# Patient Record
Sex: Female | Born: 1945 | Race: Black or African American | Hispanic: No | Smoking: Current every day smoker
Health system: Southern US, Community
[De-identification: ages and names within clinical notes are randomized; demographics above are authoritative.]

## PROBLEM LIST (undated history)

## (undated) DIAGNOSIS — I700 Atherosclerosis of aorta: Secondary | ICD-10-CM

## (undated) DIAGNOSIS — M109 Gout, unspecified: Secondary | ICD-10-CM

## (undated) DIAGNOSIS — I77810 Thoracic aortic ectasia: Secondary | ICD-10-CM

## (undated) DIAGNOSIS — R911 Solitary pulmonary nodule: Secondary | ICD-10-CM

## (undated) DIAGNOSIS — E559 Vitamin D deficiency, unspecified: Secondary | ICD-10-CM

## (undated) DIAGNOSIS — K635 Polyp of colon: Secondary | ICD-10-CM

## (undated) DIAGNOSIS — R0602 Shortness of breath: Secondary | ICD-10-CM

## (undated) DIAGNOSIS — I442 Atrioventricular block, complete: Secondary | ICD-10-CM

## (undated) DIAGNOSIS — R0600 Dyspnea, unspecified: Secondary | ICD-10-CM

## (undated) DIAGNOSIS — Z87898 Personal history of other specified conditions: Secondary | ICD-10-CM

## (undated) DIAGNOSIS — Z950 Presence of cardiac pacemaker: Secondary | ICD-10-CM

## (undated) DIAGNOSIS — R011 Cardiac murmur, unspecified: Secondary | ICD-10-CM

## (undated) DIAGNOSIS — I6523 Occlusion and stenosis of bilateral carotid arteries: Secondary | ICD-10-CM

## (undated) DIAGNOSIS — Z7982 Long term (current) use of aspirin: Secondary | ICD-10-CM

## (undated) DIAGNOSIS — M48061 Spinal stenosis, lumbar region without neurogenic claudication: Secondary | ICD-10-CM

## (undated) DIAGNOSIS — I10 Essential (primary) hypertension: Secondary | ICD-10-CM

## (undated) DIAGNOSIS — M1990 Unspecified osteoarthritis, unspecified site: Secondary | ICD-10-CM

## (undated) DIAGNOSIS — R0609 Other forms of dyspnea: Secondary | ICD-10-CM

## (undated) DIAGNOSIS — H269 Unspecified cataract: Secondary | ICD-10-CM

## (undated) DIAGNOSIS — J309 Allergic rhinitis, unspecified: Secondary | ICD-10-CM

## (undated) DIAGNOSIS — K118 Other diseases of salivary glands: Secondary | ICD-10-CM

## (undated) DIAGNOSIS — I428 Other cardiomyopathies: Secondary | ICD-10-CM

## (undated) DIAGNOSIS — E785 Hyperlipidemia, unspecified: Secondary | ICD-10-CM

## (undated) DIAGNOSIS — C801 Malignant (primary) neoplasm, unspecified: Secondary | ICD-10-CM

## (undated) DIAGNOSIS — I2510 Atherosclerotic heart disease of native coronary artery without angina pectoris: Secondary | ICD-10-CM

## (undated) DIAGNOSIS — E041 Nontoxic single thyroid nodule: Secondary | ICD-10-CM

## (undated) DIAGNOSIS — K219 Gastro-esophageal reflux disease without esophagitis: Secondary | ICD-10-CM

## (undated) DIAGNOSIS — D649 Anemia, unspecified: Secondary | ICD-10-CM

## (undated) DIAGNOSIS — F17200 Nicotine dependence, unspecified, uncomplicated: Secondary | ICD-10-CM

## (undated) HISTORY — DX: Cardiac murmur, unspecified: R01.1

## (undated) HISTORY — PX: TUBAL LIGATION: SHX77

## (undated) HISTORY — PX: APPENDECTOMY: SHX54

## (undated) HISTORY — DX: Unspecified osteoarthritis, unspecified site: M19.90

## (undated) HISTORY — DX: Essential (primary) hypertension: I10

## (undated) HISTORY — DX: Hyperlipidemia, unspecified: E78.5

## (undated) HISTORY — PX: WISDOM TOOTH EXTRACTION: SHX21

## (undated) HISTORY — DX: Personal history of other specified conditions: Z87.898

---

## 2011-06-29 ENCOUNTER — Ambulatory Visit (INDEPENDENT_AMBULATORY_CARE_PROVIDER_SITE_OTHER): Payer: BC Managed Care – PPO | Admitting: Family Medicine

## 2011-06-29 ENCOUNTER — Encounter: Payer: Self-pay | Admitting: Family Medicine

## 2011-06-29 ENCOUNTER — Other Ambulatory Visit (HOSPITAL_COMMUNITY)
Admission: RE | Admit: 2011-06-29 | Discharge: 2011-06-29 | Disposition: A | Discharge status: Home / Self Care | Payer: Medicare Other | Source: Ambulatory Visit | Attending: Family Medicine | Admitting: Family Medicine

## 2011-06-29 VITALS — BP 101/77 | HR 78 | Ht 62.0 in | Wt 202.0 lb

## 2011-06-29 DIAGNOSIS — Z1159 Encounter for screening for other viral diseases: Secondary | ICD-10-CM | POA: Insufficient documentation

## 2011-06-29 DIAGNOSIS — Z87891 Personal history of nicotine dependence: Secondary | ICD-10-CM | POA: Insufficient documentation

## 2011-06-29 DIAGNOSIS — E669 Obesity, unspecified: Secondary | ICD-10-CM | POA: Insufficient documentation

## 2011-06-29 DIAGNOSIS — Z01419 Encounter for gynecological examination (general) (routine) without abnormal findings: Secondary | ICD-10-CM

## 2011-06-29 DIAGNOSIS — R011 Cardiac murmur, unspecified: Secondary | ICD-10-CM | POA: Insufficient documentation

## 2011-06-29 DIAGNOSIS — Z124 Encounter for screening for malignant neoplasm of cervix: Secondary | ICD-10-CM | POA: Insufficient documentation

## 2011-06-29 DIAGNOSIS — I10 Essential (primary) hypertension: Secondary | ICD-10-CM | POA: Insufficient documentation

## 2011-06-29 DIAGNOSIS — E7800 Pure hypercholesterolemia, unspecified: Secondary | ICD-10-CM | POA: Insufficient documentation

## 2011-06-29 DIAGNOSIS — M1990 Unspecified osteoarthritis, unspecified site: Secondary | ICD-10-CM | POA: Insufficient documentation

## 2011-06-29 NOTE — Patient Instructions (Signed)
Preventive Care for Adults, Female A healthy lifestyle and preventive care can promote health and wellness. Preventive health guidelines for women include the following key practices.  A routine yearly physical is a good way to check with your caregiver about your health and preventive screening. It is a chance to share any concerns and updates on your health, and to receive a thorough exam.   Visit your dentist for a routine exam and preventive care every 6 months. Brush your teeth twice a day and floss once a day. Good oral hygiene prevents tooth decay and gum disease.   The frequency of eye exams is based on your age, health, family medical history, use of contact lenses, and other factors. Follow your caregiver's recommendations for frequency of eye exams.   Eat a healthy diet. Foods like vegetables, fruits, whole grains, low-fat dairy products, and lean protein foods contain the nutrients you need without too many calories. Decrease your intake of foods high in solid fats, added sugars, and salt. Eat the right amount of calories for you.Get information about a proper diet from your caregiver, if necessary.   Regular physical exercise is one of the most important things you can do for your health. Most adults should get at least 150 minutes of moderate-intensity exercise (any activity that increases your heart rate and causes you to sweat) each week. In addition, most adults need muscle-strengthening exercises on 2 or more days a week.   Maintain a healthy weight. The body mass index (BMI) is a screening tool to identify possible weight problems. It provides an estimate of body fat based on height and weight. Your caregiver can help determine your BMI, and can help you achieve or maintain a healthy weight.For adults 20 years and older:   A BMI below 18.5 is considered underweight.   A BMI of 18.5 to 24.9 is normal.   A BMI of 25 to 29.9 is considered overweight.   A BMI of 30 and above is  considered obese.   Maintain normal blood lipids and cholesterol levels by exercising and minimizing your intake of saturated fat. Eat a balanced diet with plenty of fruit and vegetables. Blood tests for lipids and cholesterol should begin at age 20 and be repeated every 5 years. If your lipid or cholesterol levels are high, you are over 50, or you are at high risk for heart disease, you may need your cholesterol levels checked more frequently.Ongoing high lipid and cholesterol levels should be treated with medicines if diet and exercise are not effective.   If you smoke, find out from your caregiver how to quit. If you do not use tobacco, do not start.   If you are pregnant, do not drink alcohol. If you are breastfeeding, be very cautious about drinking alcohol. If you are not pregnant and choose to drink alcohol, do not exceed 1 drink per day. One drink is considered to be 12 ounces (355 mL) of beer, 5 ounces (148 mL) of wine, or 1.5 ounces (44 mL) of liquor.   Avoid use of street drugs. Do not share needles with anyone. Ask for help if you need support or instructions about stopping the use of drugs.   High blood pressure causes heart disease and increases the risk of stroke. Your blood pressure should be checked at least every 1 to 2 years. Ongoing high blood pressure should be treated with medicines if weight loss and exercise are not effective.   If you are 55 to 65   years old, ask your caregiver if you should take aspirin to prevent strokes.   Diabetes screening involves taking a blood sample to check your fasting blood sugar level. This should be done once every 3 years, after age 45, if you are within normal weight and without risk factors for diabetes. Testing should be considered at a younger age or be carried out more frequently if you are overweight and have at least 1 risk factor for diabetes.   Breast cancer screening is essential preventive care for women. You should practice "breast  self-awareness." This means understanding the normal appearance and feel of your breasts and may include breast self-examination. Any changes detected, no matter how small, should be reported to a caregiver. Women in their 20s and 30s should have a clinical breast exam (CBE) by a caregiver as part of a regular health exam every 1 to 3 years. After age 40, women should have a CBE every year. Starting at age 40, women should consider having a mammography (breast X-ray test) every year. Women who have a family history of breast cancer should talk to their caregiver about genetic screening. Women at a high risk of breast cancer should talk to their caregivers about having magnetic resonance imaging (MRI) and a mammography every year.   The Pap test is a screening test for cervical cancer. A Pap test can show cell changes on the cervix that might become cervical cancer if left untreated. A Pap test is a procedure in which cells are obtained and examined from the lower end of the uterus (cervix).   Women should have a Pap test starting at age 21.   Between ages 21 and 29, Pap tests should be repeated every 2 years.   Beginning at age 30, you should have a Pap test every 3 years as long as the past 3 Pap tests have been normal.   Some women have medical problems that increase the chance of getting cervical cancer. Talk to your caregiver about these problems. It is especially important to talk to your caregiver if a new problem develops soon after your last Pap test. In these cases, your caregiver may recommend more frequent screening and Pap tests.   The above recommendations are the same for women who have or have not gotten the vaccine for human papillomavirus (HPV).   If you had a hysterectomy for a problem that was not cancer or a condition that could lead to cancer, then you no longer need Pap tests. Even if you no longer need a Pap test, a regular exam is a good idea to make sure no other problems are  starting.   If you are between ages 65 and 70, and you have had normal Pap tests going back 10 years, you no longer need Pap tests. Even if you no longer need a Pap test, a regular exam is a good idea to make sure no other problems are starting.   If you have had past treatment for cervical cancer or a condition that could lead to cancer, you need Pap tests and screening for cancer for at least 20 years after your treatment.   If Pap tests have been discontinued, risk factors (such as a new sexual partner) need to be reassessed to determine if screening should be resumed.   The HPV test is an additional test that may be used for cervical cancer screening. The HPV test looks for the virus that can cause the cell changes on the cervix.   The cells collected during the Pap test can be tested for HPV. The HPV test could be used to screen women aged 30 years and older, and should be used in women of any age who have unclear Pap test results. After the age of 30, women should have HPV testing at the same frequency as a Pap test.   Colorectal cancer can be detected and often prevented. Most routine colorectal cancer screening begins at the age of 50 and continues through age 75. However, your caregiver may recommend screening at an earlier age if you have risk factors for colon cancer. On a yearly basis, your caregiver may provide home test kits to check for hidden blood in the stool. Use of a small camera at the end of a tube, to directly examine the colon (sigmoidoscopy or colonoscopy), can detect the earliest forms of colorectal cancer. Talk to your caregiver about this at age 50, when routine screening begins. Direct examination of the colon should be repeated every 5 to 10 years through age 75, unless early forms of pre-cancerous polyps or small growths are found.   Hepatitis C blood testing is recommended for all people born from 1945 through 1965 and any individual with known risks for hepatitis C.    Practice safe sex. Use condoms and avoid high-risk sexual practices to reduce the spread of sexually transmitted infections (STIs). STIs include gonorrhea, chlamydia, syphilis, trichomonas, herpes, HPV, and human immunodeficiency virus (HIV). Herpes, HIV, and HPV are viral illnesses that have no cure. They can result in disability, cancer, and death. Sexually active women aged 25 and younger should be checked for chlamydia. Older women with new or multiple partners should also be tested for chlamydia. Testing for other STIs is recommended if you are sexually active and at increased risk.   Osteoporosis is a disease in which the bones lose minerals and strength with aging. This can result in serious bone fractures. The risk of osteoporosis can be identified using a bone density scan. Women ages 65 and over and women at risk for fractures or osteoporosis should discuss screening with their caregivers. Ask your caregiver whether you should take a calcium supplement or vitamin D to reduce the rate of osteoporosis.   Menopause can be associated with physical symptoms and risks. Hormone replacement therapy is available to decrease symptoms and risks. You should talk to your caregiver about whether hormone replacement therapy is right for you.   Use sunscreen with sun protection factor (SPF) of 30 or more. Apply sunscreen liberally and repeatedly throughout the day. You should seek shade when your shadow is shorter than you. Protect yourself by wearing long sleeves, pants, a wide-brimmed hat, and sunglasses year round, whenever you are outdoors.   Once a month, do a whole body skin exam, using a mirror to look at the skin on your back. Notify your caregiver of new moles, moles that have irregular borders, moles that are larger than a pencil eraser, or moles that have changed in shape or color.   Stay current with required immunizations.   Influenza. You need a dose every fall (or winter). The composition of  the flu vaccine changes each year, so being vaccinated once is not enough.   Pneumococcal polysaccharide. You need 1 to 2 doses if you smoke cigarettes or if you have certain chronic medical conditions. You need 1 dose at age 65 (or older) if you have never been vaccinated.   Tetanus, diphtheria, pertussis (Tdap, Td). Get 1 dose of   Tdap vaccine if you are younger than age 65, are over 65 and have contact with an infant, are a healthcare worker, are pregnant, or simply want to be protected from whooping cough. After that, you need a Td booster dose every 10 years. Consult your caregiver if you have not had at least 3 tetanus and diphtheria-containing shots sometime in your life or have a deep or dirty wound.   HPV. You need this vaccine if you are a woman age 26 or younger. The vaccine is given in 3 doses over 6 months.   Measles, mumps, rubella (MMR). You need at least 1 dose of MMR if you were born in 1957 or later. You may also need a second dose.   Meningococcal. If you are age 19 to 21 and a first-year college student living in a residence hall, or have one of several medical conditions, you need to get vaccinated against meningococcal disease. You may also need additional booster doses.   Zoster (shingles). If you are age 60 or older, you should get this vaccine.   Varicella (chickenpox). If you have never had chickenpox or you were vaccinated but received only 1 dose, talk to your caregiver to find out if you need this vaccine.   Hepatitis A. You need this vaccine if you have a specific risk factor for hepatitis A virus infection or you simply wish to be protected from this disease. The vaccine is usually given as 2 doses, 6 to 18 months apart.   Hepatitis B. You need this vaccine if you have a specific risk factor for hepatitis B virus infection or you simply wish to be protected from this disease. The vaccine is given in 3 doses, usually over 6 months.  Preventive Services /  Frequency Ages 19 to 39  Blood pressure check.** / Every 1 to 2 years.   Lipid and cholesterol check.** / Every 5 years beginning at age 20.   Clinical breast exam.** / Every 3 years for women in their 20s and 30s.   Pap test.** / Every 2 years from ages 21 through 29. Every 3 years starting at age 30 through age 65 or 70 with a history of 3 consecutive normal Pap tests.   HPV screening.** / Every 3 years from ages 30 through ages 65 to 70 with a history of 3 consecutive normal Pap tests.   Hepatitis C blood test.** / For any individual with known risks for hepatitis C.   Skin self-exam. / Monthly.   Influenza immunization.** / Every year.   Pneumococcal polysaccharide immunization.** / 1 to 2 doses if you smoke cigarettes or if you have certain chronic medical conditions.   Tetanus, diphtheria, pertussis (Tdap, Td) immunization. / A one-time dose of Tdap vaccine. After that, you need a Td booster dose every 10 years.   HPV immunization. / 3 doses over 6 months, if you are 26 and younger.   Measles, mumps, rubella (MMR) immunization. / You need at least 1 dose of MMR if you were born in 1957 or later. You may also need a second dose.   Meningococcal immunization. / 1 dose if you are age 19 to 21 and a first-year college student living in a residence hall, or have one of several medical conditions, you need to get vaccinated against meningococcal disease. You may also need additional booster doses.   Varicella immunization.** / Consult your caregiver.   Hepatitis A immunization.** / Consult your caregiver. 2 doses, 6 to 18 months   apart.   Hepatitis B immunization.** / Consult your caregiver. 3 doses usually over 6 months.  Ages 40 to 64  Blood pressure check.** / Every 1 to 2 years.   Lipid and cholesterol check.** / Every 5 years beginning at age 20.   Clinical breast exam.** / Every year after age 40.   Mammogram.** / Every year beginning at age 40 and continuing for as  long as you are in good health. Consult with your caregiver.   Pap test.** / Every 3 years starting at age 30 through age 65 or 70 with a history of 3 consecutive normal Pap tests.   HPV screening.** / Every 3 years from ages 30 through ages 65 to 70 with a history of 3 consecutive normal Pap tests.   Fecal occult blood test (FOBT) of stool. / Every year beginning at age 50 and continuing until age 75. You may not need to do this test if you get a colonoscopy every 10 years.   Flexible sigmoidoscopy or colonoscopy.** / Every 5 years for a flexible sigmoidoscopy or every 10 years for a colonoscopy beginning at age 50 and continuing until age 75.   Hepatitis C blood test.** / For all people born from 1945 through 1965 and any individual with known risks for hepatitis C.   Skin self-exam. / Monthly.   Influenza immunization.** / Every year.   Pneumococcal polysaccharide immunization.** / 1 to 2 doses if you smoke cigarettes or if you have certain chronic medical conditions.   Tetanus, diphtheria, pertussis (Tdap, Td) immunization.** / A one-time dose of Tdap vaccine. After that, you need a Td booster dose every 10 years.   Measles, mumps, rubella (MMR) immunization. / You need at least 1 dose of MMR if you were born in 1957 or later. You may also need a second dose.   Varicella immunization.** / Consult your caregiver.   Meningococcal immunization.** / Consult your caregiver.   Hepatitis A immunization.** / Consult your caregiver. 2 doses, 6 to 18 months apart.   Hepatitis B immunization.** / Consult your caregiver. 3 doses, usually over 6 months.  Ages 65 and over  Blood pressure check.** / Every 1 to 2 years.   Lipid and cholesterol check.** / Every 5 years beginning at age 20.   Clinical breast exam.** / Every year after age 40.   Mammogram.** / Every year beginning at age 40 and continuing for as long as you are in good health. Consult with your caregiver.   Pap test.** /  Every 3 years starting at age 30 through age 65 or 70 with a 3 consecutive normal Pap tests. Testing can be stopped between 65 and 70 with 3 consecutive normal Pap tests and no abnormal Pap or HPV tests in the past 10 years.   HPV screening.** / Every 3 years from ages 30 through ages 65 or 70 with a history of 3 consecutive normal Pap tests. Testing can be stopped between 65 and 70 with 3 consecutive normal Pap tests and no abnormal Pap or HPV tests in the past 10 years.   Fecal occult blood test (FOBT) of stool. / Every year beginning at age 50 and continuing until age 75. You may not need to do this test if you get a colonoscopy every 10 years.   Flexible sigmoidoscopy or colonoscopy.** / Every 5 years for a flexible sigmoidoscopy or every 10 years for a colonoscopy beginning at age 50 and continuing until age 75.   Hepatitis   C blood test.** / For all people born from 1945 through 1965 and any individual with known risks for hepatitis C.   Osteoporosis screening.** / A one-time screening for women ages 65 and over and women at risk for fractures or osteoporosis.   Skin self-exam. / Monthly.   Influenza immunization.** / Every year.   Pneumococcal polysaccharide immunization.** / 1 dose at age 65 (or older) if you have never been vaccinated.   Tetanus, diphtheria, pertussis (Tdap, Td) immunization. / A one-time dose of Tdap vaccine if you are over 65 and have contact with an infant, are a healthcare worker, or simply want to be protected from whooping cough. After that, you need a Td booster dose every 10 years.   Varicella immunization.** / Consult your caregiver.   Meningococcal immunization.** / Consult your caregiver.   Hepatitis A immunization.** / Consult your caregiver. 2 doses, 6 to 18 months apart.   Hepatitis B immunization.** / Check with your caregiver. 3 doses, usually over 6 months.  ** Family history and personal history of risk and conditions may change your caregiver's  recommendations. Document Released: 03/15/2001 Document Revised: 01/06/2011 Document Reviewed: 06/14/2010 [employer] Patient Information 2012 [employer]. 

## 2011-06-29 NOTE — Progress Notes (Signed)
  Subjective:     Brenda Combs is a 66 y.o. female and is here for a comprehensive physical exam. The patient reports quitting smoking and being on Chantix presently, recently retired and has moved in with her daughter.  Has PCP who monitors her blood work, manages her BP.  She is scheduled for mammogram on July 07, 2011.  Last colonoscopy was 5 years ago.Marland Kitchen  History   Social History  . Marital Status: Widowed    Spouse Name: N/A    Number of Children: N/A  . Years of Education: N/A   Occupational History  . Not on file.   Social History Main Topics  . Smoking status: Former Games developer  . Smokeless tobacco: Not on file  . Alcohol Use: No  . Drug Use: No  . Sexually Active: Not Currently    Birth Control/ Protection: Post-menopausal     widow since 2000   Other Topics Concern  . Not on file   Social History Narrative  . No narrative on file   No health maintenance topics applied.  The following portions of the patient's history were reviewed and updated as appropriate: allergies, current medications, past family history, past medical history, past social history, past surgical history and problem list.  Review of Systems A comprehensive review of systems was negative.   Objective:    BP 101/77  Pulse 78  Ht 5\' 2"  (1.575 m)  Wt 202 lb (91.627 kg)  BMI 36.95 kg/m2 General appearance: alert, cooperative and appears stated age Head: Normocephalic, without obvious abnormality, atraumatic Neck: no adenopathy, supple, symmetrical, trachea midline and thyroid not enlarged, symmetric, no tenderness/mass/nodules Lungs: clear to auscultation bilaterally Breasts: normal appearance, no masses or tenderness Heart: regular rate and rhythm, S1, S2 normal, no murmur, click, rub or gallop Abdomen: soft, non-tender; bowel sounds normal; no masses,  no organomegaly Pelvic: cervix normal in appearance, external genitalia normal, no adnexal masses or tenderness, no cervical motion  tenderness, uterus normal size, shape, and consistency, vagina normal without discharge and atrophic Extremities: extremities normal, atraumatic, no cyanosis or edema Pulses: 2+ and symmetric Skin: Skin color, texture, turgor normal. No rashes or lesions Lymph nodes: Cervical, supraclavicular, and axillary nodes normal. Neurologic: Grossly normal    Assessment:    Healthy female exam. Postmenopausal.     Plan:    Pap smear today PCP will manage other issues. See After Visit Summary for Counseling Recommendations

## 2011-07-07 ENCOUNTER — Ambulatory Visit: Payer: Self-pay

## 2012-07-30 ENCOUNTER — Ambulatory Visit: Payer: Self-pay

## 2012-08-01 ENCOUNTER — Ambulatory Visit: Payer: Self-pay | Admitting: Gastroenterology

## 2012-08-02 LAB — PATHOLOGY REPORT

## 2013-05-24 ENCOUNTER — Ambulatory Visit: Payer: Self-pay | Admitting: Family Medicine

## 2013-06-05 ENCOUNTER — Ambulatory Visit: Payer: Self-pay | Admitting: Family Medicine

## 2013-08-23 ENCOUNTER — Ambulatory Visit: Payer: Self-pay | Admitting: Family Medicine

## 2013-11-20 DIAGNOSIS — M109 Gout, unspecified: Secondary | ICD-10-CM | POA: Insufficient documentation

## 2013-12-02 ENCOUNTER — Encounter: Payer: Self-pay | Admitting: Family Medicine

## 2014-09-11 ENCOUNTER — Other Ambulatory Visit: Payer: Self-pay | Admitting: Family Medicine

## 2014-09-11 DIAGNOSIS — Z1231 Encounter for screening mammogram for malignant neoplasm of breast: Secondary | ICD-10-CM

## 2014-09-17 ENCOUNTER — Other Ambulatory Visit: Payer: Self-pay | Admitting: Family Medicine

## 2014-09-17 ENCOUNTER — Ambulatory Visit
Admission: RE | Admit: 2014-09-17 | Discharge: 2014-09-17 | Disposition: A | Discharge status: Home / Self Care | Payer: Medicare PPO | Source: Ambulatory Visit | Attending: Family Medicine | Admitting: Family Medicine

## 2014-09-17 DIAGNOSIS — Z1231 Encounter for screening mammogram for malignant neoplasm of breast: Secondary | ICD-10-CM | POA: Diagnosis present

## 2015-04-13 DIAGNOSIS — K219 Gastro-esophageal reflux disease without esophagitis: Secondary | ICD-10-CM | POA: Insufficient documentation

## 2015-04-13 DIAGNOSIS — E559 Vitamin D deficiency, unspecified: Secondary | ICD-10-CM | POA: Insufficient documentation

## 2015-04-13 DIAGNOSIS — J309 Allergic rhinitis, unspecified: Secondary | ICD-10-CM | POA: Insufficient documentation

## 2015-04-13 DIAGNOSIS — E041 Nontoxic single thyroid nodule: Secondary | ICD-10-CM | POA: Insufficient documentation

## 2015-04-13 DIAGNOSIS — E876 Hypokalemia: Secondary | ICD-10-CM | POA: Insufficient documentation

## 2015-04-13 DIAGNOSIS — M8580 Other specified disorders of bone density and structure, unspecified site: Secondary | ICD-10-CM | POA: Insufficient documentation

## 2015-04-13 DIAGNOSIS — M722 Plantar fascial fibromatosis: Secondary | ICD-10-CM | POA: Insufficient documentation

## 2015-08-26 ENCOUNTER — Other Ambulatory Visit: Payer: Self-pay | Admitting: Family Medicine

## 2015-08-26 DIAGNOSIS — Z1231 Encounter for screening mammogram for malignant neoplasm of breast: Secondary | ICD-10-CM

## 2015-09-18 ENCOUNTER — Other Ambulatory Visit: Payer: Self-pay | Admitting: Family Medicine

## 2015-09-18 ENCOUNTER — Ambulatory Visit
Admission: RE | Admit: 2015-09-18 | Discharge: 2015-09-18 | Disposition: A | Discharge status: Home / Self Care | Payer: Medicare Other | Source: Ambulatory Visit | Attending: Family Medicine | Admitting: Family Medicine

## 2015-09-18 DIAGNOSIS — Z1231 Encounter for screening mammogram for malignant neoplasm of breast: Secondary | ICD-10-CM | POA: Diagnosis present

## 2015-10-23 ENCOUNTER — Ambulatory Visit: Payer: Medicare Other | Admitting: Family Medicine

## 2015-10-23 ENCOUNTER — Encounter: Payer: Self-pay | Admitting: Family Medicine

## 2015-10-23 ENCOUNTER — Encounter: Payer: Self-pay | Admitting: *Deleted

## 2015-10-23 VITALS — BP 160/88 | HR 105 | Wt 202.0 lb

## 2015-10-23 DIAGNOSIS — Z719 Counseling, unspecified: Secondary | ICD-10-CM

## 2015-10-23 NOTE — Patient Instructions (Signed)
Preventive Care for Adults, Female A healthy lifestyle and preventive care can promote health and wellness. Preventive health guidelines for women include the following key practices.  A routine yearly physical is a good way to check with your health care provider about your health and preventive screening. It is a chance to share any concerns and updates on your health and to receive a thorough exam.  Visit your dentist for a routine exam and preventive care every 6 months. Brush your teeth twice a day and floss once a day. Good oral hygiene prevents tooth decay and gum disease.  The frequency of eye exams is based on your age, health, family medical history, use of contact lenses, and other factors. Follow your health care provider's recommendations for frequency of eye exams.  Eat a healthy diet. Foods like vegetables, fruits, whole grains, low-fat dairy products, and lean protein foods contain the nutrients you need without too many calories. Decrease your intake of foods high in solid fats, added sugars, and salt. Eat the right amount of calories for you.Get information about a proper diet from your health care provider, if necessary.  Regular physical exercise is one of the most important things you can do for your health. Most adults should get at least 150 minutes of moderate-intensity exercise (any activity that increases your heart rate and causes you to sweat) each week. In addition, most adults need muscle-strengthening exercises on 2 or more days a week.  Maintain a healthy weight. The body mass index (BMI) is a screening tool to identify possible weight problems. It provides an estimate of body fat based on height and weight. Your health care provider can find your BMI and can help you achieve or maintain a healthy weight.For adults 20 years and older:  A BMI below 18.5 is considered underweight.  A BMI of 18.5 to 24.9 is normal.  A BMI of 25 to 29.9 is considered overweight.  A  BMI of 30 and above is considered obese.  Maintain normal blood lipids and cholesterol levels by exercising and minimizing your intake of saturated fat. Eat a balanced diet with plenty of fruit and vegetables. Blood tests for lipids and cholesterol should begin at age 69 and be repeated every 5 years. If your lipid or cholesterol levels are high, you are over 50, or you are at high risk for heart disease, you may need your cholesterol levels checked more frequently.Ongoing high lipid and cholesterol levels should be treated with medicines if diet and exercise are not working.  If you smoke, find out from your health care provider how to quit. If you do not use tobacco, do not start.  Lung cancer screening is recommended for adults aged 45-80 years who are at high risk for developing lung cancer because of a history of smoking. A yearly low-dose CT scan of the lungs is recommended for people who have at least a 30-pack-year history of smoking and are a current smoker or have quit within the past 15 years. A pack year of smoking is smoking an average of 1 pack of cigarettes a day for 1 year (for example: 1 pack a day for 30 years or 2 packs a day for 15 years). Yearly screening should continue until the smoker has stopped smoking for at least 15 years. Yearly screening should be stopped for people who develop a health problem that would prevent them from having lung cancer treatment.  If you are pregnant, do not drink alcohol. If you are  breastfeeding, be very cautious about drinking alcohol. If you are not pregnant and choose to drink alcohol, do not have more than 1 drink per day. One drink is considered to be 12 ounces (355 mL) of beer, 5 ounces (148 mL) of wine, or 1.5 ounces (44 mL) of liquor.  Avoid use of street drugs. Do not share needles with anyone. Ask for help if you need support or instructions about stopping the use of drugs.  High blood pressure causes heart disease and increases the risk  of stroke. Your blood pressure should be checked at least every 1 to 2 years. Ongoing high blood pressure should be treated with medicines if weight loss and exercise do not work.  If you are 69-69 years old, ask your health care provider if you should take aspirin to prevent strokes.  Diabetes screening is done by taking a blood sample to check your blood glucose level after you have not eaten for a certain period of time (fasting). If you are not overweight and you do not have risk factors for diabetes, you should be screened once every 3 years starting at age 45. If you are overweight or obese and you are 69-69 years of age, you should be screened for diabetes every year as part of your cardiovascular risk assessment.  Breast cancer screening is essential preventive care for women. You should practice "breast self-awareness." This means understanding the normal appearance and feel of your breasts and may include breast self-examination. Any changes detected, no matter how small, should be reported to a health care provider. Women in their 20s and 30s should have a clinical breast exam (CBE) by a health care provider as part of a regular health exam every 1 to 3 years. After age 40, women should have a CBE every year. Starting at age 40, women should consider having a mammogram (breast X-ray test) every year. Women who have a family history of breast cancer should talk to their health care provider about genetic screening. Women at a high risk of breast cancer should talk to their health care providers about having an MRI and a mammogram every year.  Breast cancer gene (BRCA)-related cancer risk assessment is recommended for women who have family members with BRCA-related cancers. BRCA-related cancers include breast, ovarian, tubal, and peritoneal cancers. Having family members with these cancers may be associated with an increased risk for harmful changes (mutations) in the breast cancer genes BRCA1 and  BRCA2. Results of the assessment will determine the need for genetic counseling and BRCA1 and BRCA2 testing.  Your health care provider may recommend that you be screened regularly for cancer of the pelvic organs (ovaries, uterus, and vagina). This screening involves a pelvic examination, including checking for microscopic changes to the surface of your cervix (Pap test). You may be encouraged to have this screening done every 3 years, beginning at age 21.  For women ages 30-65, health care providers may recommend pelvic exams and Pap testing every 3 years, or they may recommend the Pap and pelvic exam, combined with testing for human papilloma virus (HPV), every 5 years. Some types of HPV increase your risk of cervical cancer. Testing for HPV may also be done on women of any age with unclear Pap test results.  Other health care providers may not recommend any screening for nonpregnant women who are considered low risk for pelvic cancer and who do not have symptoms. Ask your health care provider if a screening pelvic exam is right for   you.  If you have had past treatment for cervical cancer or a condition that could lead to cancer, you need Pap tests and screening for cancer for at least 20 years after your treatment. If Pap tests have been discontinued, your risk factors (such as having a new sexual partner) need to be reassessed to determine if screening should resume. Some women have medical problems that increase the chance of getting cervical cancer. In these cases, your health care provider may recommend more frequent screening and Pap tests.  Colorectal cancer can be detected and often prevented. Most routine colorectal cancer screening begins at the age of 50 years and continues through age 75 years. However, your health care provider may recommend screening at an earlier age if you have risk factors for colon cancer. On a yearly basis, your health care provider may provide home test kits to check  for hidden blood in the stool. Use of a small camera at the end of a tube, to directly examine the colon (sigmoidoscopy or colonoscopy), can detect the earliest forms of colorectal cancer. Talk to your health care provider about this at age 50, when routine screening begins. Direct exam of the colon should be repeated every 5-10 years through age 75 years, unless early forms of precancerous polyps or small growths are found.  People who are at an increased risk for hepatitis B should be screened for this virus. You are considered at high risk for hepatitis B if:  You were born in a country where hepatitis B occurs often. Talk with your health care provider about which countries are considered high risk.  Your parents were born in a high-risk country and you have not received a shot to protect against hepatitis B (hepatitis B vaccine).  You have HIV or AIDS.  You use needles to inject street drugs.  You live with, or have sex with, someone who has hepatitis B.  You get hemodialysis treatment.  You take certain medicines for conditions like cancer, organ transplantation, and autoimmune conditions.  Hepatitis C blood testing is recommended for all people born from 1945 through 1965 and any individual with known risks for hepatitis C.  Practice safe sex. Use condoms and avoid high-risk sexual practices to reduce the spread of sexually transmitted infections (STIs). STIs include gonorrhea, chlamydia, syphilis, trichomonas, herpes, HPV, and human immunodeficiency virus (HIV). Herpes, HIV, and HPV are viral illnesses that have no cure. They can result in disability, cancer, and death.  You should be screened for sexually transmitted illnesses (STIs) including gonorrhea and chlamydia if:  You are sexually active and are younger than 24 years.  You are older than 24 years and your health care provider tells you that you are at risk for this type of infection.  Your sexual activity has changed  since you were last screened and you are at an increased risk for chlamydia or gonorrhea. Ask your health care provider if you are at risk.  If you are at risk of being infected with HIV, it is recommended that you take a prescription medicine daily to prevent HIV infection. This is called preexposure prophylaxis (PrEP). You are considered at risk if:  You are sexually active and do not regularly use condoms or know the HIV status of your partner(s).  You take drugs by injection.  You are sexually active with a partner who has HIV.  Talk with your health care provider about whether you are at high risk of being infected with HIV. If   you choose to begin PrEP, you should first be tested for HIV. You should then be tested every 3 months for as long as you are taking PrEP.  Osteoporosis is a disease in which the bones lose minerals and strength with aging. This can result in serious bone fractures or breaks. The risk of osteoporosis can be identified using a bone density scan. Women ages 67 years and over and women at risk for fractures or osteoporosis should discuss screening with their health care providers. Ask your health care provider whether you should take a calcium supplement or vitamin D to reduce the rate of osteoporosis.  Menopause can be associated with physical symptoms and risks. Hormone replacement therapy is available to decrease symptoms and risks. You should talk to your health care provider about whether hormone replacement therapy is right for you.  Use sunscreen. Apply sunscreen liberally and repeatedly throughout the day. You should seek shade when your shadow is shorter than you. Protect yourself by wearing long sleeves, pants, a wide-brimmed hat, and sunglasses year round, whenever you are outdoors.  Once a month, do a whole body skin exam, using a mirror to look at the skin on your back. Tell your health care provider of new moles, moles that have irregular borders, moles that  are larger than a pencil eraser, or moles that have changed in shape or color.  Stay current with required vaccines (immunizations).  Influenza vaccine. All adults should be immunized every year.  Tetanus, diphtheria, and acellular pertussis (Td, Tdap) vaccine. Pregnant women should receive 1 dose of Tdap vaccine during each pregnancy. The dose should be obtained regardless of the length of time since the last dose. Immunization is preferred during the 27th-36th week of gestation. An adult who has not previously received Tdap or who does not know her vaccine status should receive 1 dose of Tdap. This initial dose should be followed by tetanus and diphtheria toxoids (Td) booster doses every 10 years. Adults with an unknown or incomplete history of completing a 3-dose immunization series with Td-containing vaccines should begin or complete a primary immunization series including a Tdap dose. Adults should receive a Td booster every 10 years.  Varicella vaccine. An adult without evidence of immunity to varicella should receive 2 doses or a second dose if she has previously received 1 dose. Pregnant females who do not have evidence of immunity should receive the first dose after pregnancy. This first dose should be obtained before leaving the health care facility. The second dose should be obtained 4-8 weeks after the first dose.  Human papillomavirus (HPV) vaccine. Females aged 13-26 years who have not received the vaccine previously should obtain the 3-dose series. The vaccine is not recommended for use in pregnant females. However, pregnancy testing is not needed before receiving a dose. If a female is found to be pregnant after receiving a dose, no treatment is needed. In that case, the remaining doses should be delayed until after the pregnancy. Immunization is recommended for any person with an immunocompromised condition through the age of 61 years if she did not get any or all doses earlier. During the  3-dose series, the second dose should be obtained 4-8 weeks after the first dose. The third dose should be obtained 24 weeks after the first dose and 16 weeks after the second dose.  Zoster vaccine. One dose is recommended for adults aged 30 years or older unless certain conditions are present.  Measles, mumps, and rubella (MMR) vaccine. Adults born  before 1957 generally are considered immune to measles and mumps. Adults born in 1957 or later should have 1 or more doses of MMR vaccine unless there is a contraindication to the vaccine or there is laboratory evidence of immunity to each of the three diseases. A routine second dose of MMR vaccine should be obtained at least 28 days after the first dose for students attending postsecondary schools, health care workers, or international travelers. People who received inactivated measles vaccine or an unknown type of measles vaccine during 1963-1967 should receive 2 doses of MMR vaccine. People who received inactivated mumps vaccine or an unknown type of mumps vaccine before 1979 and are at high risk for mumps infection should consider immunization with 2 doses of MMR vaccine. For females of childbearing age, rubella immunity should be determined. If there is no evidence of immunity, females who are not pregnant should be vaccinated. If there is no evidence of immunity, females who are pregnant should delay immunization until after pregnancy. Unvaccinated health care workers born before 1957 who lack laboratory evidence of measles, mumps, or rubella immunity or laboratory confirmation of disease should consider measles and mumps immunization with 2 doses of MMR vaccine or rubella immunization with 1 dose of MMR vaccine.  Pneumococcal 13-valent conjugate (PCV13) vaccine. When indicated, a person who is uncertain of his immunization history and has no record of immunization should receive the PCV13 vaccine. All adults 65 years of age and older should receive this  vaccine. An adult aged 19 years or older who has certain medical conditions and has not been previously immunized should receive 1 dose of PCV13 vaccine. This PCV13 should be followed with a dose of pneumococcal polysaccharide (PPSV23) vaccine. Adults who are at high risk for pneumococcal disease should obtain the PPSV23 vaccine at least 8 weeks after the dose of PCV13 vaccine. Adults older than 69 years of age who have normal immune system function should obtain the PPSV23 vaccine dose at least 1 year after the dose of PCV13 vaccine.  Pneumococcal polysaccharide (PPSV23) vaccine. When PCV13 is also indicated, PCV13 should be obtained first. All adults aged 65 years and older should be immunized. An adult younger than age 65 years who has certain medical conditions should be immunized. Any person who resides in a nursing home or long-term care facility should be immunized. An adult smoker should be immunized. People with an immunocompromised condition and certain other conditions should receive both PCV13 and PPSV23 vaccines. People with human immunodeficiency virus (HIV) infection should be immunized as soon as possible after diagnosis. Immunization during chemotherapy or radiation therapy should be avoided. Routine use of PPSV23 vaccine is not recommended for American Indians, [place] Natives, or people younger than 65 years unless there are medical conditions that require PPSV23 vaccine. When indicated, people who have unknown immunization and have no record of immunization should receive PPSV23 vaccine. One-time revaccination 5 years after the first dose of PPSV23 is recommended for people aged 19-64 years who have chronic kidney failure, nephrotic syndrome, asplenia, or immunocompromised conditions. People who received 1-2 doses of PPSV23 before age 65 years should receive another dose of PPSV23 vaccine at age 65 years or later if at least 5 years have passed since the previous dose. Doses of PPSV23 are not  needed for people immunized with PPSV23 at or after age 65 years.  Meningococcal vaccine. Adults with asplenia or persistent complement component deficiencies should receive 2 doses of quadrivalent meningococcal conjugate (MenACWY-D) vaccine. The doses should be obtained   at least 2 months apart. Microbiologists working with certain meningococcal bacteria, [place] recruits, people at risk during an outbreak, and people who travel to or live in countries with a high rate of meningitis should be immunized. A first-year college student up through age 34 years who is living in a residence hall should receive a dose if she did not receive a dose on or after her 16th birthday. Adults who have certain high-risk conditions should receive one or more doses of vaccine.  Hepatitis A vaccine. Adults who wish to be protected from this disease, have certain high-risk conditions, work with hepatitis A-infected animals, work in hepatitis A research labs, or travel to or work in countries with a high rate of hepatitis A should be immunized. Adults who were previously unvaccinated and who anticipate close contact with an international adoptee during the first 60 days after arrival in the [place] from a country with a high rate of hepatitis A should be immunized.  Hepatitis B vaccine. Adults who wish to be protected from this disease, have certain high-risk conditions, may be exposed to blood or other infectious body fluids, are household contacts or sex partners of hepatitis B positive people, are clients or workers in certain care facilities, or travel to or work in countries with a high rate of hepatitis B should be immunized.  Haemophilus influenzae type b (Hib) vaccine. A previously unvaccinated person with asplenia or sickle cell disease or having a scheduled splenectomy should receive 1 dose of Hib vaccine. Regardless of previous immunization, a recipient of a hematopoietic stem cell transplant should receive a  3-dose series 6-12 months after her successful transplant. Hib vaccine is not recommended for adults with HIV infection. Preventive Services / Frequency Ages 35 to 4 years  Blood pressure check.** / Every 3-5 years.  Lipid and cholesterol check.** / Every 5 years beginning at age 60.  Clinical breast exam.** / Every 3 years for women in their 71s and 10s.  BRCA-related cancer risk assessment.** / For women who have family members with a BRCA-related cancer (breast, ovarian, tubal, or peritoneal cancers).  Pap test.** / Every 2 years from ages 76 through 26. Every 3 years starting at age 61 through age 76 or 93 with a history of 3 consecutive normal Pap tests.  HPV screening.** / Every 3 years from ages 37 through ages 60 to 51 with a history of 3 consecutive normal Pap tests.  Hepatitis C blood test.** / For any individual with known risks for hepatitis C.  Skin self-exam. / Monthly.  Influenza vaccine. / Every year.  Tetanus, diphtheria, and acellular pertussis (Tdap, Td) vaccine.** / Consult your health care provider. Pregnant women should receive 1 dose of Tdap vaccine during each pregnancy. 1 dose of Td every 10 years.  Varicella vaccine.** / Consult your health care provider. Pregnant females who do not have evidence of immunity should receive the first dose after pregnancy.  HPV vaccine. / 3 doses over 6 months, if 93 and younger. The vaccine is not recommended for use in pregnant females. However, pregnancy testing is not needed before receiving a dose.  Measles, mumps, rubella (MMR) vaccine.** / You need at least 1 dose of MMR if you were born in 1957 or later. You may also need a 2nd dose. For females of childbearing age, rubella immunity should be determined. If there is no evidence of immunity, females who are not pregnant should be vaccinated. If there is no evidence of immunity, females who are  pregnant should delay immunization until after pregnancy.  Pneumococcal  13-valent conjugate (PCV13) vaccine.** / Consult your health care provider.  Pneumococcal polysaccharide (PPSV23) vaccine.** / 1 to 2 doses if you smoke cigarettes or if you have certain conditions.  Meningococcal vaccine.** / 1 dose if you are age 68 to 8 years and a Market researcher living in a residence hall, or have one of several medical conditions, you need to get vaccinated against meningococcal disease. You may also need additional booster doses.  Hepatitis A vaccine.** / Consult your health care provider.  Hepatitis B vaccine.** / Consult your health care provider.  Haemophilus influenzae type b (Hib) vaccine.** / Consult your health care provider. Ages 7 to 53 years  Blood pressure check.** / Every year.  Lipid and cholesterol check.** / Every 5 years beginning at age 25 years.  Lung cancer screening. / Every year if you are aged 11-80 years and have a 30-pack-year history of smoking and currently smoke or have quit within the past 15 years. Yearly screening is stopped once you have quit smoking for at least 15 years or develop a health problem that would prevent you from having lung cancer treatment.  Clinical breast exam.** / Every year after age 48 years.  BRCA-related cancer risk assessment.** / For women who have family members with a BRCA-related cancer (breast, ovarian, tubal, or peritoneal cancers).  Mammogram.** / Every year beginning at age 41 years and continuing for as long as you are in good health. Consult with your health care provider.  Pap test.** / Every 3 years starting at age 65 years through age 37 or 70 years with a history of 3 consecutive normal Pap tests.  HPV screening.** / Every 3 years from ages 72 years through ages 60 to 40 years with a history of 3 consecutive normal Pap tests.  Fecal occult blood test (FOBT) of stool. / Every year beginning at age 21 years and continuing until age 5 years. You may not need to do this test if you get  a colonoscopy every 10 years.  Flexible sigmoidoscopy or colonoscopy.** / Every 5 years for a flexible sigmoidoscopy or every 10 years for a colonoscopy beginning at age 35 years and continuing until age 48 years.  Hepatitis C blood test.** / For all people born from [birth year] through 1965 and any individual with known risks for hepatitis C.  Skin self-exam. / Monthly.  Influenza vaccine. / Every year.  Tetanus, diphtheria, and acellular pertussis (Tdap/Td) vaccine.** / Consult your health care provider. Pregnant women should receive 1 dose of Tdap vaccine during each pregnancy. 1 dose of Td every 10 years.  Varicella vaccine.** / Consult your health care provider. Pregnant females who do not have evidence of immunity should receive the first dose after pregnancy.  Zoster vaccine.** / 1 dose for adults aged 30 years or older.  Measles, mumps, rubella (MMR) vaccine.** / You need at least 1 dose of MMR if you were born in 1957 or later. You may also need a second dose. For females of childbearing age, rubella immunity should be determined. If there is no evidence of immunity, females who are not pregnant should be vaccinated. If there is no evidence of immunity, females who are pregnant should delay immunization until after pregnancy.  Pneumococcal 13-valent conjugate (PCV13) vaccine.** / Consult your health care provider.  Pneumococcal polysaccharide (PPSV23) vaccine.** / 1 to 2 doses if you smoke cigarettes or if you have certain conditions.  Meningococcal vaccine.** /  Consult your health care provider.  Hepatitis A vaccine.** / Consult your health care provider.  Hepatitis B vaccine.** / Consult your health care provider.  Haemophilus influenzae type b (Hib) vaccine.** / Consult your health care provider. Ages 64 years and over  Blood pressure check.** / Every year.  Lipid and cholesterol check.** / Every 5 years beginning at age 23 years.  Lung cancer screening. / Every year if you  are aged 16-80 years and have a 30-pack-year history of smoking and currently smoke or have quit within the past 15 years. Yearly screening is stopped once you have quit smoking for at least 15 years or develop a health problem that would prevent you from having lung cancer treatment.  Clinical breast exam.** / Every year after age 74 years.  BRCA-related cancer risk assessment.** / For women who have family members with a BRCA-related cancer (breast, ovarian, tubal, or peritoneal cancers).  Mammogram.** / Every year beginning at age 44 years and continuing for as long as you are in good health. Consult with your health care provider.  Pap test.** / Every 3 years starting at age 58 years through age 22 or 39 years with 3 consecutive normal Pap tests. Testing can be stopped between 65 and 70 years with 3 consecutive normal Pap tests and no abnormal Pap or HPV tests in the past 10 years.  HPV screening.** / Every 3 years from ages 64 years through ages 70 or 61 years with a history of 3 consecutive normal Pap tests. Testing can be stopped between 65 and 70 years with 3 consecutive normal Pap tests and no abnormal Pap or HPV tests in the past 10 years.  Fecal occult blood test (FOBT) of stool. / Every year beginning at age 40 years and continuing until age 27 years. You may not need to do this test if you get a colonoscopy every 10 years.  Flexible sigmoidoscopy or colonoscopy.** / Every 5 years for a flexible sigmoidoscopy or every 10 years for a colonoscopy beginning at age 7 years and continuing until age 32 years.  Hepatitis C blood test.** / For all people born from [birth year] through 1965 and any individual with known risks for hepatitis C.  Osteoporosis screening.** / A one-time screening for women ages 30 years and over and women at risk for fractures or osteoporosis.  Skin self-exam. / Monthly.  Influenza vaccine. / Every year.  Tetanus, diphtheria, and acellular pertussis (Tdap/Td)  vaccine.** / 1 dose of Td every 10 years.  Varicella vaccine.** / Consult your health care provider.  Zoster vaccine.** / 1 dose for adults aged 35 years or older.  Pneumococcal 13-valent conjugate (PCV13) vaccine.** / Consult your health care provider.  Pneumococcal polysaccharide (PPSV23) vaccine.** / 1 dose for all adults aged 46 years and older.  Meningococcal vaccine.** / Consult your health care provider.  Hepatitis A vaccine.** / Consult your health care provider.  Hepatitis B vaccine.** / Consult your health care provider.  Haemophilus influenzae type b (Hib) vaccine.** / Consult your health care provider. ** Family history and personal history of risk and conditions may change your health care provider's recommendations.   This information is not intended to replace advice given to you by your health care provider. Make sure you discuss any questions you have with your health care provider.   Document Released: 03/15/2001 Document Revised: 02/07/2014 Document Reviewed: 06/14/2010 Elsevier Interactive Patient Education [employer].

## 2015-10-23 NOTE — Progress Notes (Signed)
Patient arrived thinking she needed an annual exam. Her last pap at age 70 was normal with Neg HPV. She is a retired Marine scientist. She is living with her daughter and enjoying her retirement with her grandchildren. She has a colonoscopy and EGD scheduled for next month. Last mammogram was in 09/2015 and was normal. No further pap smears needed. F/u with PCP prn.

## 2015-11-20 ENCOUNTER — Encounter: Payer: Self-pay | Admitting: *Deleted

## 2015-11-23 ENCOUNTER — Ambulatory Visit: Payer: Medicare Other | Admitting: Anesthesiology

## 2015-11-23 ENCOUNTER — Encounter
Admission: RE | Disposition: A | Discharge status: Home / Self Care | Payer: Self-pay | Source: Ambulatory Visit | Attending: Unknown Physician Specialty

## 2015-11-23 ENCOUNTER — Ambulatory Visit
Admission: RE | Admit: 2015-11-23 | Discharge: 2015-11-23 | Disposition: A | Discharge status: Home / Self Care | Payer: Medicare Other | Source: Ambulatory Visit | Attending: Unknown Physician Specialty | Admitting: Unknown Physician Specialty

## 2015-11-23 ENCOUNTER — Encounter: Payer: Self-pay | Admitting: *Deleted

## 2015-11-23 DIAGNOSIS — D125 Benign neoplasm of sigmoid colon: Secondary | ICD-10-CM | POA: Insufficient documentation

## 2015-11-23 DIAGNOSIS — E669 Obesity, unspecified: Secondary | ICD-10-CM | POA: Insufficient documentation

## 2015-11-23 DIAGNOSIS — Z86010 Personal history of colonic polyps: Secondary | ICD-10-CM | POA: Insufficient documentation

## 2015-11-23 DIAGNOSIS — Z888 Allergy status to other drugs, medicaments and biological substances status: Secondary | ICD-10-CM | POA: Diagnosis not present

## 2015-11-23 DIAGNOSIS — D124 Benign neoplasm of descending colon: Secondary | ICD-10-CM | POA: Diagnosis not present

## 2015-11-23 DIAGNOSIS — Z6836 Body mass index (BMI) 36.0-36.9, adult: Secondary | ICD-10-CM | POA: Diagnosis not present

## 2015-11-23 DIAGNOSIS — I10 Essential (primary) hypertension: Secondary | ICD-10-CM | POA: Diagnosis not present

## 2015-11-23 DIAGNOSIS — F17210 Nicotine dependence, cigarettes, uncomplicated: Secondary | ICD-10-CM | POA: Diagnosis not present

## 2015-11-23 DIAGNOSIS — K449 Diaphragmatic hernia without obstruction or gangrene: Secondary | ICD-10-CM | POA: Diagnosis not present

## 2015-11-23 DIAGNOSIS — K219 Gastro-esophageal reflux disease without esophagitis: Secondary | ICD-10-CM | POA: Insufficient documentation

## 2015-11-23 DIAGNOSIS — Z881 Allergy status to other antibiotic agents status: Secondary | ICD-10-CM | POA: Insufficient documentation

## 2015-11-23 DIAGNOSIS — Z880 Allergy status to penicillin: Secondary | ICD-10-CM | POA: Diagnosis not present

## 2015-11-23 DIAGNOSIS — K640 First degree hemorrhoids: Secondary | ICD-10-CM | POA: Insufficient documentation

## 2015-11-23 DIAGNOSIS — K5730 Diverticulosis of large intestine without perforation or abscess without bleeding: Secondary | ICD-10-CM | POA: Insufficient documentation

## 2015-11-23 DIAGNOSIS — R12 Heartburn: Secondary | ICD-10-CM | POA: Diagnosis not present

## 2015-11-23 DIAGNOSIS — K635 Polyp of colon: Secondary | ICD-10-CM | POA: Diagnosis not present

## 2015-11-23 DIAGNOSIS — Z1211 Encounter for screening for malignant neoplasm of colon: Secondary | ICD-10-CM | POA: Diagnosis not present

## 2015-11-23 DIAGNOSIS — D123 Benign neoplasm of transverse colon: Secondary | ICD-10-CM | POA: Insufficient documentation

## 2015-11-23 HISTORY — DX: Gastro-esophageal reflux disease without esophagitis: K21.9

## 2015-11-23 HISTORY — PX: ESOPHAGOGASTRODUODENOSCOPY (EGD) WITH PROPOFOL: SHX5813

## 2015-11-23 HISTORY — PX: COLONOSCOPY WITH PROPOFOL: SHX5780

## 2015-11-23 HISTORY — DX: Gout, unspecified: M10.9

## 2015-11-23 SURGERY — COLONOSCOPY WITH PROPOFOL
Anesthesia: General

## 2015-11-23 MED ORDER — PROPOFOL 500 MG/50ML IV EMUL
INTRAVENOUS | Status: DC | PRN
  Administered 2015-11-23: 120 ug/kg/min via INTRAVENOUS

## 2015-11-23 MED ORDER — PROPOFOL 10 MG/ML IV BOLUS
INTRAVENOUS | Status: DC | PRN
  Administered 2015-11-23: 90 mg via INTRAVENOUS

## 2015-11-23 MED ORDER — MIDAZOLAM HCL 2 MG/2ML IJ SOLN
INTRAMUSCULAR | Status: DC | PRN
  Administered 2015-11-23: 1 mg via INTRAVENOUS

## 2015-11-23 MED ORDER — FENTANYL CITRATE (PF) 100 MCG/2ML IJ SOLN
INTRAMUSCULAR | Status: DC | PRN
  Administered 2015-11-23: 50 ug via INTRAVENOUS

## 2015-11-23 MED ORDER — SODIUM CHLORIDE 0.9 % IV SOLN: INTRAVENOUS | Status: DC

## 2015-11-23 MED ORDER — SODIUM CHLORIDE 0.9 % IV SOLN
INTRAVENOUS | Status: DC
  Administered 2015-11-23: 14:00:00 via INTRAVENOUS

## 2015-11-23 NOTE — H&P (Signed)
Primary Care Physician:  Marinda Elk, MD Primary Gastroenterologist:  Dr. Vira Agar  Pre-Procedure History & Physical: HPI:  Brenda Combs is a 70 y.o. female is here for an endoscopy and colonoscopy.   Past Medical History:  Diagnosis Date  . Arthritis   . GERD (gastroesophageal reflux disease)   . Gout   . Heart murmur   . Heart murmur   . History of abnormal Pap smear    x 50yrs ago  . Hyperlipidemia   . Hypertension     Past Surgical History:  Procedure Laterality Date  . APPENDECTOMY    . TUBAL LIGATION    . WISDOM TOOTH EXTRACTION     x 2    Prior to Admission medications   Medication Sig Start Date End Date Taking? Authorizing Provider  allopurinol (ZYLOPRIM) 100 MG tablet Take 100 mg by mouth. 04/15/15  Yes Historical Provider, MD  amLODipine (NORVASC) 5 MG tablet Take 5 mg by mouth daily.   Yes Historical Provider, MD  aspirin 81 MG tablet Take 81 mg by mouth daily.   Yes Historical Provider, MD  atorvastatin (LIPITOR) 40 MG tablet Take 40 mg by mouth. 04/15/15  Yes Historical Provider, MD  naproxen (EC NAPROSYN) 500 MG EC tablet Take 500 mg by mouth 2 (two) times daily with a meal.   Yes Historical Provider, MD  olmesartan-hydrochlorothiazide (BENICAR HCT) 40-25 MG per tablet Take 1 tablet by mouth daily.   Yes Historical Provider, MD  omeprazole (PRILOSEC) 40 MG capsule Take 40 mg by mouth 2 (two) times daily. 04/15/15  Yes Historical Provider, MD  polyethylene glycol powder (MIRALAX) powder Take 1 Container by mouth as needed.   Yes Historical Provider, MD  spironolactone (ALDACTONE) 25 MG tablet  10/09/15  Yes Historical Provider, MD  fluticasone (FLONASE) 50 MCG/ACT nasal spray Place into the nose. 04/15/15   Historical Provider, MD  varenicline (CHANTIX) 1 MG tablet Take 1 mg by mouth 2 (two) times daily.    Historical Provider, MD    Allergies as of 11/06/2015 - Review Complete 10/23/2015  Allergen Reaction Noted  . Zyrtec [cetirizine hcl]  06/29/2011     Family History  Problem Relation Age of Onset  . Arthritis Mother   . Hyperlipidemia Mother   . Hypertension Mother   . Hyperlipidemia Sister   . Hypertension Sister   . Hypertension Daughter   . Diabetes Sister   . Hyperlipidemia Sister   . Hypertension Sister   . Hypertension Daughter   . Hypertension Daughter   . Breast cancer Neg Hx     Social History   Social History  . Marital status: Widowed    Spouse name: N/A  . Number of children: N/A  . Years of education: N/A   Occupational History  . Not on file.   Social History Main Topics  . Smoking status: Current Every Day Smoker  . Smokeless tobacco: Never Used  . Alcohol use No  . Drug use: No  . Sexual activity: Not Currently    Birth control/ protection: Post-menopausal     Comment: widow since 2000   Other Topics Concern  . Not on file   Social History Narrative  . No narrative on file    Review of Systems: See HPI, otherwise negative ROS  Physical Exam: BP (!) 182/93   Pulse (!) 107   Temp 97.8 F (36.6 C) (Tympanic)   Resp 16   Ht 5\' 2"  (1.575 m)   Wt 91.6 kg (  202 lb)   SpO2 100%   BMI 36.95 kg/m  General:   Alert,  pleasant and cooperative in NAD Head:  Normocephalic and atraumatic. Neck:  Supple; no masses or thyromegaly. Lungs:  Clear throughout to auscultation.    Heart:  Regular rate and rhythm. Abdomen:  Soft, nontender and nondistended. Normal bowel sounds, without guarding, and without rebound.   Neurologic:  Alert and  oriented x4;  grossly normal neurologically.  Impression/Plan: Brenda Combs is here for an endoscopy and colonoscopy to be performed for [facility] colon polyps, GERD  Risks, benefits, limitations, and alternatives regarding  endoscopy and colonoscopy have been reviewed with the patient.  Questions have been answered.  All parties agreeable.   Gaylyn Cheers, MD  11/23/2015, 1:36 PM

## 2015-11-23 NOTE — Op Note (Signed)
[facility] Gastroenterology Patient Name: Brenda Combs Procedure Date: 11/23/2015 1:26 PM MRN: [id number] Account #: [id number] Date of Birth: August 17, 1945 Admit Type: Outpatient Age: 70 Room: [facility] ENDO ROOM 1 Gender: Female Note Status: Finalized Procedure:            Colonoscopy Indications:          High risk colon cancer surveillance: Personal history                        of colonic polyps Providers:            Manya Silvas, MD Referring MD:         Precious Bard, MD (Referring MD) Medicines:            Propofol per Anesthesia Complications:        No immediate complications. Procedure:            Pre-Anesthesia Assessment:                       - After reviewing the risks and benefits, the patient                        was deemed in satisfactory condition to undergo the                        procedure.                       After obtaining informed consent, the colonoscope was                        passed under direct vision. Throughout the procedure,                        the patient's blood pressure, pulse, and oxygen                        saturations were monitored continuously. The                        Colonoscope was introduced through the anus and                        advanced to the the cecum, identified by appendiceal                        orifice and ileocecal valve. The colonoscopy was                        performed without difficulty. The patient tolerated the                        procedure well. The quality of the bowel preparation                        was good. Findings:      Many sessile polyps were found in the sigmoid colon, descending colon,       transverse colon and hepatic flexure. There were at least 15 polyps. The       polyps were small-medium in size. These polyps were removed with a hot  snare. Resection and retrieval were complete. To close a defect after       polypectomy, six hemostatic clips  were successfully placed. 3 in one       site, 2 in another and one in another.There was no bleeding during, or       at the end, of the procedure.      Multiple small-mouthed diverticula were found in the sigmoid colon,       descending colon and transverse colon.      Internal hemorrhoids were found during endoscopy. The hemorrhoids were       small and Grade I (internal hemorrhoids that do not prolapse). Impression:           - Many small polyps in the sigmoid colon, in the                        descending colon, in the transverse colon and at the                        hepatic flexure, removed with a hot snare. Resected and                        retrieved. Clips were placed.                       - Diverticulosis in the sigmoid colon, in the                        descending colon and in the transverse colon.                       - Internal hemorrhoids. Recommendation:       - Await pathology results. Repeat in one year due to so                        many polyps. Manya Silvas, MD 11/23/2015 3:01:35 PM This report has been signed electronically. Number of Addenda: 0 Note Initiated On: 11/23/2015 1:26 PM Scope Withdrawal Time: 0 hours 39 minutes 32 seconds  Total Procedure Duration: 1 hour 1 minute 28 seconds       [facility]

## 2015-11-23 NOTE — Transfer of Care (Signed)
Immediate Anesthesia Transfer of Care Note  [name]  Procedure(s) Performed: Procedure(s): COLONOSCOPY WITH PROPOFOL (N/A) ESOPHAGOGASTRODUODENOSCOPY (EGD) WITH PROPOFOL (N/A)  Patient Location: PACU and Endoscopy Unit  Anesthesia Type:General  Level of Consciousness: awake, alert  and oriented  Airway & Oxygen Therapy: Patient Spontanous Breathing and Patient connected to nasal cannula oxygen  Post-op Assessment: Report given to RN and Post -op Vital signs reviewed and stable  Post vital signs: Reviewed and stable  Last Vitals:  Vitals:   11/23/15 1316 11/23/15 1504  BP: (!) 182/93 94/74  Pulse: (!) 107 74  Resp: 16 20  Temp: 36.6 C 36.1 C    Last Pain:  Vitals:   11/23/15 1504  TempSrc: Tympanic         Complications: No apparent anesthesia complications

## 2015-11-23 NOTE — Op Note (Signed)
[facility] Gastroenterology Patient Name: Brenda Combs Procedure Date: 11/23/2015 1:29 PM MRN: [id number] Account #: [id number] Date of Birth: 09-Sep-1945 Admit Type: Outpatient Age: 70 Room: [facility] ENDO ROOM 1 Gender: Female Note Status: Finalized Procedure:            Upper GI endoscopy Indications:          Heartburn, Suspected gastro-esophageal reflux disease Providers:            Manya Silvas, MD Referring MD:         Precious Bard, MD (Referring MD) Medicines:            Propofol per Anesthesia Complications:        No immediate complications. Procedure:            Pre-Anesthesia Assessment:                       - After reviewing the risks and benefits, the patient                        was deemed in satisfactory condition to undergo the                        procedure.                       After obtaining informed consent, the endoscope was                        passed under direct vision. Throughout the procedure,                        the patient's blood pressure, pulse, and oxygen                        saturations were monitored continuously. The                        Colonoscope was introduced through the mouth, and                        advanced to the second part of duodenum. The upper GI                        endoscopy was accomplished without difficulty. The                        patient tolerated the procedure well. Findings:      The examined esophagus was normal. GEJ 36cm.      A small hiatal hernia was present.      Diffuse nodular mucosa was found in the gastric body. Biopsies were       taken with a cold forceps for histology. Biopsies were taken with a cold       forceps for Helicobacter pylori testing.      The examined duodenum was normal. Impression:           - Normal esophagus.                       - Small hiatal hernia.                       -  Nodular mucosa in the gastric body. Biopsied.     - Normal examined duodenum. Recommendation:       - Await pathology results. Take medicine for reflux. Manya Silvas, MD 11/23/2015 1:53:02 PM This report has been signed electronically. Number of Addenda: 0 Note Initiated On: 11/23/2015 1:29 PM      [facility]

## 2015-11-23 NOTE — Anesthesia Preprocedure Evaluation (Signed)
Anesthesia Evaluation  Patient identified by MRN, date of birth, ID band Patient awake    Reviewed: Allergy & Precautions, NPO status , Patient's Chart, lab work & pertinent test results  History of Anesthesia Complications Negative for: history of anesthetic complications  Airway Mallampati: II  TM Distance: >3 FB Neck ROM: Full    Dental  (+) Poor Dentition   Pulmonary neg sleep apnea, neg COPD, Current Smoker,    breath sounds clear to auscultation- rhonchi (-) wheezing      Cardiovascular Exercise Tolerance: Good hypertension, Pt. on medications (-) CAD and (-) Past MI  Rhythm:Regular Rate:Normal - Systolic murmurs and - Diastolic murmurs    Neuro/Psych negative neurological ROS  negative psych ROS   GI/Hepatic Neg liver ROS, GERD  ,  Endo/Other  negative endocrine ROSneg diabetes  Renal/GU negative Renal ROS     Musculoskeletal  (+) Arthritis ,   Abdominal (+) + obese,   Peds  Hematology negative hematology ROS (+)   Anesthesia Other Findings Past Medical History: No date: Arthritis No date: GERD (gastroesophageal reflux disease) No date: Gout No date: Heart murmur No date: Heart murmur No date: History of abnormal Pap smear     Comment: x 65yrs ago No date: Hyperlipidemia No date: Hypertension   Reproductive/Obstetrics                             Anesthesia Physical Anesthesia Plan  ASA: II  Anesthesia Plan: General   Post-op Pain Management:    Induction: Intravenous  Airway Management Planned: Natural Airway  Additional Equipment:   Intra-op Plan:   Post-operative Plan:   Informed Consent: I have reviewed the patients History and Physical, chart, labs and discussed the procedure including the risks, benefits and alternatives for the proposed anesthesia with the patient or authorized representative who has indicated his/her understanding and acceptance.    Dental advisory given  Plan Discussed with: CRNA and Anesthesiologist  Anesthesia Plan Comments:         Anesthesia Quick Evaluation

## 2015-11-23 NOTE — Anesthesia Postprocedure Evaluation (Signed)
Anesthesia Post Note  [name]  Procedure(s) Performed: Procedure(s) (LRB): COLONOSCOPY WITH PROPOFOL (N/A) ESOPHAGOGASTRODUODENOSCOPY (EGD) WITH PROPOFOL (N/A)  Patient location during evaluation: PACU Anesthesia Type: General Level of consciousness: awake and alert and oriented Pain management: pain level controlled Vital Signs Assessment: post-procedure vital signs reviewed and stable Respiratory status: spontaneous breathing, nonlabored ventilation and respiratory function stable Cardiovascular status: blood pressure returned to baseline and stable Postop Assessment: no signs of nausea or vomiting Anesthetic complications: no    Last Vitals:  Vitals:   11/23/15 1316 11/23/15 1504  BP: (!) 182/93 94/74  Pulse: (!) 107 74  Resp: 16 20  Temp: 36.6 C 36.1 C    Last Pain:  Vitals:   11/23/15 1504  TempSrc: Tympanic                 Porshia Blizzard

## 2015-11-24 ENCOUNTER — Encounter: Payer: Self-pay | Admitting: Unknown Physician Specialty

## 2015-11-25 LAB — SURGICAL PATHOLOGY

## 2016-10-05 ENCOUNTER — Other Ambulatory Visit: Payer: Self-pay | Admitting: Physician Assistant

## 2016-10-05 DIAGNOSIS — Z1231 Encounter for screening mammogram for malignant neoplasm of breast: Secondary | ICD-10-CM

## 2016-10-20 ENCOUNTER — Ambulatory Visit
Admission: RE | Admit: 2016-10-20 | Discharge: 2016-10-20 | Disposition: A | Discharge status: Home / Self Care | Payer: Medicare Other | Source: Ambulatory Visit | Attending: Physician Assistant | Admitting: Physician Assistant

## 2016-10-20 DIAGNOSIS — Z1231 Encounter for screening mammogram for malignant neoplasm of breast: Secondary | ICD-10-CM | POA: Insufficient documentation

## 2017-02-07 ENCOUNTER — Other Ambulatory Visit: Payer: Self-pay

## 2017-02-07 ENCOUNTER — Ambulatory Visit: Payer: Medicare Other | Admitting: Anesthesiology

## 2017-02-07 ENCOUNTER — Encounter
Admission: RE | Disposition: A | Discharge status: Home / Self Care | Payer: Self-pay | Source: Ambulatory Visit | Attending: Gastroenterology

## 2017-02-07 ENCOUNTER — Ambulatory Visit
Admission: RE | Admit: 2017-02-07 | Discharge: 2017-02-07 | Disposition: A | Discharge status: Home / Self Care | Payer: Medicare Other | Source: Ambulatory Visit | Attending: Gastroenterology | Admitting: Gastroenterology

## 2017-02-07 ENCOUNTER — Encounter: Payer: Self-pay | Admitting: Anesthesiology

## 2017-02-07 DIAGNOSIS — E785 Hyperlipidemia, unspecified: Secondary | ICD-10-CM | POA: Insufficient documentation

## 2017-02-07 DIAGNOSIS — M109 Gout, unspecified: Secondary | ICD-10-CM | POA: Insufficient documentation

## 2017-02-07 DIAGNOSIS — Z79899 Other long term (current) drug therapy: Secondary | ICD-10-CM | POA: Insufficient documentation

## 2017-02-07 DIAGNOSIS — Z1211 Encounter for screening for malignant neoplasm of colon: Secondary | ICD-10-CM | POA: Insufficient documentation

## 2017-02-07 DIAGNOSIS — Z880 Allergy status to penicillin: Secondary | ICD-10-CM | POA: Insufficient documentation

## 2017-02-07 DIAGNOSIS — D127 Benign neoplasm of rectosigmoid junction: Secondary | ICD-10-CM | POA: Insufficient documentation

## 2017-02-07 DIAGNOSIS — K219 Gastro-esophageal reflux disease without esophagitis: Secondary | ICD-10-CM | POA: Insufficient documentation

## 2017-02-07 DIAGNOSIS — D125 Benign neoplasm of sigmoid colon: Secondary | ICD-10-CM | POA: Diagnosis not present

## 2017-02-07 DIAGNOSIS — D123 Benign neoplasm of transverse colon: Secondary | ICD-10-CM | POA: Diagnosis not present

## 2017-02-07 DIAGNOSIS — K640 First degree hemorrhoids: Secondary | ICD-10-CM | POA: Insufficient documentation

## 2017-02-07 DIAGNOSIS — Z86010 Personal history of colonic polyps: Secondary | ICD-10-CM | POA: Diagnosis not present

## 2017-02-07 DIAGNOSIS — Z7982 Long term (current) use of aspirin: Secondary | ICD-10-CM | POA: Insufficient documentation

## 2017-02-07 DIAGNOSIS — K5730 Diverticulosis of large intestine without perforation or abscess without bleeding: Secondary | ICD-10-CM | POA: Diagnosis not present

## 2017-02-07 DIAGNOSIS — D124 Benign neoplasm of descending colon: Secondary | ICD-10-CM | POA: Diagnosis not present

## 2017-02-07 DIAGNOSIS — I10 Essential (primary) hypertension: Secondary | ICD-10-CM | POA: Insufficient documentation

## 2017-02-07 HISTORY — PX: COLONOSCOPY WITH PROPOFOL: SHX5780

## 2017-02-07 SURGERY — COLONOSCOPY WITH PROPOFOL
Anesthesia: General

## 2017-02-07 MED ORDER — MIDAZOLAM HCL 2 MG/2ML IJ SOLN
INTRAMUSCULAR | Status: DC | PRN
  Administered 2017-02-07 (×2): 1 mg via INTRAVENOUS

## 2017-02-07 MED ORDER — MIDAZOLAM HCL 5 MG/5ML IJ SOLN
INTRAMUSCULAR | Status: AC
  Filled 2017-02-07: qty 5

## 2017-02-07 MED ORDER — SODIUM CHLORIDE 0.9 % IV SOLN: INTRAVENOUS | Status: DC

## 2017-02-07 MED ORDER — EPHEDRINE SULFATE 50 MG/ML IJ SOLN
INTRAMUSCULAR | Status: DC | PRN
  Administered 2017-02-07: 5 mg via INTRAVENOUS
  Administered 2017-02-07: 10 mg via INTRAVENOUS

## 2017-02-07 MED ORDER — PROPOFOL 500 MG/50ML IV EMUL
INTRAVENOUS | Status: AC
  Filled 2017-02-07: qty 50

## 2017-02-07 MED ORDER — SODIUM CHLORIDE 0.9 % IV SOLN
INTRAVENOUS | Status: DC
  Administered 2017-02-07: 13:00:00 via INTRAVENOUS

## 2017-02-07 MED ORDER — PROPOFOL 500 MG/50ML IV EMUL
INTRAVENOUS | Status: DC | PRN
  Administered 2017-02-07: 120 ug/kg/min via INTRAVENOUS

## 2017-02-07 MED ORDER — FENTANYL CITRATE (PF) 100 MCG/2ML IJ SOLN
INTRAMUSCULAR | Status: DC | PRN
  Administered 2017-02-07 (×2): 25 ug via INTRAVENOUS
  Administered 2017-02-07: 50 ug via INTRAVENOUS

## 2017-02-07 MED ORDER — FENTANYL CITRATE (PF) 250 MCG/5ML IJ SOLN
INTRAMUSCULAR | Status: AC
  Filled 2017-02-07: qty 5

## 2017-02-07 NOTE — Anesthesia Procedure Notes (Signed)
Performed by: Cook-Martin, Star Resler Pre-anesthesia Checklist: Patient identified, Emergency Drugs available, Suction available, Patient being monitored and Timeout performed Patient Re-evaluated:Patient Re-evaluated prior to induction Oxygen Delivery Method: Nasal cannula Preoxygenation: Pre-oxygenation with 100% oxygen Induction Type: IV induction Placement Confirmation: positive ETCO2 and CO2 detector       

## 2017-02-07 NOTE — H&P (Signed)
Outpatient short stay form Pre-procedure 02/07/2017 1:26 PM Brenda Sails MD  Primary Physician: Jackelyn Poling PA  Reason for visit:  Colonoscopy  History of present illness:  Patient is a 72 year old female presenting today as above. She has personal history of adenomatous colon polyps. Her last colonoscopy was on 11/23/2015 by Dr. Vira Agar. At that time there were multiple polyps removed, at least 15, these were small to medium in size. Multiple hemostatic clips were placed at that time. About two thirds of these were adenomatous. She is returning today for further evaluation. She tolerated her prep well. She does take a daily 81 mg aspirin the last time today. She takes no other aspirin products or blood thinning agents.    Current Facility-Administered Medications:  .  0.9 %  sodium chloride infusion, , Intravenous, Continuous, Brenda Sails, MD, Last Rate: 20 mL/hr at 02/07/17 1317 .  0.9 %  sodium chloride infusion, , Intravenous, Continuous, Brenda Sails, MD  Medications Prior to Admission  Medication Sig Dispense Refill Last Dose  . allopurinol (ZYLOPRIM) 100 MG tablet Take 100 mg by mouth.   02/07/2017 at 0700  . amLODipine (NORVASC) 5 MG tablet Take 5 mg by mouth daily.   02/07/2017 at 0700  . aspirin 81 MG tablet Take 81 mg by mouth daily.   02/07/2017 at 0700  . atorvastatin (LIPITOR) 40 MG tablet Take 40 mg by mouth.   02/07/2017 at 0700  . fluticasone (FLONASE) 50 MCG/ACT nasal spray Place into the nose.   02/06/2017 at Unknown time  . naproxen (EC NAPROSYN) 500 MG EC tablet Take 500 mg by mouth 2 (two) times daily with a meal.   Past Month at Unknown time  . olmesartan-hydrochlorothiazide (BENICAR HCT) 40-25 MG per tablet Take 1 tablet by mouth daily.   02/07/2017 at 0700  . omeprazole (PRILOSEC) 40 MG capsule Take 40 mg by mouth 2 (two) times daily.   02/07/2017 at 0700  . spironolactone (ALDACTONE) 25 MG tablet    02/07/2017 at 0700  . polyethylene glycol powder (MIRALAX)  powder Take 1 Container by mouth as needed.   Not Taking at Unknown time     Allergies  Allergen Reactions  . Augmentin [Amoxicillin-Pot Clavulanate] Diarrhea  . Zyrtec [Cetirizine Hcl]     depression     Past Medical History:  Diagnosis Date  . Arthritis   . GERD (gastroesophageal reflux disease)   . Gout   . Heart murmur   . Heart murmur   . History of abnormal Pap smear    x 58yrs ago  . Hyperlipidemia   . Hypertension     Review of systems:      Physical Exam    Heart and lungs: Regular rate and rhythm without rub or gallop, lungs are bilaterally clear.    HEENT: Normocephalic atraumatic eyes are anicteric    Other:     Pertinant exam for procedure: Soft nontender nondistended bowel sounds positive normoactive.    Planned proceedures: Colonoscopy and indicated procedures. I have discussed the risks benefits and complications of procedures to include not limited to bleeding, infection, perforation and the risk of sedation and the patient wishes to proceed.    Brenda Sails, MD Gastroenterology 02/07/2017  1:26 PM

## 2017-02-07 NOTE — Anesthesia Post-op Follow-up Note (Signed)
Anesthesia QCDR form completed.        

## 2017-02-07 NOTE — Transfer of Care (Signed)
Immediate Anesthesia Transfer of Care Note  [name]  Procedure(s) Performed: COLONOSCOPY WITH PROPOFOL (N/A )  Patient Location: PACU  Anesthesia Type:General  Level of Consciousness: awake and sedated  Airway & Oxygen Therapy: Patient Spontanous Breathing and Patient connected to nasal cannula oxygen  Post-op Assessment: Report given to RN and Post -op Vital signs reviewed and stable  Post vital signs: Reviewed and stable  Last Vitals:  Vitals:   02/07/17 1258  BP: (!) 189/91  Pulse: 83  Resp: 16  Temp: (!) 35.8 C  SpO2: 99%    Last Pain:  Vitals:   02/07/17 1258  TempSrc: Tympanic         Complications: No apparent anesthesia complications

## 2017-02-07 NOTE — Anesthesia Preprocedure Evaluation (Signed)
Anesthesia Evaluation  Patient identified by MRN, date of birth, ID band Patient awake    Reviewed: Allergy & Precautions, NPO status , Patient's Chart, lab work & pertinent test results  History of Anesthesia Complications Negative for: history of anesthetic complications  Airway Mallampati: II       Dental   Pulmonary neg sleep apnea, neg COPD, Current Smoker,           Cardiovascular hypertension, Pt. on medications (-) Past MI and (-) CHF (-) dysrhythmias + Valvular Problems/Murmurs (murmur, no tx)      Neuro/Psych neg Seizures    GI/Hepatic Neg liver ROS, GERD  Medicated,  Endo/Other  neg diabetes  Renal/GU negative Renal ROS     Musculoskeletal   Abdominal   Peds  Hematology   Anesthesia Other Findings   Reproductive/Obstetrics                             Anesthesia Physical Anesthesia Plan  ASA: II  Anesthesia Plan: General   Post-op Pain Management:    Induction: Intravenous  PONV Risk Score and Plan: 2 and TIVA and Propofol infusion  Airway Management Planned: Nasal Cannula  Additional Equipment:   Intra-op Plan:   Post-operative Plan:   Informed Consent: I have reviewed the patients History and Physical, chart, labs and discussed the procedure including the risks, benefits and alternatives for the proposed anesthesia with the patient or authorized representative who has indicated his/her understanding and acceptance.     Plan Discussed with:   Anesthesia Plan Comments:         Anesthesia Quick Evaluation

## 2017-02-07 NOTE — Op Note (Signed)
[facility] Patient Name: Brenda Combs Procedure Date: 02/07/2017 1:26 PM MRN: [id number] Account #: [id number] Date of Birth: 10-Dec-1945 Admit Type: Outpatient Age: 72 Room: [facility] ENDO ROOM 3 Gender: Female Note Status: Finalized Procedure:            Colonoscopy Indications:          Personal history of colonic polyps Providers:            [name], MD Referring MD:         Precious Bard, MD (Referring MD) Medicines:            Monitored Anesthesia Care Complications:        No immediate complications. Procedure:            Pre-Anesthesia Assessment:                       - ASA Grade Assessment: III - A patient with severe                        systemic disease.                       After obtaining informed consent, the colonoscope was                        passed under direct vision. Throughout the procedure,                        the patient's blood pressure, pulse, and oxygen                        saturations were monitored continuously. The                        Colonoscope was introduced through the anus and                        advanced to the the cecum, identified by appendiceal                        orifice and ileocecal valve. The colonoscopy was                        performed with moderate difficulty due to multiple                        diverticula in the colon, poor bowel prep and a                        tortuous colon. Successful completion of the procedure                        was aided by changing the patient to a supine position,                        changing the patient to a prone position, using manual                        pressure and lavage. The patient tolerated the  procedure well. The quality of the bowel preparation                        was fair. Findings:      Multiple small and large-mouthed diverticula were found in the sigmoid       colon, descending  colon, transverse colon and ascending colon.      A 4 mm polyp was found in the proximal transverse colon. The polyp was       sessile. The polyp was removed with a cold snare. Resection and       retrieval were complete.      Two sessile polyps were found in the hepatic flexure. The polyps were 2       mm in size. These polyps were removed with a cold biopsy forceps.       Resection and retrieval were complete.      A 2 mm polyp was found in the descending colon. The polyp was sessile.       The polyp was removed with a cold biopsy forceps. Resection and       retrieval were complete.      A 3 mm polyp was found in the distal descending colon. The polyp was       sessile. The polyp was removed with a cold biopsy forceps. Resection and       retrieval were complete.      A 3 mm polyp was found in the sigmoid colon. The polyp was sessile. The       polyp was removed with a cold biopsy forceps. Resection and retrieval       were complete.      Eight sessile polyps were found in the recto-sigmoid colon. The polyps       were 2 to 4 mm in size. These polyps were removed with a cold snare.       Resection and retrieval were complete.      Non-bleeding internal hemorrhoids were found during anoscopy. The       hemorrhoids were small and Grade I (internal hemorrhoids that do not       prolapse).      The digital rectal exam was normal otherwise. Impression:           - Preparation of the colon was fair.                       - Diverticulosis in the sigmoid colon, in the                        descending colon, in the transverse colon and in the                        ascending colon.                       - One 4 mm polyp in the proximal transverse colon,                        removed with a cold snare. Resected and retrieved.                       - Two 2 mm polyps at the hepatic flexure, removed with  a cold biopsy forceps. Resected and retrieved.                        - One 2 mm polyp in the descending colon, removed with                        a cold biopsy forceps. Resected and retrieved.                       - One 3 mm polyp in the distal descending colon,                        removed with a cold biopsy forceps. Resected and                        retrieved.                       - One 3 mm polyp in the sigmoid colon, removed with a                        cold biopsy forceps. Resected and retrieved.                       - Eight 2 to 4 mm polyps at the recto-sigmoid colon,                        removed with a cold snare. Resected and retrieved.                       - Non-bleeding internal hemorrhoids. Recommendation:       - Discharge patient to home.                       - Full liquid diet for 1 day, then advance as tolerated                        to soft diet for 2 days. Procedure Code(s):    --- Professional ---                       [phone], Colonoscopy, flexible; with removal of tumor(s),                        polyp(s), or other lesion(s) by snare technique                       45380, 23, Colonoscopy, flexible; with biopsy, single                        or multiple Diagnosis Code(s):    --- Professional ---                       K64.0, First degree hemorrhoids                       D12.3, Benign neoplasm of transverse colon (hepatic                        flexure or splenic flexure)  D12.4, Benign neoplasm of descending colon                       D12.5, Benign neoplasm of sigmoid colon                       D12.7, Benign neoplasm of rectosigmoid junction                       Z86.010, Personal history of colonic polyps                       K57.30, Diverticulosis of large intestine without                        perforation or abscess without bleeding CPT copyright 2016 American Medical Association. All rights reserved. The codes documented in this report are preliminary and upon coder review may  be revised to  meet current compliance requirements. [name], MD 02/07/2017 2:48:57 PM This report has been signed electronically. Number of Addenda: 0 Note Initiated On: 02/07/2017 1:26 PM Scope Withdrawal Time: 0 hours 33 minutes 56 seconds  Total Procedure Duration: 0 hours 54 minutes 19 seconds       [facility]

## 2017-02-08 ENCOUNTER — Encounter: Payer: Self-pay | Admitting: Gastroenterology

## 2017-02-10 LAB — SURGICAL PATHOLOGY

## 2017-02-10 NOTE — Anesthesia Postprocedure Evaluation (Signed)
Anesthesia Post Note  [name]  Procedure(s) Performed: COLONOSCOPY WITH PROPOFOL (N/A )  Patient location during evaluation: Endoscopy Anesthesia Type: General Level of consciousness: awake and alert Pain management: pain level controlled Vital Signs Assessment: post-procedure vital signs reviewed and stable Respiratory status: spontaneous breathing, nonlabored ventilation, respiratory function stable and patient connected to nasal cannula oxygen Cardiovascular status: blood pressure returned to baseline and stable Postop Assessment: no apparent nausea or vomiting Anesthetic complications: no     Last Vitals:  Vitals:   02/07/17 1457 02/07/17 1507  BP: (!) 125/102 (!) 120/54  Pulse: 73 74  Resp: 13 19  Temp:    SpO2: 98% 100%    Last Pain:  Vitals:   02/07/17 1507  TempSrc:   PainSc: 0-No pain                 Martha Clan

## 2017-10-03 ENCOUNTER — Other Ambulatory Visit: Payer: Self-pay | Admitting: Physician Assistant

## 2017-10-03 DIAGNOSIS — Z1231 Encounter for screening mammogram for malignant neoplasm of breast: Secondary | ICD-10-CM

## 2017-10-13 ENCOUNTER — Telehealth: Payer: Self-pay | Admitting: *Deleted

## 2017-10-13 ENCOUNTER — Encounter: Payer: Self-pay | Admitting: *Deleted

## 2017-10-13 DIAGNOSIS — Z122 Encounter for screening for malignant neoplasm of respiratory organs: Secondary | ICD-10-CM

## 2017-10-13 NOTE — Telephone Encounter (Signed)
Received a referral for initial lung cancer screening scan.  Contacted the patient and obtained their smoking history, current 0.4ppd with 30pkyr history  as well as answering questions related to screening process.  Patient denies signs of lung cancer such as weight loss or hemoptysis at this time.  Patient denies comorbidity that would prevent curative treatment if lung cancer were found.  Patient is scheduled for the Shared Decision Making Visit and CT scan on 11-02-17@1345  .

## 2017-10-26 ENCOUNTER — Ambulatory Visit
Admission: RE | Admit: 2017-10-26 | Discharge: 2017-10-26 | Disposition: A | Discharge status: Home / Self Care | Payer: Medicare Other | Source: Ambulatory Visit | Attending: Physician Assistant | Admitting: Physician Assistant

## 2017-10-26 DIAGNOSIS — Z1231 Encounter for screening mammogram for malignant neoplasm of breast: Secondary | ICD-10-CM | POA: Insufficient documentation

## 2017-11-01 ENCOUNTER — Telehealth: Payer: Self-pay | Admitting: *Deleted

## 2017-11-01 NOTE — Telephone Encounter (Signed)
Called patient to remind her of her ldct screening appt for 11-02-17@1345  voiced understanding.

## 2017-11-02 ENCOUNTER — Ambulatory Visit
Admission: RE | Admit: 2017-11-02 | Discharge: 2017-11-02 | Disposition: A | Discharge status: Home / Self Care | Payer: Medicare Other | Source: Ambulatory Visit | Attending: Nurse Practitioner | Admitting: Nurse Practitioner

## 2017-11-02 ENCOUNTER — Inpatient Hospital Stay: Payer: Medicare Other | Attending: Nurse Practitioner | Admitting: Nurse Practitioner

## 2017-11-02 DIAGNOSIS — I700 Atherosclerosis of aorta: Secondary | ICD-10-CM | POA: Insufficient documentation

## 2017-11-02 DIAGNOSIS — K5730 Diverticulosis of large intestine without perforation or abscess without bleeding: Secondary | ICD-10-CM | POA: Insufficient documentation

## 2017-11-02 DIAGNOSIS — F17210 Nicotine dependence, cigarettes, uncomplicated: Secondary | ICD-10-CM | POA: Diagnosis not present

## 2017-11-02 DIAGNOSIS — Z122 Encounter for screening for malignant neoplasm of respiratory organs: Secondary | ICD-10-CM

## 2017-11-02 DIAGNOSIS — I281 Aneurysm of pulmonary artery: Secondary | ICD-10-CM | POA: Insufficient documentation

## 2017-11-02 DIAGNOSIS — J432 Centrilobular emphysema: Secondary | ICD-10-CM | POA: Insufficient documentation

## 2017-11-02 DIAGNOSIS — I2510 Atherosclerotic heart disease of native coronary artery without angina pectoris: Secondary | ICD-10-CM | POA: Diagnosis not present

## 2017-11-02 DIAGNOSIS — Z87891 Personal history of nicotine dependence: Secondary | ICD-10-CM | POA: Diagnosis not present

## 2017-11-02 NOTE — Progress Notes (Signed)
In accordance with CMS guidelines, patient has met eligibility criteria including age, absence of signs or symptoms of lung cancer.  Social History   Tobacco Use  . Smoking status: Current Every Day Smoker    Packs/day: 0.75    Years: 40.00    Pack years: 30.00    Types: Cigarettes  . Smokeless tobacco: Never Used  Substance Use Topics  . Alcohol use: No  . Drug use: No      A shared decision-making session was conducted prior to the performance of CT scan. This includes one or more decision aids, includes benefits and harms of screening, follow-up diagnostic testing, over-diagnosis, false positive rate, and total radiation exposure.   Counseling on the importance of adherence to annual lung cancer LDCT screening, impact of co-morbidities, and ability or willingness to undergo diagnosis and treatment is imperative for compliance of the program.   Counseling on the importance of continued smoking cessation for former smokers; the importance of smoking cessation for current smokers, and information about tobacco cessation interventions have been given to patient including [place] and [address] quit [place] programs.   Written order for lung cancer screening with LDCT has been given to the patient and any and all questions have been answered to the best of my abilities.    Yearly follow up will be coordinated by Burgess Estelle, Thoracic Navigator.  Beckey Rutter, DNP, AGNP-C [place] at [facility] [phone] (work cell) [phone] (office) 11/02/17 2:46 PM

## 2017-11-03 ENCOUNTER — Telehealth: Payer: Self-pay | Admitting: *Deleted

## 2017-11-03 ENCOUNTER — Encounter: Payer: Self-pay | Admitting: *Deleted

## 2017-11-03 NOTE — Telephone Encounter (Signed)
Notified patient of LDCT lung cancer screening program results with recommendation for 12 month follow up imaging.  Also notified of incidental findings noted below and is encouraged to discuss further questions with PCP who will receive a copy of this not and/or the CT reports.  Patient verbalized understanding.   IMPRESSION: 1. Lung-RADS 2, benign appearance or behavior. Continue annual screening with low-dose chest CT without contrast in 12 months. 2. Dilated main pulmonary artery, suggesting pulmonary arterial hypertension. 3. One vessel coronary atherosclerosis. 4. Colonic diverticulosis.  Aortic Atherosclerosis (ICD10-I70.0) and Emphysema (ICD10-J43.9).

## 2018-09-17 ENCOUNTER — Other Ambulatory Visit: Payer: Self-pay | Admitting: Physician Assistant

## 2018-09-17 DIAGNOSIS — Z1231 Encounter for screening mammogram for malignant neoplasm of breast: Secondary | ICD-10-CM

## 2018-10-29 ENCOUNTER — Ambulatory Visit
Admission: RE | Admit: 2018-10-29 | Discharge: 2018-10-29 | Disposition: A | Discharge status: Home / Self Care | Payer: Medicare Other | Source: Ambulatory Visit | Attending: Physician Assistant | Admitting: Physician Assistant

## 2018-10-29 DIAGNOSIS — Z1231 Encounter for screening mammogram for malignant neoplasm of breast: Secondary | ICD-10-CM | POA: Insufficient documentation

## 2018-11-01 ENCOUNTER — Telehealth: Payer: Self-pay | Admitting: *Deleted

## 2018-11-01 DIAGNOSIS — Z122 Encounter for screening for malignant neoplasm of respiratory organs: Secondary | ICD-10-CM

## 2018-11-01 DIAGNOSIS — Z87891 Personal history of nicotine dependence: Secondary | ICD-10-CM

## 2018-11-01 NOTE — Telephone Encounter (Signed)
Patient has been notified that annual lung cancer screening low dose CT scan is due currently or will be in near future. Confirmed that patient is within the age range of 55-77, and asymptomatic, (no signs or symptoms of lung cancer). Patient denies illness that would prevent curative treatment for lung cancer if found. Verified smoking history, (current, 30.33 pack year). The shared decision making visit was done 11/02/17. Patient is agreeable for CT scan being scheduled.

## 2018-11-05 ENCOUNTER — Ambulatory Visit
Admission: RE | Admit: 2018-11-05 | Discharge: 2018-11-05 | Disposition: A | Discharge status: Home / Self Care | Payer: Medicare Other | Source: Ambulatory Visit | Attending: Oncology | Admitting: Oncology

## 2018-11-05 ENCOUNTER — Other Ambulatory Visit: Payer: Self-pay

## 2018-11-05 DIAGNOSIS — Z122 Encounter for screening for malignant neoplasm of respiratory organs: Secondary | ICD-10-CM | POA: Insufficient documentation

## 2018-11-05 DIAGNOSIS — Z87891 Personal history of nicotine dependence: Secondary | ICD-10-CM | POA: Diagnosis present

## 2018-11-07 ENCOUNTER — Encounter: Payer: Self-pay | Admitting: *Deleted

## 2019-09-23 ENCOUNTER — Other Ambulatory Visit: Payer: Self-pay | Admitting: Physician Assistant

## 2019-09-23 DIAGNOSIS — Z1231 Encounter for screening mammogram for malignant neoplasm of breast: Secondary | ICD-10-CM

## 2019-10-28 ENCOUNTER — Telehealth: Payer: Self-pay | Admitting: *Deleted

## 2019-10-28 DIAGNOSIS — Z87891 Personal history of nicotine dependence: Secondary | ICD-10-CM

## 2019-10-28 DIAGNOSIS — Z122 Encounter for screening for malignant neoplasm of respiratory organs: Secondary | ICD-10-CM

## 2019-10-28 NOTE — Telephone Encounter (Signed)
Contacted and scheduled. Current smoker, 30.66 pack year

## 2019-10-30 ENCOUNTER — Ambulatory Visit
Admission: RE | Admit: 2019-10-30 | Discharge: 2019-10-30 | Disposition: A | Discharge status: Home / Self Care | Payer: Medicare PPO | Source: Ambulatory Visit | Attending: Physician Assistant | Admitting: Physician Assistant

## 2019-10-30 ENCOUNTER — Other Ambulatory Visit: Payer: Self-pay

## 2019-10-30 DIAGNOSIS — Z1231 Encounter for screening mammogram for malignant neoplasm of breast: Secondary | ICD-10-CM | POA: Insufficient documentation

## 2019-11-04 ENCOUNTER — Other Ambulatory Visit: Payer: Self-pay | Admitting: Physician Assistant

## 2019-11-04 DIAGNOSIS — N6489 Other specified disorders of breast: Secondary | ICD-10-CM

## 2019-11-04 DIAGNOSIS — R928 Other abnormal and inconclusive findings on diagnostic imaging of breast: Secondary | ICD-10-CM

## 2019-11-05 ENCOUNTER — Ambulatory Visit
Admission: RE | Admit: 2019-11-05 | Discharge: 2019-11-05 | Disposition: A | Discharge status: Home / Self Care | Payer: Medicare PPO | Source: Ambulatory Visit | Attending: Nurse Practitioner | Admitting: Nurse Practitioner

## 2019-11-05 ENCOUNTER — Other Ambulatory Visit: Payer: Self-pay

## 2019-11-05 DIAGNOSIS — Z122 Encounter for screening for malignant neoplasm of respiratory organs: Secondary | ICD-10-CM | POA: Insufficient documentation

## 2019-11-05 DIAGNOSIS — Z87891 Personal history of nicotine dependence: Secondary | ICD-10-CM | POA: Diagnosis present

## 2019-11-07 ENCOUNTER — Ambulatory Visit
Admission: RE | Admit: 2019-11-07 | Discharge: 2019-11-07 | Disposition: A | Discharge status: Home / Self Care | Payer: Medicare PPO | Source: Ambulatory Visit | Attending: Physician Assistant | Admitting: Physician Assistant

## 2019-11-07 ENCOUNTER — Encounter: Payer: Self-pay | Admitting: *Deleted

## 2019-11-07 DIAGNOSIS — N6489 Other specified disorders of breast: Secondary | ICD-10-CM

## 2019-11-07 DIAGNOSIS — R928 Other abnormal and inconclusive findings on diagnostic imaging of breast: Secondary | ICD-10-CM | POA: Diagnosis present

## 2020-02-01 HISTORY — PX: BREAST EXCISIONAL BIOPSY: SUR124

## 2020-11-11 ENCOUNTER — Other Ambulatory Visit: Payer: Self-pay | Admitting: Physician Assistant

## 2020-11-11 DIAGNOSIS — Z1231 Encounter for screening mammogram for malignant neoplasm of breast: Secondary | ICD-10-CM

## 2020-12-08 ENCOUNTER — Ambulatory Visit
Admission: RE | Admit: 2020-12-08 | Discharge: 2020-12-08 | Disposition: A | Discharge status: Home / Self Care | Payer: Medicare PPO | Source: Ambulatory Visit | Attending: Physician Assistant | Admitting: Physician Assistant

## 2020-12-08 ENCOUNTER — Other Ambulatory Visit: Payer: Self-pay

## 2020-12-08 DIAGNOSIS — Z1231 Encounter for screening mammogram for malignant neoplasm of breast: Secondary | ICD-10-CM | POA: Insufficient documentation

## 2020-12-11 ENCOUNTER — Other Ambulatory Visit: Payer: Self-pay | Admitting: Physician Assistant

## 2020-12-11 DIAGNOSIS — N6320 Unspecified lump in the left breast, unspecified quadrant: Secondary | ICD-10-CM

## 2020-12-11 DIAGNOSIS — R928 Other abnormal and inconclusive findings on diagnostic imaging of breast: Secondary | ICD-10-CM

## 2020-12-28 ENCOUNTER — Ambulatory Visit
Admission: RE | Admit: 2020-12-28 | Discharge: 2020-12-28 | Disposition: A | Discharge status: Home / Self Care | Payer: Medicare PPO | Source: Ambulatory Visit | Attending: Physician Assistant | Admitting: Physician Assistant

## 2020-12-28 ENCOUNTER — Other Ambulatory Visit: Payer: Self-pay

## 2020-12-28 DIAGNOSIS — N6320 Unspecified lump in the left breast, unspecified quadrant: Secondary | ICD-10-CM | POA: Diagnosis present

## 2020-12-28 DIAGNOSIS — R928 Other abnormal and inconclusive findings on diagnostic imaging of breast: Secondary | ICD-10-CM | POA: Diagnosis not present

## 2020-12-30 ENCOUNTER — Other Ambulatory Visit: Payer: Self-pay | Admitting: Physician Assistant

## 2020-12-30 DIAGNOSIS — N6320 Unspecified lump in the left breast, unspecified quadrant: Secondary | ICD-10-CM

## 2020-12-30 DIAGNOSIS — R928 Other abnormal and inconclusive findings on diagnostic imaging of breast: Secondary | ICD-10-CM

## 2020-12-30 DIAGNOSIS — R921 Mammographic calcification found on diagnostic imaging of breast: Secondary | ICD-10-CM

## 2021-01-07 ENCOUNTER — Ambulatory Visit
Admission: RE | Admit: 2021-01-07 | Discharge: 2021-01-07 | Disposition: A | Discharge status: Home / Self Care | Payer: Medicare PPO | Source: Ambulatory Visit | Attending: Physician Assistant | Admitting: Physician Assistant

## 2021-01-07 ENCOUNTER — Other Ambulatory Visit: Payer: Self-pay

## 2021-01-07 DIAGNOSIS — N6320 Unspecified lump in the left breast, unspecified quadrant: Secondary | ICD-10-CM | POA: Diagnosis present

## 2021-01-07 DIAGNOSIS — R921 Mammographic calcification found on diagnostic imaging of breast: Secondary | ICD-10-CM | POA: Diagnosis present

## 2021-01-07 DIAGNOSIS — R928 Other abnormal and inconclusive findings on diagnostic imaging of breast: Secondary | ICD-10-CM

## 2021-01-07 HISTORY — PX: BREAST BIOPSY: SHX20

## 2021-01-08 LAB — SURGICAL PATHOLOGY

## 2021-01-11 ENCOUNTER — Encounter: Payer: Self-pay | Admitting: *Deleted

## 2021-01-11 NOTE — Progress Notes (Signed)
Notified by Stacie Acres, RN at [facility] that patient had been notified of her benign breast biopsy but need for a surgical consult.  I have spoken with the patient to establish navigation services.  She is scheduled to see Dr. Peyton Najjar on 01/13/21 @ 9:00.  She was encouraged to call with any questions or needs.

## 2021-01-14 ENCOUNTER — Other Ambulatory Visit: Payer: Self-pay | Admitting: General Surgery

## 2021-01-14 ENCOUNTER — Ambulatory Visit: Payer: Self-pay | Admitting: General Surgery

## 2021-01-14 DIAGNOSIS — D242 Benign neoplasm of left breast: Secondary | ICD-10-CM

## 2021-01-14 NOTE — H&P (Signed)
PATIENT PROFILE: Brenda Combs is a 75 y.o. female who presents to the Clinic for consultation at the request of Dr. Carrie Mew for evaluation of left breast papilloma with atypia.  PCP:  Sheron Nightingale, PA  HISTORY OF PRESENT ILLNESS: Ms. Denherder reports she had her usual screening mammogram.  Screening mammogram shows concerning calcification.  This led to diagnostic mammogram and ultrasound of the left breast.  Mammogram and ultrasound confirmed a 4 cm area of linear calcification with a small mass associated with the calcifications.  Biopsy of 2 spots shows papillary lesion with atypia.  No invasive carcinoma was identified.  Patient denies any nipple discharge, palpable masses or skin changes.  Family history of breast cancer: None Family history of other cancers: None Menarche: 23 to 75 years old Menopause: In her 75s Used OCP: Denies Used estrogen and progesterone therapy: Denies Number of pregnancies: 3 Age of her pregnancies: 89 History of Radiation to the chest: None No previous breast biopsy  PROBLEM LIST: Problem List  Date Reviewed: 10/13/2017          Noted   Osteopenia 04/13/2015   Hyperlipidemia 11/20/2013   Gout 11/20/2013   Gastritis Unknown   Allergic rhinitis Unknown   Nicotine dependence Unknown   Vitamin D deficiency Unknown   Hypertension Unknown    GENERAL REVIEW OF SYSTEMS:   General ROS: negative for - chills, fatigue, fever, weight gain or weight loss Allergy and Immunology ROS: negative for - hives  Hematological and Lymphatic ROS: negative for - bleeding problems or bruising, negative for palpable nodes Endocrine ROS: negative for - heat or cold intolerance, hair changes Respiratory ROS: negative for - cough, shortness of breath or wheezing Cardiovascular ROS: no chest pain or palpitations GI ROS: negative for nausea, vomiting, abdominal pain, diarrhea, constipation Musculoskeletal ROS: negative for - joint swelling or muscle  pain Neurological ROS: negative for - confusion, syncope Dermatological ROS: negative for pruritus and rash Psychiatric: negative for anxiety, depression, difficulty sleeping and memory loss  MEDICATIONS: Current Outpatient Medications  Medication Sig Dispense Refill   allopurinoL (ZYLOPRIM) 100 MG tablet Take 1 tablet (100 mg total) by mouth once daily 90 tablet 1   amLODIPine (NORVASC) 5 MG tablet Take 1.5 tablets (7.5 mg total) by mouth once daily 135 tablet 1   aspirin 81 MG EC tablet Take 81 mg by mouth once daily.     atorvastatin (LIPITOR) 40 MG tablet Take 1 tablet (40 mg total) by mouth once daily 90 tablet 1   cyclobenzaprine (FLEXERIL) 5 MG tablet Take 1 tablet (5 mg total) by mouth 2 (two) times daily as needed 60 tablet 2   fluticasone propionate (FLONASE) 50 mcg/actuation nasal spray Place 1 spray into both nostrils once daily 16 g 2   naproxen (NAPROSYN) 500 MG tablet Take 1 tablet (500 mg total) by mouth 2 (two) times daily 90 tablet 1   olmesartan (BENICAR) 40 MG tablet Take 1 tablet (40 mg total) by mouth once daily 90 tablet 1   omeprazole (PRILOSEC) 40 MG DR capsule Take 1 capsule (40 mg total) by mouth 2 (two) times daily before meals 180 capsule 3   sodium, potassium, and magnesium (SUPREP) oral solution Take 1 Bottle by mouth as directed One kit contains 2 bottles.  Take both bottles at the times instructed by your provider. 354 mL 0   spironolactone (ALDACTONE) 25 MG tablet Take 1 tablet (25 mg total) by mouth once daily 90 tablet 1   ACETAMINOPHEN/CHLORPHENIRAMINE (CORICIDIN  ORAL) Take 1 tablet by mouth once daily.   (Patient not taking: Reported on 11/11/2020)     calcium carbonate-vitamin D3 (CALTRATE 600+D) 600 mg(1,579m) -200 unit tablet Take 1 tablet by mouth once daily (Patient not taking: Reported on 05/11/2020)     predniSONE (DELTASONE) 20 MG tablet Take 441mx 3 days, 2056m 3 days, 54m81m3 days. (Patient not taking: Reported on 11/11/2020) 12 tablet 0   No  current facility-administered medications for this visit.    ALLERGIES: Augmentin [amoxicillin-pot clavulanate], Cetirizine hcl, and Zyrtec [cetirizine]  PAST MEDICAL HISTORY: Past Medical History:  Diagnosis Date   Allergic rhinitis    Colon polyp    Gastritis    Gout    Hyperlipidemia    Hypertension    Nicotine dependence    Vitamin D deficiency     PAST SURGICAL HISTORY: Past Surgical History:  Procedure Laterality Date   APPENDECTOMY     COLONOSCOPY  08/01/2012   Adenomatous Polyps, FH Colon Polyps (Daughter): CBF 72017   COLONOSCOPY  11/23/2015   Adenomatous Polyps, FH Colon Polyps (Daughter): CBF 10/2016; OV made 12/07/2016 @ 9am w/JWoodard PA (dw)   COLONOSCOPY  02/07/2017   Tubular adenoma of the colon/Hyperplastic colon polyp/Repeat 782yrs10yr   COLONOSCOPY  07/14/2020   Tubular adenoma/Hyperplastic polyp/PHx CP/Repeat 82yrs/97yr  EGD  05/24/2011   Gastritis: No repeat per PYO   EGD  11/23/2015   No repeat per RTE   R Trigger Thumb Release 04/25/16     TOOTH EXTRACTION     TUBAL LIGATION       FAMILY HISTORY: Family History  Problem Relation Age of Onset   Heart disease Mother    Dementia Mother    High blood pressure (Hypertension) Mother    Alzheimer's disease Mother    Diabetes Maternal Grandmother    Myocardial Infarction (Heart attack) Maternal Grandmother    Diabetes type II Maternal Grandmother    Breast cancer Neg Hx    Colon cancer Neg Hx    Skin cancer Neg Hx      SOCIAL HISTORY: Social History   Socioeconomic History   Marital status: Single  Occupational History   Occupation: Retired  Tobacco Use   Smoking status: Every Day    Packs/day: 0.50    Years: 12.00    Pack years: 6.00    Types: Cigarettes   Smokeless tobacco: Never  Substance and Sexual Activity   Alcohol use: No    Alcohol/week: 0.0 standard drinks   Drug use: No   Sexual activity: Not Currently    Partners: Male    PHYSICAL EXAM: Vitals:   01/14/21 0904  BP:  (!) 180/90  Pulse: 99   Body mass index is 31.64 kg/m. Weight: 78.5 kg (173 lb)   GENERAL: Alert, active, oriented x3  HEENT: Pupils equal reactive to light. Extraocular movements are intact. Sclera clear. Palpebral conjunctiva normal red color.Pharynx clear.  NECK: Supple with no palpable mass and no adenopathy.  LUNGS: Sound clear with no rales rhonchi or wheezes.  HEART: Regular rhythm S1 and S2 without murmur.  BREAST: breasts appear normal, no suspicious masses, no skin or nipple changes or axillary nodes.  ABDOMEN: Soft and depressible, nontender with no palpable mass, no hepatomegaly.  EXTREMITIES: Well-developed well-nourished symmetrical with no dependent edema.  NEUROLOGICAL: Awake alert oriented, facial expression symmetrical, moving all extremities.  REVIEW OF DATA: I have reviewed the following data today: Appointment on 11/04/2020  Component Date Value   WBC (  White Blood Cell Co* 11/04/2020 9.8    RBC (Red Blood Cell Coun* 11/04/2020 4.67    Hemoglobin 11/04/2020 13.1    Hematocrit 11/04/2020 41.6    MCV (Mean Corpuscular Vo* 11/04/2020 89.1    MCH (Mean Corpuscular He* 11/04/2020 28.1    MCHC (Mean Corpuscular H* 11/04/2020 31.5 (L)    Platelet Count 11/04/2020 251    RDW-CV (Red Cell Distrib* 11/04/2020 12.8    MPV (Mean Platelet Volum* 11/04/2020 11.6    Neutrophils 11/04/2020 5.78    Lymphocytes 11/04/2020 3.07    Monocytes 11/04/2020 0.74    Eosinophils 11/04/2020 0.15    Basophils 11/04/2020 0.07    Neutrophil % 11/04/2020 58.8    Lymphocyte % 11/04/2020 31.2    Monocyte % 11/04/2020 7.5    Eosinophil % 11/04/2020 1.5    Basophil% 11/04/2020 0.7    Immature Granulocyte % 11/04/2020 0.3    Immature Granulocyte Cou* 11/04/2020 0.03    Glucose 11/04/2020 91    Sodium 11/04/2020 144    Potassium 11/04/2020 3.6    Chloride 11/04/2020 104    Carbon Dioxide (CO2) 11/04/2020 29.6    Urea Nitrogen (BUN) 11/04/2020 11    Creatinine 11/04/2020 0.9     Glomerular Filtration Ra* 11/04/2020 74    Calcium 11/04/2020 9.5    AST  11/04/2020 23    ALT  11/04/2020 21    Alk Phos (alkaline Phosp* 11/04/2020 96    Albumin 11/04/2020 4.6    Bilirubin, Total 11/04/2020 0.6    Protein, Total 11/04/2020 6.9    A/G Ratio 11/04/2020 2.0    Cholesterol, Total 11/04/2020 132    Triglyceride 11/04/2020 69    HDL (High Density Lipopr* 11/04/2020 45.3    LDL Calculated 11/04/2020 73    VLDL Cholesterol 11/04/2020 14    Cholesterol/HDL Ratio 11/04/2020 2.9    Color 11/04/2020 Yellow    Clarity 11/04/2020 Clear    Specific Gravity 11/04/2020 <=1.005    pH, Urine 11/04/2020 7.0    Protein, Urinalysis 11/04/2020 Negative    Glucose, Urinalysis 11/04/2020 Negative    Ketones, Urinalysis 11/04/2020 Negative    Blood, Urinalysis 11/04/2020 Negative    Nitrite, Urinalysis 11/04/2020 Negative    Leukocyte Esterase, Urin* 11/04/2020 Small (!)    White Blood Cells, Urina* 11/04/2020 0-3    Red Blood Cells, Urinaly* 11/04/2020 None Seen    Bacteria, Urinalysis 11/04/2020 Rare (!)    Squamous Epithelial Cell* 11/04/2020 Rare    Hemoglobin A1C 11/04/2020 4.7    Average Blood Glucose (C* 11/04/2020 88    Uric Acid 11/04/2020 4.0      ASSESSMENT: Ms. Rothrock is a 75 y.o. female presenting for consultation for left breast papillary lesion with atypia.    Patient was oriented again about the pathology results. Surgical alternatives were discussed with patient including excisional biopsy. Surgical technique and post operative care was discussed with patient. Risk of surgery was discussed with patient including but not limited to: wound infection, seroma, hematoma, brachial plexopathy, mondor's disease (thrombosis of small veins of breast), chronic wound pain, breast lymphedema, altered sensation to the nipple and cosmesis among others.   Patient was oriented that the goal of the surgery is diagnostic and not therapeutic.  She was oriented about possible need of  further surgery if carcinoma is identified.  Patient and her daughter understood the plan and agreed to proceed.  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     Herbert Pun, MD  Electronically signed by Herbert Pun, MD

## 2021-01-20 ENCOUNTER — Encounter: Payer: Self-pay | Admitting: Urgent Care

## 2021-01-20 ENCOUNTER — Encounter
Admission: RE | Admit: 2021-01-20 | Discharge: 2021-01-20 | Disposition: A | Discharge status: Home / Self Care | Payer: Medicare PPO | Source: Ambulatory Visit | Attending: General Surgery | Admitting: General Surgery

## 2021-01-20 ENCOUNTER — Other Ambulatory Visit
Admission: RE | Admit: 2021-01-20 | Discharge: 2021-01-20 | Disposition: A | Discharge status: Home / Self Care | Payer: Medicare PPO | Source: Ambulatory Visit | Attending: General Surgery | Admitting: General Surgery

## 2021-01-20 ENCOUNTER — Other Ambulatory Visit: Payer: Self-pay

## 2021-01-20 DIAGNOSIS — Z01810 Encounter for preprocedural cardiovascular examination: Secondary | ICD-10-CM | POA: Insufficient documentation

## 2021-01-20 DIAGNOSIS — I447 Left bundle-branch block, unspecified: Secondary | ICD-10-CM

## 2021-01-20 HISTORY — DX: Left bundle-branch block, unspecified: I44.7

## 2021-01-20 NOTE — Patient Instructions (Addendum)
Your procedure is scheduled on: 01/27/21 - Wednesday Report to the Registration Desk on the 1st floor of the [facility]. To find out your arrival time, please call [phone] between 1PM - 3PM on: 01/26/21 - Tuesday  REMEMBER: Instructions that are not followed completely may result in serious medical risk, up to and including death; or upon the discretion of your surgeon and anesthesiologist your surgery may need to be rescheduled.  Do not eat food after midnight the night before surgery.  No gum chewing, lozengers or hard candies.  You may however, drink CLEAR liquids up to 2 hours before you are scheduled to arrive for your surgery. Do not drink anything within 2 hours of your scheduled arrival time.  Clear liquids include: - water  - apple juice without pulp - gatorade (not RED, PURPLE, OR BLUE) - black coffee or tea (Do NOT add milk or creamers to the coffee or tea) Do NOT drink anything that is not on this list.  TAKE THESE MEDICATIONS THE MORNING OF SURGERY WITH A SIP OF WATER:  - allopurinol (ZYLOPRIM) 100 MG tablet - amLODipine (NORVASC) 5 MG tablet - atorvastatin (LIPITOR) 40 MG tablet - fluticasone (FLONASE) 50 MCG/ACT nasal spray - omeprazole (PRILOSEC) 40 MG capsule, (take one the night before and one on the morning of surgery - helps to prevent nausea after surgery.)  Follow recommendations from Cardiologist, Pulmonologist or PCP regarding stopping Aspirin, Coumadin, Plavix, Eliquis, Pradaxa, or Pletal. Stop taking aspirin 81 MG 5 days prior to procedure.  One week prior to surgery: Stop Anti-inflammatories (NSAIDS) such as Advil, Aleve, Ibuprofen, Motrin, Naproxen, Naprosyn and Aspirin based products such as Excedrin, Goodys Powder, BC Powder.  Stop ANY OVER THE COUNTER supplements until after surgery.  You may take Tylenol as directed if needed for pain up until the day of surgery.  No Alcohol for 24 hours before or after surgery.  No Smoking including  e-cigarettes for 24 hours prior to surgery.  No chewable tobacco products for at least 6 hours prior to surgery.  No nicotine patches on the day of surgery.  Do not use any "recreational" drugs for at least a week prior to your surgery.  Please be advised that the combination of cocaine and anesthesia may have negative outcomes, up to and including death. If you test positive for cocaine, your surgery will be cancelled.  On the morning of surgery brush your teeth with toothpaste and water, you may rinse your mouth with mouthwash if you wish. Do not swallow any toothpaste or mouthwash.  Use CHG Soap or wipes as directed on instruction sheet.  Do not wear jewelry, make-up, hairpins, clips or nail polish.  Do not wear lotions, powders, or perfumes.   Do not shave body from the neck down 48 hours prior to surgery just in case you cut yourself which could leave a site for infection.  Also, freshly shaved skin may become irritated if using the CHG soap.  Contact lenses, hearing aids and dentures may not be worn into surgery.  Do not bring valuables to the hospital. [facility] is not responsible for any missing/lost belongings or valuables.    Notify your doctor if there is any change in your medical condition (cold, fever, infection).  Wear comfortable clothing (specific to your surgery type) to the hospital.  After surgery, you can help prevent lung complications by doing breathing exercises.  Take deep breaths and cough every 1-2 hours. Your doctor may order a device called an  Incentive Spirometer to help you take deep breaths. When coughing or sneezing, hold a pillow firmly against your incision with both hands. This is called splinting. Doing this helps protect your incision. It also decreases belly discomfort.  If you are being admitted to the hospital overnight, leave your suitcase in the car. After surgery it may be brought to your room.  If you are being discharged the day of  surgery, you will not be allowed to drive home. You will need a responsible adult (18 years or older) to drive you home and stay with you that night.   If you are taking public transportation, you will need to have a responsible adult (18 years or older) with you. Please confirm with your physician that it is acceptable to use public transportation.   Please call the [facility] at [phone] if you have any questions about these instructions.  Surgery Visitation Policy:  Patients undergoing a surgery or procedure may have one family member or support person with them as long as that person is not COVID-19 positive or experiencing its symptoms.  That person may remain in the waiting area during the procedure and may rotate out with other people.  Inpatient Visitation:    Visiting hours are 7 a.m. to 8 p.m. Up to two visitors ages 16+ are allowed at one time in a patient room. The visitors may rotate out with other people during the day. Visitors must check out when they leave, or other visitors will not be allowed. One designated support person may remain overnight. The visitor must pass COVID-19 screenings, use hand sanitizer when entering and exiting the patients room and wear a mask at all times, including in the patients room. Patients must also wear a mask when staff or their visitor are in the room. Masking is required regardless of vaccination status.

## 2021-01-21 NOTE — Progress Notes (Signed)
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 Herbert Pun, Brenda Combs.  In preparation for her procedure, patient presented to the PAT clinic on 01/20/2021 for labs and ECG.  Preoperative ECG showed sinus tachycardia at a rate of 102 bpm with a presumably new LBBB.  There are no ECGs available in the [facility] system or in [place] for review and comparison.  In review of her EMR, patient does not have a documented diagnosis of LBBB or previous MI.  It does not appear as if she has ever been seen in consultation by cardiology.  Patient with several cardiac risk factors including cardiac murmur, HTN, HLD, African-American race, obesity (BMI 31.86 kg/m), and current smoking history.   Given that patient has never been seen by cardiology, and the fact that there are no ECGs available for me to review in the system, reached out to surgery scheduler Brenda Combs) to see if there was perhaps an ECG on file with the [facility] system that I was unable to view.  I reached out by secure email, however to date, I have not received a response.  Additionally, I reached out to patient's PCP Brenda Combs, Brenda Combs) again to determine if there were any ECG tracings on file, and to determine if patient has ever been known to have an LBBB in the past. Tracing was faxed to PCP on 01/20/2021 for review, however I did not hear anything back.  Spoke with the office on  01/21/2021 and learned that fax was never received by the office.  Clearance form and ECG was walked to [facility] by PAT APP.  As of 1700 on 01/21/2021, I still have not heard back from the PCPs office.  Call placed to office again and learned that this was a new finding for patient and that she had been referred to cardiology.  Patient to see cardiology on 01/27/2021.  Additionally, office indicated that primary attending surgeon Brenda Moment, Brenda Combs) had been notified of need to postpone surgery pending cardiovascular evaluation.   Impression and Plan:  Brenda Combs presented for preoperative testing on 01/20/2021 at which time she was found to have an abnormal ECG.  Tracing has been shared with patient's PCP who confirmed that this is a new finding for this patient.  She has subsequently been referred to cardiology for further evaluation and potential cardiovascular testing.  Per PCP office, surgeon has been notified that patient will need to see cardiology prior to undergoing surgical intervention.  In efforts to ensure that surgeon is fully aware, I will plan on forwarding a copy of this note to Dr. Windell Combs for review.  We will plan on following up on patient plan of care/disposition following her appointment with cardiology that is scheduled on 01/27/2021.  Honor Loh, MSN, APRN, FNP-C, CEN [facility]  Peri-operative Services Nurse Practitioner Phone: [phone] 01/21/21 5:35 PM  NOTE: This note has been prepared using Dragon dictation software. Despite my best ability to proofread, there is always  the potential that unintentional transcriptional errors may still occur from this process.

## 2021-01-26 ENCOUNTER — Encounter: Payer: Self-pay | Admitting: General Surgery

## 2021-01-26 DIAGNOSIS — I2510 Atherosclerotic heart disease of native coronary artery without angina pectoris: Secondary | ICD-10-CM | POA: Insufficient documentation

## 2021-01-26 DIAGNOSIS — I6523 Occlusion and stenosis of bilateral carotid arteries: Secondary | ICD-10-CM | POA: Insufficient documentation

## 2021-01-27 ENCOUNTER — Ambulatory Visit: Payer: Medicare PPO

## 2021-01-27 ENCOUNTER — Ambulatory Visit: Admission: RE | Admit: 2021-01-27 | Payer: Medicare PPO | Source: Ambulatory Visit

## 2021-02-15 ENCOUNTER — Encounter: Payer: Self-pay | Admitting: General Surgery

## 2021-02-15 DIAGNOSIS — I428 Other cardiomyopathies: Secondary | ICD-10-CM

## 2021-02-15 HISTORY — DX: Other cardiomyopathies: I42.8

## 2021-02-23 ENCOUNTER — Encounter: Payer: Self-pay | Admitting: General Surgery

## 2021-02-23 DIAGNOSIS — I428 Other cardiomyopathies: Secondary | ICD-10-CM | POA: Insufficient documentation

## 2021-02-23 NOTE — Progress Notes (Signed)
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 Herbert Pun, MD.  Patient presented to the PAT clinic on 01/20/2021 for preoperative testing including ECG.  ECG revealed new LBBB, thus procedure was postponed and patient referred to cardiology.  Patient was seen and preoperative consult on 01/26/2021 by Dr. Serafina Royals, MD; notes reviewed.  Patient with shortness of breath with moderate exertion that began approximately 1 month prior to her visit. Patient also complained of exertional chest pressure/discomfort and significant fatigue.  Given symptom constellation and new LBBB on her ECG, the decision was made to pursue further work-up via myocardial perfusion imaging study and TTE.  Since being seen by cardiology, patient has underwent both of the cardiovascular studies performed as recommended. Patient was seen in follow-up consult by cardiology on 02/23/2021; notes reviewed. Blood pressure was elevated at 170/90 on prescribed CCB, ARB, and diuretic therapies. Patient on a statin for her HLD and ASCVD prevention. She is not diabetic. Results from previous testing reviewed with patient.  Patient with newly diagnosed NICM. Cardiologist noted that patient able to achieve at least 4 METS of activity without angina/anginal equivalent symptoms.  Dr. Nehemiah Massed did not make any changes to patient's current medication regimen.  Patient to follow-up with  outpatient cardiology in 4 months or sooner if needed.  Imaging / Procedures:  MYOCARDIAL PERFUSION IMAGING STUDY (LEXISCAN) performed on 02/15/2021 Mild global left ventricular systolic dysfunction with an ejection fraction of 21% Global hypokinesis No artifacts noted Left ventricular cavity size normal Indeterminate treadmill ECG due to baseline ECG changes and LBBB Normal myocardial perfusion imaging study without evidence of myocardial ischemia  TRANSTHORACIC ECHOCARDIOGRAM performed on 02/15/2021 LVEF 35-40% Moderate left ventricular systolic dysfunction with moderate LVH Normal right ventricular systolic function Trivial AR Mild MR, TR, and PR No significant transvalvular gradient to suggest mitral or aortic valve stenosis No pericardial effusion  Impression and Plan:  Brenda Combs has been referred for pre-anesthesia review and clearance prior to her undergoing the planned anesthetic and procedural courses. Available labs, pertinent testing, and imaging results were personally reviewed by me.  Patient has been appropriately cleared by cardiology and as follows, "the patient is at the lowest risk possible for perioperative cardiovascular complications with the planned procedure.  The overall risk her procedure is low (<1%).  Currently has no evidence active and/or significant angina and/or congestive heart failure. Patient may proceed to surgery without restriction or need for further cardiovascular testing and an overall LOW risk".  Of note, patient currently on daily antiplatelet therapy.  Cardiology has cleared patient to hold this medication for 4 days prior to procedure with plans to restart as soon as postoperatively respectively minimized by primary attending surgeon.  Patient is aware that her last dose of ASA will be on 02/24/2021.  Based on clinical review performed today (02/23/21), barring any significant acute changes in the patient's overall condition, it is anticipated  that she will be able to proceed with the planned surgical intervention. Any acute changes  in clinical condition may necessitate her procedure being postponed and/or cancelled. Patient will meet with anesthesia team (MD and/or CRNA) on this day of her procedure for preoperative evaluation/assessment.   Pre-surgical instructions were reviewed with the patient during her PAT appointment and questions were fielded by PAT clinical staff. Patient was advised that if any questions or concerns arise prior to her procedure then she should return a call to PAT and/or her surgeon's office to discuss.  Honor Loh, MSN, APRN, FNP-C, CEN [facility]  Peri-operative Services Nurse Practitioner Phone: [phone] 02/23/21 3:16 PM  NOTE: This note has been prepared using Dragon dictation software. Despite my best ability to proofread, there is always the potential that unintentional transcriptional errors may still occur from this process.

## 2021-03-01 ENCOUNTER — Ambulatory Visit
Admission: RE | Admit: 2021-03-01 | Discharge: 2021-03-01 | Disposition: A | Discharge status: Home / Self Care | Payer: Medicare PPO | Attending: General Surgery | Admitting: General Surgery

## 2021-03-01 ENCOUNTER — Ambulatory Visit
Admission: RE | Admit: 2021-03-01 | Discharge: 2021-03-01 | Disposition: A | Discharge status: Home / Self Care | Payer: Medicare PPO | Source: Ambulatory Visit | Attending: General Surgery | Admitting: General Surgery

## 2021-03-01 ENCOUNTER — Ambulatory Visit: Payer: Medicare PPO | Admitting: Urgent Care

## 2021-03-01 ENCOUNTER — Other Ambulatory Visit: Payer: Self-pay

## 2021-03-01 ENCOUNTER — Encounter
Admission: RE | Disposition: A | Discharge status: Home / Self Care | Payer: Self-pay | Source: Home / Self Care | Attending: General Surgery

## 2021-03-01 ENCOUNTER — Encounter: Payer: Self-pay | Admitting: General Surgery

## 2021-03-01 DIAGNOSIS — R0602 Shortness of breath: Secondary | ICD-10-CM | POA: Insufficient documentation

## 2021-03-01 DIAGNOSIS — C50912 Malignant neoplasm of unspecified site of left female breast: Secondary | ICD-10-CM | POA: Diagnosis not present

## 2021-03-01 DIAGNOSIS — Z6831 Body mass index (BMI) 31.0-31.9, adult: Secondary | ICD-10-CM | POA: Diagnosis not present

## 2021-03-01 DIAGNOSIS — I10 Essential (primary) hypertension: Secondary | ICD-10-CM | POA: Insufficient documentation

## 2021-03-01 DIAGNOSIS — E669 Obesity, unspecified: Secondary | ICD-10-CM | POA: Insufficient documentation

## 2021-03-01 DIAGNOSIS — I499 Cardiac arrhythmia, unspecified: Secondary | ICD-10-CM | POA: Diagnosis not present

## 2021-03-01 DIAGNOSIS — F17210 Nicotine dependence, cigarettes, uncomplicated: Secondary | ICD-10-CM | POA: Diagnosis not present

## 2021-03-01 DIAGNOSIS — D242 Benign neoplasm of left breast: Secondary | ICD-10-CM

## 2021-03-01 DIAGNOSIS — K219 Gastro-esophageal reflux disease without esophagitis: Secondary | ICD-10-CM | POA: Diagnosis not present

## 2021-03-01 DIAGNOSIS — I2510 Atherosclerotic heart disease of native coronary artery without angina pectoris: Secondary | ICD-10-CM | POA: Diagnosis not present

## 2021-03-01 DIAGNOSIS — E785 Hyperlipidemia, unspecified: Secondary | ICD-10-CM | POA: Insufficient documentation

## 2021-03-01 HISTORY — DX: Occlusion and stenosis of bilateral carotid arteries: I65.23

## 2021-03-01 HISTORY — DX: Shortness of breath: R06.02

## 2021-03-01 HISTORY — DX: Atherosclerotic heart disease of native coronary artery without angina pectoris: I25.10

## 2021-03-01 HISTORY — PX: BREAST BIOPSY: SHX20

## 2021-03-01 LAB — HEPATITIS B SURFACE ANTIGEN: Hepatitis B Surface Ag: NONREACTIVE

## 2021-03-01 LAB — RAPID HIV SCREEN (HIV 1/2 AB+AG)
HIV 1/2 Antibodies: NONREACTIVE
HIV-1 P24 Antigen - HIV24: NONREACTIVE

## 2021-03-01 SURGERY — BREAST BIOPSY WITH NEEDLE LOCALIZATION
Anesthesia: General | Site: Breast | Laterality: Left

## 2021-03-01 MED ORDER — ONDANSETRON HCL 4 MG/2ML IJ SOLN
INTRAMUSCULAR | Status: DC | PRN
  Administered 2021-03-01: 4 mg via INTRAVENOUS

## 2021-03-01 MED ORDER — FENTANYL CITRATE (PF) 100 MCG/2ML IJ SOLN
INTRAMUSCULAR | Status: DC | PRN
Start: 2021-03-01 — End: 2021-03-01
  Administered 2021-03-01: 25 ug via INTRAVENOUS

## 2021-03-01 MED ORDER — GLYCOPYRROLATE 0.2 MG/ML IJ SOLN
INTRAMUSCULAR | Status: DC | PRN
  Administered 2021-03-01: .2 mg via INTRAVENOUS

## 2021-03-01 MED ORDER — FENTANYL CITRATE (PF) 100 MCG/2ML IJ SOLN
INTRAMUSCULAR | Status: AC
  Administered 2021-03-01: 25 ug via INTRAVENOUS
  Filled 2021-03-01: qty 2

## 2021-03-01 MED ORDER — PHENYLEPHRINE HCL (PRESSORS) 10 MG/ML IV SOLN
INTRAVENOUS | Status: DC | PRN
Start: 2021-03-01 — End: 2021-03-01
  Administered 2021-03-01 (×3): 80 ug via INTRAVENOUS

## 2021-03-01 MED ORDER — CHLORHEXIDINE GLUCONATE 0.12 % MT SOLN
OROMUCOSAL | Status: AC
  Administered 2021-03-01: 15 mL via OROMUCOSAL
  Filled 2021-03-01: qty 15

## 2021-03-01 MED ORDER — LIDOCAINE HCL (CARDIAC) PF 100 MG/5ML IV SOSY
PREFILLED_SYRINGE | INTRAVENOUS | Status: DC | PRN
  Administered 2021-03-01: 60 mg via INTRAVENOUS

## 2021-03-01 MED ORDER — FENTANYL CITRATE (PF) 100 MCG/2ML IJ SOLN
INTRAMUSCULAR | Status: AC
  Filled 2021-03-01: qty 2

## 2021-03-01 MED ORDER — CHLORHEXIDINE GLUCONATE 0.12 % MT SOLN: 15.0000 mL | Freq: Once | OROMUCOSAL | Status: AC

## 2021-03-01 MED ORDER — CEFAZOLIN SODIUM-DEXTROSE 2-4 GM/100ML-% IV SOLN
2.0000 g | INTRAVENOUS | Status: AC
  Administered 2021-03-01: 2 g via INTRAVENOUS

## 2021-03-01 MED ORDER — FENTANYL CITRATE (PF) 100 MCG/2ML IJ SOLN
25.0000 ug | INTRAMUSCULAR | Status: DC | PRN
  Administered 2021-03-01 (×2): 25 ug via INTRAVENOUS

## 2021-03-01 MED ORDER — GLYCOPYRROLATE 0.2 MG/ML IJ SOLN
INTRAMUSCULAR | Status: AC
  Filled 2021-03-01: qty 1

## 2021-03-01 MED ORDER — DEXAMETHASONE SODIUM PHOSPHATE 10 MG/ML IJ SOLN
INTRAMUSCULAR | Status: DC | PRN
  Administered 2021-03-01: 5 mg via INTRAVENOUS

## 2021-03-01 MED ORDER — DEXAMETHASONE SODIUM PHOSPHATE 10 MG/ML IJ SOLN
INTRAMUSCULAR | Status: AC
  Filled 2021-03-01: qty 1

## 2021-03-01 MED ORDER — PROPOFOL 10 MG/ML IV BOLUS
INTRAVENOUS | Status: DC | PRN
Start: 2021-03-01 — End: 2021-03-01
  Administered 2021-03-01: 100 mg via INTRAVENOUS

## 2021-03-01 MED ORDER — EPHEDRINE 5 MG/ML INJ
INTRAVENOUS | Status: AC
  Filled 2021-03-01: qty 5

## 2021-03-01 MED ORDER — ORAL CARE MOUTH RINSE: 15.0000 mL | Freq: Once | OROMUCOSAL | Status: AC

## 2021-03-01 MED ORDER — LACTATED RINGERS IV SOLN: INTRAVENOUS | Status: DC

## 2021-03-01 MED ORDER — ONDANSETRON HCL 4 MG/2ML IJ SOLN
INTRAMUSCULAR | Status: AC
  Filled 2021-03-01: qty 2

## 2021-03-01 MED ORDER — EPHEDRINE SULFATE (PRESSORS) 50 MG/ML IJ SOLN
INTRAMUSCULAR | Status: DC | PRN
  Administered 2021-03-01 (×4): 5 mg via INTRAVENOUS

## 2021-03-01 MED ORDER — CEFAZOLIN SODIUM-DEXTROSE 2-4 GM/100ML-% IV SOLN
INTRAVENOUS | Status: AC
  Filled 2021-03-01: qty 100

## 2021-03-01 MED ORDER — ONDANSETRON HCL 4 MG/2ML IJ SOLN: 4.0000 mg | Freq: Once | INTRAMUSCULAR | Status: DC | PRN

## 2021-03-01 MED ORDER — ACETAMINOPHEN 10 MG/ML IV SOLN
INTRAVENOUS | Status: DC | PRN
  Administered 2021-03-01: 1000 mg via INTRAVENOUS

## 2021-03-01 MED ORDER — BUPIVACAINE HCL (PF) 0.5 % IJ SOLN
INTRAMUSCULAR | Status: AC
  Filled 2021-03-01: qty 30

## 2021-03-01 MED ORDER — HYDROCODONE-ACETAMINOPHEN 5-325 MG PO TABS: 1.0000 | ORAL_TABLET | ORAL | 0 refills | Status: AC | PRN

## 2021-03-01 MED ORDER — BUPIVACAINE-EPINEPHRINE (PF) 0.5% -1:200000 IJ SOLN
INTRAMUSCULAR | Status: DC | PRN
  Administered 2021-03-01: 30 mL

## 2021-03-01 SURGICAL SUPPLY — 45 items
ADH SKN CLS APL DERMABOND .7 (GAUZE/BANDAGES/DRESSINGS) ×1
APL PRP STRL LF DISP 70% ISPRP (MISCELLANEOUS) ×1
BLADE SURG 15 STRL LF DISP TIS (BLADE) ×1 IMPLANT
BLADE SURG 15 STRL SS (BLADE) ×2
CHLORAPREP W/TINT 26 (MISCELLANEOUS) ×2 IMPLANT
CNTNR SPEC 2.5X3XGRAD LEK (MISCELLANEOUS)
CONT SPEC 4OZ STER OR WHT (MISCELLANEOUS)
CONT SPEC 4OZ STRL OR WHT (MISCELLANEOUS)
CONTAINER SPEC 2.5X3XGRAD LEK (MISCELLANEOUS) ×1 IMPLANT
DERMABOND ADVANCED (GAUZE/BANDAGES/DRESSINGS) ×1
DERMABOND ADVANCED .7 DNX12 (GAUZE/BANDAGES/DRESSINGS) ×1 IMPLANT
DEVICE DUBIN SPECIMEN MAMMOGRA (MISCELLANEOUS) ×2 IMPLANT
DRAPE LAPAROTOMY TRNSV 106X77 (MISCELLANEOUS) ×2 IMPLANT
ELECT CAUTERY BLADE TIP 2.5 (TIP) ×2
ELECT REM PT RETURN 9FT ADLT (ELECTROSURGICAL) ×2
ELECTRODE CAUTERY BLDE TIP 2.5 (TIP) ×1 IMPLANT
ELECTRODE REM PT RTRN 9FT ADLT (ELECTROSURGICAL) ×1 IMPLANT
GAUZE 4X4 16PLY ~~LOC~~+RFID DBL (SPONGE) ×2 IMPLANT
GLOVE SURG ENC MOIS LTX SZ6.5 (GLOVE) ×6 IMPLANT
GLOVE SURG UNDER POLY LF SZ6.5 (GLOVE) ×7 IMPLANT
GOWN STRL REUS W/ TWL LRG LVL3 (GOWN DISPOSABLE) ×3 IMPLANT
GOWN STRL REUS W/TWL LRG LVL3 (GOWN DISPOSABLE) ×10
KIT MARKER MARGIN INK (KITS) ×1 IMPLANT
KIT TURNOVER KIT A (KITS) ×2 IMPLANT
LABEL OR SOLS (LABEL) ×2 IMPLANT
MANIFOLD NEPTUNE II (INSTRUMENTS) ×2 IMPLANT
MARKER MARGIN CORRECT CLIP (MARKER) ×1 IMPLANT
NDL HYPO 25X1 1.5 SAFETY (NEEDLE) ×1 IMPLANT
NEEDLE HYPO 25X1 1.5 SAFETY (NEEDLE) ×2 IMPLANT
PACK BASIN MINOR ARMC (MISCELLANEOUS) ×2 IMPLANT
RETRACTOR RING XSMALL (MISCELLANEOUS) IMPLANT
RTRCTR WOUND ALEXIS 13CM XS SH (MISCELLANEOUS) ×2
SUT ETHILON 3-0 FS-10 30 BLK (SUTURE) ×2
SUT MNCRL 4-0 (SUTURE) ×2
SUT MNCRL 4-0 27XMFL (SUTURE) ×1
SUT SILK 2 0 SH (SUTURE) ×2 IMPLANT
SUT VIC AB 3-0 SH 27 (SUTURE) ×2
SUT VIC AB 3-0 SH 27X BRD (SUTURE) ×1 IMPLANT
SUTURE EHLN 3-0 FS-10 30 BLK (SUTURE) ×1 IMPLANT
SUTURE MNCRL 4-0 27XMF (SUTURE) ×1 IMPLANT
SYR 10ML LL (SYRINGE) ×2 IMPLANT
SYR BULB IRRIG 60ML STRL (SYRINGE) ×2 IMPLANT
TRAP NEPTUNE SPECIMEN COLLECT (MISCELLANEOUS) ×2 IMPLANT
WATER STERILE IRR 1000ML POUR (IV SOLUTION) ×2 IMPLANT
WATER STERILE IRR 500ML POUR (IV SOLUTION) ×1 IMPLANT

## 2021-03-01 NOTE — Anesthesia Procedure Notes (Signed)
Procedure Name: LMA Insertion Date/Time: 03/01/2021 12:07 PM Performed by: Hedda Slade, CRNA Pre-anesthesia Checklist: Patient identified, Patient being monitored, Timeout performed, Emergency Drugs available and Suction available Patient Re-evaluated:Patient Re-evaluated prior to induction Oxygen Delivery Method: Circle system utilized Preoxygenation: Pre-oxygenation with 100% oxygen Induction Type: IV induction Ventilation: Mask ventilation without difficulty LMA: LMA inserted LMA Size: 3.5 Tube type: Oral Number of attempts: 1 Placement Confirmation: positive ETCO2 and breath sounds checked- equal and bilateral Tube secured with: Tape Dental Injury: Teeth and Oropharynx as per pre-operative assessment

## 2021-03-01 NOTE — Anesthesia Preprocedure Evaluation (Signed)
Anesthesia Evaluation  Patient identified by MRN, date of birth, ID band Patient awake    Reviewed: Allergy & Precautions, NPO status , Patient's Chart, lab work & pertinent test results  Airway Mallampati: II  TM Distance: >3 FB Neck ROM: full    Dental  (+) Teeth Intact   Pulmonary neg pulmonary ROS, shortness of breath, Current Smoker and Patient abstained from smoking.,    Pulmonary exam normal  + decreased breath sounds      Cardiovascular hypertension, Pt. on medications + CAD  negative cardio ROS Normal cardiovascular exam+ dysrhythmias  Rhythm:Regular Rate:Normal  LBBB, EF less than 30%   Neuro/Psych negative neurological ROS  negative psych ROS   GI/Hepatic negative GI ROS, Neg liver ROS, GERD  ,  Endo/Other  negative endocrine ROS  Renal/GU negative Renal ROS  negative genitourinary   Musculoskeletal   Abdominal   Peds negative pediatric ROS (+)  Hematology negative hematology ROS (+)   Anesthesia Other Findings Past Medical History: No date: Arthritis No date: Bilateral carotid artery stenosis No date: CAD (coronary artery disease) No date: GERD (gastroesophageal reflux disease) No date: Gout No date: Heart murmur No date: History of abnormal Pap smear No date: Hyperlipidemia No date: Hypertension 01/20/2021: LBBB (left bundle branch block)     Comment:  a.) noted on preoperative ECG 02/15/2021: NICM (nonischemic cardiomyopathy) ([place])     Comment:  a.) TTE 02/15/2021: ED 22-02%; mod LV systolic               dysfunction with LVH; mild panvalvular regurgitation. b.)              Lexiscan 02/15/2021: EF 21%; global LV systolic               dysfunction; no significant ischemia. No date: SOB (shortness of breath) on exertion  Past Surgical History: No date: APPENDECTOMY 01/07/2021: BREAST BIOPSY; Left     Comment:  stereo bx calcs, coil marker, path pending 01/07/2021: BREAST BIOPSY; Left      Comment:  [place] bx of calcs, heart marker, path pending 11/23/2015: COLONOSCOPY WITH PROPOFOL; N/A     Comment:  Procedure: COLONOSCOPY WITH PROPOFOL;  Surgeon: Manya Silvas, MD;  Location: [facility] ENDOSCOPY;  Service:               Endoscopy;  Laterality: N/A; 02/07/2017: COLONOSCOPY WITH PROPOFOL; N/A     Comment:  Procedure: COLONOSCOPY WITH PROPOFOL;  Surgeon:               [name], MD;  Location: [facility] ENDOSCOPY;                Service: Endoscopy;  Laterality: N/A; 11/23/2015: ESOPHAGOGASTRODUODENOSCOPY (EGD) WITH PROPOFOL; N/A     Comment:  Procedure: ESOPHAGOGASTRODUODENOSCOPY (EGD) WITH               PROPOFOL;  Surgeon: Manya Silvas, MD;  Location: [facility]              ENDOSCOPY;  Service: Endoscopy;  Laterality: N/A; No date: TUBAL LIGATION No date: WISDOM TOOTH EXTRACTION     Comment:  x 2  BMI    Body Mass Index: 31.65 kg/m      Reproductive/Obstetrics negative OB ROS                             Anesthesia  Physical Anesthesia Plan  ASA: 3  Anesthesia Plan: General   Post-op Pain Management:    Induction: Intravenous  PONV Risk Score and Plan: Dexamethasone, Ondansetron, Midazolam and Treatment may vary due to age or medical condition  Airway Management Planned: LMA  Additional Equipment:   Intra-op Plan:   Post-operative Plan: Extubation in OR  Informed Consent: I have reviewed the patients History and Physical, chart, labs and discussed the procedure including the risks, benefits and alternatives for the proposed anesthesia with the patient or authorized representative who has indicated his/her understanding and acceptance.     Dental Advisory Given  Plan Discussed with: CRNA and Surgeon  Anesthesia Plan Comments:         Anesthesia Quick Evaluation

## 2021-03-01 NOTE — Transfer of Care (Signed)
Immediate Anesthesia Transfer of Care Note  [name]  Procedure(s) Performed: Excisional BREAST BIOPSY WITH NEEDLE LOCALIZATION (bracket) (Left: Breast)  Patient Location: PACU  Anesthesia Type:General  Level of Consciousness: awake, alert  and oriented  Airway & Oxygen Therapy: Patient Spontanous Breathing and Patient connected to face mask oxygen  Post-op Assessment: Report given to RN and Post -op Vital signs reviewed and stable  Post vital signs: Reviewed and stable  Last Vitals:  Vitals Value Taken Time  BP 137/89 03/01/21 1331  Temp 36.2 C 03/01/21 1331  Pulse 87 03/01/21 1331  Resp 23 03/01/21 1331  SpO2 100 % 03/01/21 1331  Vitals shown include unvalidated device data.  Last Pain:  Vitals:   03/01/21 1331  TempSrc:   PainSc: 0-No pain         Complications: No notable events documented.

## 2021-03-01 NOTE — Discharge Instructions (Addendum)
  Diet: Resume home heart healthy regular diet.   Activity: No heavy lifting >20 pounds (children, pets, laundry, garbage) or strenuous activity until follow-up, but light activity and walking are encouraged. Do not drive or drink alcohol if taking narcotic pain medications.  Wound care: May shower with soapy water and pat dry (do not rub incisions), but no baths or submerging incision underwater until follow-up. (no swimming)   Medications: Resume all home medications. For mild to moderate pain: acetaminophen (Tylenol) ***or ibuprofen (if no kidney disease). Combining Tylenol with alcohol can substantially increase your risk of causing liver disease. Narcotic pain medications, if prescribed, can be used for severe pain, though may cause nausea, constipation, and drowsiness. Do not combine Tylenol and Norco within a 6 hour period as Norco contains Tylenol. If you do not need the narcotic pain medication, you do not need to fill the prescription.  Call office ([phone]) at any time if any questions, worsening pain, fevers/chills, bleeding, drainage from incision site, or other concerns.   AMBULATORY SURGERY  DISCHARGE INSTRUCTIONS   The drugs that you were given will stay in your system until tomorrow so for the next 24 hours you should not:  Drive an automobile Make any legal decisions Drink any alcoholic beverage   You may resume regular meals tomorrow.  Today it is better to start with liquids and gradually work up to solid foods.  You may eat anything you prefer, but it is better to start with liquids, then soup and crackers, and gradually work up to solid foods.   Please notify your doctor immediately if you have any unusual bleeding, trouble breathing, redness and pain at the surgery site, drainage, fever, or pain not relieved by medication.    Additional Instructions:        Please contact your physician with any problems or Same Day Surgery at [phone], Monday  through Friday 6 am to 4 pm, or [facility] number at [phone].  

## 2021-03-01 NOTE — H&P (Signed)
PATIENT PROFILE: Brenda Combs is a 76 y.o. female who presents to the Clinic for consultation at the request of Dr. Carrie Combs for evaluation of left breast papilloma with atypia.  PCP: Brenda Nightingale, PA  HISTORY OF PRESENT ILLNESS: Brenda Combs reports she had her usual screening mammogram. Screening mammogram shows concerning calcification. This led to diagnostic mammogram and ultrasound of the left breast. Mammogram and ultrasound confirmed a 4 cm area of linear calcification with a small mass associated with the calcifications. Biopsy of 2 spots shows papillary lesion with atypia. No invasive carcinoma was identified. Patient denies any nipple discharge, palpable masses or skin changes.  Family history of breast cancer: None Family history of other cancers: None Menarche: 48 to 76 years old Menopause: In her 107s Used OCP: Denies Used estrogen and progesterone therapy: Denies Number of pregnancies: 3 Age of her pregnancies: 64 History of Radiation to the chest: None No previous breast biopsy  PROBLEM LIST: Problem List Date Reviewed: 10/13/2017  Noted  Osteopenia 04/13/2015  Hyperlipidemia 11/20/2013  Gout 11/20/2013  Gastritis Unknown  Allergic rhinitis Unknown  Nicotine dependence Unknown  Vitamin D deficiency Unknown  Hypertension Unknown   GENERAL REVIEW OF SYSTEMS:   General ROS: negative for - chills, fatigue, fever, weight gain or weight loss Allergy and Immunology ROS: negative for - hives  Hematological and Lymphatic ROS: negative for - bleeding problems or bruising, negative for palpable nodes Endocrine ROS: negative for - heat or cold intolerance, hair changes Respiratory ROS: negative for - cough, shortness of breath or wheezing Cardiovascular ROS: no chest pain or palpitations GI ROS: negative for nausea, vomiting, abdominal pain, diarrhea, constipation Musculoskeletal ROS: negative for - joint swelling or muscle pain Neurological ROS: negative  for - confusion, syncope Dermatological ROS: negative for pruritus and rash Psychiatric: negative for anxiety, depression, difficulty sleeping and memory loss  MEDICATIONS: Current Outpatient Medications  Medication Sig Dispense Refill   allopurinoL (ZYLOPRIM) 100 MG tablet Take 1 tablet (100 mg total) by mouth once daily 90 tablet 1   amLODIPine (NORVASC) 5 MG tablet Take 1.5 tablets (7.5 mg total) by mouth once daily 135 tablet 1   aspirin 81 MG EC tablet Take 81 mg by mouth once daily.   atorvastatin (LIPITOR) 40 MG tablet Take 1 tablet (40 mg total) by mouth once daily 90 tablet 1   cyclobenzaprine (FLEXERIL) 5 MG tablet Take 1 tablet (5 mg total) by mouth 2 (two) times daily as needed 60 tablet 2   fluticasone propionate (FLONASE) 50 mcg/actuation nasal spray Place 1 spray into both nostrils once daily 16 g 2   naproxen (NAPROSYN) 500 MG tablet Take 1 tablet (500 mg total) by mouth 2 (two) times daily 90 tablet 1   olmesartan (BENICAR) 40 MG tablet Take 1 tablet (40 mg total) by mouth once daily 90 tablet 1   omeprazole (PRILOSEC) 40 MG DR capsule Take 1 capsule (40 mg total) by mouth 2 (two) times daily before meals 180 capsule 3   sodium, potassium, and magnesium (SUPREP) oral solution Take 1 Bottle by mouth as directed One kit contains 2 bottles. Take both bottles at the times instructed by your provider. 354 mL 0   spironolactone (ALDACTONE) 25 MG tablet Take 1 tablet (25 mg total) by mouth once daily 90 tablet 1   ACETAMINOPHEN/CHLORPHENIRAMINE (CORICIDIN ORAL) Take 1 tablet by mouth once daily.  (Patient not taking: Reported on 11/11/2020)   calcium carbonate-vitamin D3 (CALTRATE 600+D) 600 mg(1,550m) -200 unit tablet Take  1 tablet by mouth once daily (Patient not taking: Reported on 05/11/2020)   predniSONE (DELTASONE) 20 MG tablet Take 18m x 3 days, 239mx 3 days, 1052m 3 days. (Patient not taking: Reported on 11/11/2020) 12 tablet 0   No current facility-administered  medications for this visit.   ALLERGIES: Augmentin [amoxicillin-pot clavulanate], Cetirizine hcl, and Zyrtec [cetirizine]  PAST MEDICAL HISTORY: Past Medical History:  Diagnosis Date   Allergic rhinitis   Colon polyp   Gastritis   Gout   Hyperlipidemia   Hypertension   Nicotine dependence   Vitamin D deficiency   PAST SURGICAL HISTORY: Past Surgical History:  Procedure Laterality Date   APPENDECTOMY   COLONOSCOPY 08/01/2012  Adenomatous Polyps, FH Colon Polyps (Daughter): CBF 72017   COLONOSCOPY 11/23/2015  Adenomatous Polyps, FH Colon Polyps (Daughter): CBF 10/2016; OV made 12/07/2016 @ 9am w/JWoodard PA (dw)   COLONOSCOPY 02/07/2017  Tubular adenoma of the colon/Hyperplastic colon polyp/Repeat 45yr30yrS   COLONOSCOPY 07/14/2020  Tubular adenoma/Hyperplastic polyp/PHx CP/Repeat 30yrs844yr   EGD 05/24/2011  Gastritis: No repeat per PYO   EGD 11/23/2015  No repeat per RTE   R Trigger Thumb Release 04/25/16   TOOTH EXTRACTION   TUBAL LIGATION    FAMILY HISTORY: Family History  Problem Relation Age of Onset   Heart disease Mother   Dementia Mother   High blood pressure (Hypertension) Mother   Alzheimer's disease Mother   Diabetes Maternal Grandmother   Myocardial Infarction (Heart attack) Maternal Grandmother   Diabetes type II Maternal Grandmother   Breast cancer Neg Hx   Colon cancer Neg Hx   Skin cancer Neg Hx    SOCIAL HISTORY: Social History   Socioeconomic History   Marital status: Single  Occupational History   Occupation: Retired  Tobacco Use   Smoking status: Every Day  Packs/day: 0.50  Years: 12.00  Pack years: 6.00  Types: Cigarettes   Smokeless tobacco: Never  Substance and Sexual Activity   Alcohol use: No  Alcohol/week: 0.0 standard drinks   Drug use: No   Sexual activity: Not Currently  Partners: Male   PHYSICAL EXAM: Vitals:  01/14/21 0904  BP: (!) 180/90  Pulse: 99   Body mass index is 31.64 kg/m. Weight: 78.5 kg (173 lb)    GENERAL: Alert, active, oriented x3  HEENT: Pupils equal reactive to light. Extraocular movements are intact. Sclera clear. Palpebral conjunctiva normal red color.Pharynx clear.  NECK: Supple with no palpable mass and no adenopathy.  LUNGS: Sound clear with no rales rhonchi or wheezes.  HEART: Regular rhythm S1 and S2 without murmur.  BREAST: breasts appear normal, no suspicious masses, no skin or nipple changes or axillary nodes.  ABDOMEN: Soft and depressible, nontender with no palpable mass, no hepatomegaly.  EXTREMITIES: Well-developed well-nourished symmetrical with no dependent edema.  NEUROLOGICAL: Awake alert oriented, facial expression symmetrical, moving all extremities.  REVIEW OF DATA: I have reviewed the following data today: Appointment on 11/04/2020  Component Date Value   WBC (White Blood Cell Co* 11/04/2020 9.8   RBC (Red Blood Cell Coun* 11/04/2020 4.67   Hemoglobin 11/04/2020 13.1   Hematocrit 11/04/2020 41.6   MCV (Mean Corpuscular Vo* 11/04/2020 89.1   MCH (Mean Corpuscular He* 11/04/2020 28.1   MCHC (Mean Corpuscular H* 11/04/2020 31.5 (L)   Platelet Count 11/04/2020 251   RDW-CV (Red Cell Distrib* 11/04/2020 12.8   MPV (Mean Platelet Volum* 11/04/2020 11.6   Neutrophils 11/04/2020 5.78   Lymphocytes 11/04/2020 3.07   Monocytes 11/04/2020 0.74  Eosinophils 11/04/2020 0.15   Basophils 11/04/2020 0.07   Neutrophil % 11/04/2020 58.8   Lymphocyte % 11/04/2020 31.2   Monocyte % 11/04/2020 7.5   Eosinophil % 11/04/2020 1.5   Basophil% 11/04/2020 0.7   Immature Granulocyte % 11/04/2020 0.3   Immature Granulocyte Cou* 11/04/2020 0.03   Glucose 11/04/2020 91   Sodium 11/04/2020 144   Potassium 11/04/2020 3.6   Chloride 11/04/2020 104   Carbon Dioxide (CO2) 11/04/2020 29.6   Urea Nitrogen (BUN) 11/04/2020 11   Creatinine 11/04/2020 0.9   Glomerular Filtration Ra* 11/04/2020 74   Calcium 11/04/2020 9.5   AST 11/04/2020 23   ALT 11/04/2020 21    Alk Phos (alkaline Phosp* 11/04/2020 96   Albumin 11/04/2020 4.6   Bilirubin, Total 11/04/2020 0.6   Protein, Total 11/04/2020 6.9   A/G Ratio 11/04/2020 2.0   Cholesterol, Total 11/04/2020 132   Triglyceride 11/04/2020 69   HDL (High Density Lipopr* 11/04/2020 45.3   LDL Calculated 11/04/2020 73   VLDL Cholesterol 11/04/2020 14   Cholesterol/HDL Ratio 11/04/2020 2.9   Color 11/04/2020 Yellow   Clarity 11/04/2020 Clear   Specific Gravity 11/04/2020 <=1.005   pH, Urine 11/04/2020 7.0   Protein, Urinalysis 11/04/2020 Negative   Glucose, Urinalysis 11/04/2020 Negative   Ketones, Urinalysis 11/04/2020 Negative   Blood, Urinalysis 11/04/2020 Negative   Nitrite, Urinalysis 11/04/2020 Negative   Leukocyte Esterase, Urin* 11/04/2020 Small (!)   White Blood Cells, Urina* 11/04/2020 0-3   Red Blood Cells, Urinaly* 11/04/2020 None Seen   Bacteria, Urinalysis 11/04/2020 Rare (!)   Squamous Epithelial Cell* 11/04/2020 Rare   Hemoglobin A1C 11/04/2020 4.7   Average Blood Glucose (C* 11/04/2020 88   Uric Acid 11/04/2020 4.0    ASSESSMENT: Ms. Kaser is a 76 y.o. female presenting for consultation for left breast papillary lesion with atypia.   Patient was oriented again about the pathology results. Surgical alternatives were discussed with patient including excisional biopsy. Surgical technique and post operative care was discussed with patient. Risk of surgery was discussed with patient including but not limited to: wound infection, seroma, hematoma, brachial plexopathy, mondor's disease (thrombosis of small veins of breast), chronic wound pain, breast lymphedema, altered sensation to the nipple and cosmesis among others.   Patient was oriented that the goal of the surgery is diagnostic and not therapeutic. She was oriented about possible need of further surgery if carcinoma is identified. Patient and her daughter understood the plan and agreed to proceed.  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 Left breast needle guided excisional biopsy (19125) 2. CBC, CMP done 3. Avoid taking aspirin 5 days before the procedure 4. Contact us if you have any concern  Patient and her daughter verbalized understanding, all questions were answered, and were agreeable with the plan outlined above.   Herbert Pun, MD

## 2021-03-01 NOTE — Op Note (Signed)
Preoperative diagnosis: Left breast papillary lesion with atypia.  Postoperative diagnosis: Same.   Procedure: Left needle guided excisional biopsy.                   Anesthesia: GETA  Surgeon: Dr. Windell Moment  Wound Classification: Clean  Indications: Patient is a 76 y.o. female with a nonpalpable left breast mass noted on mammography with core biopsy demonstrating papillary lesion with atypia requires needle localized excisional biopsy to rule out malignancy.   Findings: 1. Specimen mammography shows marker and wire localization on specimen 2. No other palpable mass or lymph node identified.   Description of procedure: Preoperative wire localization was performed by radiology.  Localization studies were reviewed. The patient was taken to the operating room and placed supine on the operating table, and after general anesthesia the left chest and axilla were prepped and draped in the usual sterile fashion. A time-out was completed verifying correct patient, procedure, site, positioning, and implant(s) and/or special equipment prior to beginning this procedure.  By comparing the localization studies, the probable trajectory and location of the mass was visualized. A circumareolar skin incision was planned in such a way as to minimize the amount of dissection to reach the mass.  The skin incision was made. Flaps were raised and the location of the tag was confirmed with Localizer device confirmed. A 2-0 silk figure-of-eight stay suture was placed and used for retraction. Dissection was then taken down circumferentially, taking care to include the entire localizing wires and a wide margin of grossly normal tissue. The specimen and entire localizing wires were removed. The specimen was oriented and sent to radiology with the localization studies. The wound was irrigated. Hemostasis was checked. The wound was closed with interrupted sutures of 3-0 Vicryl and a subcuticular suture of Monocryl 3-0. No  attempt was made to close the dead space.    Specimen: Left Breast excisional biopsy                   Complications: None  Estimated Blood Loss: 10 mL

## 2021-03-02 ENCOUNTER — Encounter: Payer: Self-pay | Admitting: General Surgery

## 2021-03-02 LAB — HCV INTERPRETATION

## 2021-03-02 LAB — HCV AB W REFLEX TO QUANT PCR: HCV Ab: <0.1 s/co ratio (ref 0.0–0.9)

## 2021-03-02 NOTE — Anesthesia Postprocedure Evaluation (Signed)
Anesthesia Post Note  [name]  Procedure(s) Performed: Excisional BREAST BIOPSY WITH NEEDLE LOCALIZATION (bracket) (Left: Breast)  Patient location during evaluation: PACU Anesthesia Type: General Level of consciousness: awake Pain management: satisfactory to patient Vital Signs Assessment: post-procedure vital signs reviewed and stable Respiratory status: spontaneous breathing and respiratory function stable Cardiovascular status: stable Anesthetic complications: no   No notable events documented.   Last Vitals:  Vitals:   03/01/21 1414 03/01/21 1424  BP: (!) 144/86 (!) 148/67  Pulse: (!) 58 67  Resp: 18 18  Temp:  (!) 36.1 C  SpO2: 96% 95%    Last Pain:  Vitals:   03/01/21 1424  TempSrc: Temporal  PainSc: 2                  VAN STAVEREN,Topaz Raglin

## 2021-03-05 ENCOUNTER — Other Ambulatory Visit: Payer: Self-pay | Admitting: General Surgery

## 2021-03-05 ENCOUNTER — Encounter: Payer: Self-pay | Admitting: *Deleted

## 2021-03-05 DIAGNOSIS — C50912 Malignant neoplasm of unspecified site of left female breast: Secondary | ICD-10-CM

## 2021-03-05 NOTE — Progress Notes (Signed)
Notified by Dr. Peyton Najjar that  he had informed the patient of her biopsy results and need for medical oncology consult.  He told her to expect a call from me.  Called patient to establish navigation.  I have scheduled patient to see Dr. Tasia Catchings on 03/09/21 @ 11:15.  Will give educational material at that time.

## 2021-03-09 ENCOUNTER — Encounter: Payer: Self-pay | Admitting: *Deleted

## 2021-03-09 ENCOUNTER — Encounter: Payer: Self-pay | Admitting: Oncology

## 2021-03-09 ENCOUNTER — Other Ambulatory Visit: Payer: Self-pay

## 2021-03-09 ENCOUNTER — Inpatient Hospital Stay: Payer: Medicare PPO

## 2021-03-09 ENCOUNTER — Inpatient Hospital Stay: Payer: Medicare PPO | Attending: Oncology | Admitting: Oncology

## 2021-03-09 VITALS — BP 162/66 | HR 63 | Temp 96.4°F | Resp 16 | Ht 62.0 in | Wt 180.9 lb

## 2021-03-09 DIAGNOSIS — Z8349 Family history of other endocrine, nutritional and metabolic diseases: Secondary | ICD-10-CM | POA: Diagnosis not present

## 2021-03-09 DIAGNOSIS — Z8249 Family history of ischemic heart disease and other diseases of the circulatory system: Secondary | ICD-10-CM | POA: Diagnosis not present

## 2021-03-09 DIAGNOSIS — Z8261 Family history of arthritis: Secondary | ICD-10-CM | POA: Insufficient documentation

## 2021-03-09 DIAGNOSIS — C50212 Malignant neoplasm of upper-inner quadrant of left female breast: Secondary | ICD-10-CM | POA: Diagnosis not present

## 2021-03-09 DIAGNOSIS — Z79899 Other long term (current) drug therapy: Secondary | ICD-10-CM | POA: Diagnosis not present

## 2021-03-09 DIAGNOSIS — K219 Gastro-esophageal reflux disease without esophagitis: Secondary | ICD-10-CM | POA: Insufficient documentation

## 2021-03-09 DIAGNOSIS — C50912 Malignant neoplasm of unspecified site of left female breast: Secondary | ICD-10-CM

## 2021-03-09 DIAGNOSIS — Z9049 Acquired absence of other specified parts of digestive tract: Secondary | ICD-10-CM | POA: Diagnosis not present

## 2021-03-09 DIAGNOSIS — Z881 Allergy status to other antibiotic agents status: Secondary | ICD-10-CM | POA: Diagnosis not present

## 2021-03-09 DIAGNOSIS — I6523 Occlusion and stenosis of bilateral carotid arteries: Secondary | ICD-10-CM | POA: Insufficient documentation

## 2021-03-09 DIAGNOSIS — M109 Gout, unspecified: Secondary | ICD-10-CM | POA: Diagnosis not present

## 2021-03-09 DIAGNOSIS — Z880 Allergy status to penicillin: Secondary | ICD-10-CM | POA: Diagnosis not present

## 2021-03-09 DIAGNOSIS — Z170 Estrogen receptor positive status [ER+]: Secondary | ICD-10-CM | POA: Insufficient documentation

## 2021-03-09 DIAGNOSIS — I428 Other cardiomyopathies: Secondary | ICD-10-CM | POA: Diagnosis not present

## 2021-03-09 DIAGNOSIS — Z87891 Personal history of nicotine dependence: Secondary | ICD-10-CM | POA: Insufficient documentation

## 2021-03-09 DIAGNOSIS — I10 Essential (primary) hypertension: Secondary | ICD-10-CM | POA: Diagnosis not present

## 2021-03-09 DIAGNOSIS — F17200 Nicotine dependence, unspecified, uncomplicated: Secondary | ICD-10-CM | POA: Insufficient documentation

## 2021-03-09 DIAGNOSIS — K2970 Gastritis, unspecified, without bleeding: Secondary | ICD-10-CM | POA: Insufficient documentation

## 2021-03-09 DIAGNOSIS — I2510 Atherosclerotic heart disease of native coronary artery without angina pectoris: Secondary | ICD-10-CM | POA: Insufficient documentation

## 2021-03-09 DIAGNOSIS — Z833 Family history of diabetes mellitus: Secondary | ICD-10-CM | POA: Insufficient documentation

## 2021-03-09 DIAGNOSIS — Z7189 Other specified counseling: Secondary | ICD-10-CM | POA: Insufficient documentation

## 2021-03-09 LAB — COMPREHENSIVE METABOLIC PANEL WITH GFR
ALT: 16 U/L (ref 0–44)
AST: 18 U/L (ref 15–41)
Albumin: 4.4 g/dL (ref 3.5–5.0)
Alkaline Phosphatase: 82 U/L (ref 38–126)
Anion gap: 9 (ref 5–15)
BUN: 11 mg/dL (ref 8–23)
CO2: 26 mmol/L (ref 22–32)
Calcium: 9.1 mg/dL (ref 8.9–10.3)
Chloride: 102 mmol/L (ref 98–111)
Creatinine, Ser: 0.82 mg/dL (ref 0.44–1.00)
GFR, Estimated: >60 mL/min (ref >60)
Glucose, Bld: 102 mg/dL — ABNORMAL HIGH (ref 70–99)
Potassium: 3.7 mmol/L (ref 3.5–5.1)
Sodium: 137 mmol/L (ref 135–145)
Total Bilirubin: 0.5 mg/dL (ref 0.3–1.2)
Total Protein: 7.6 g/dL (ref 6.5–8.1)

## 2021-03-09 LAB — CBC WITH DIFFERENTIAL/PLATELET
Abs Immature Granulocytes: 0.05 K/uL (ref 0.00–0.07)
Basophils Absolute: 0.1 K/uL (ref 0.0–0.1)
Basophils Relative: 1 %
Eosinophils Absolute: 0.2 K/uL (ref 0.0–0.5)
Eosinophils Relative: 2 %
HCT: 40.9 % (ref 36.0–46.0)
Hemoglobin: 12.9 g/dL (ref 12.0–15.0)
Immature Granulocytes: 1 %
Lymphocytes Relative: 24 %
Lymphs Abs: 2.4 K/uL (ref 0.7–4.0)
MCH: 27.7 pg (ref 26.0–34.0)
MCHC: 31.5 g/dL (ref 30.0–36.0)
MCV: 88 fL (ref 80.0–100.0)
Monocytes Absolute: 0.9 K/uL (ref 0.1–1.0)
Monocytes Relative: 9 %
Neutro Abs: 6.3 K/uL (ref 1.7–7.7)
Neutrophils Relative %: 63 %
Platelets: 257 K/uL (ref 150–400)
RBC: 4.65 MIL/uL (ref 3.87–5.11)
RDW: 13.1 % (ref 11.5–15.5)
Smear Review: NORMAL
WBC: 9.9 K/uL (ref 4.0–10.5)
nRBC: 0 % (ref 0.0–0.2)

## 2021-03-09 NOTE — Progress Notes (Signed)
Hematology/Oncology Consult note [phone] [phone]         Patient Care Team: Marinda Elk, MD as PCP - General (Physician Assistant)  REFERRING PROVIDER: Herbert Pun, *  CHIEF COMPLAINTS/REASON FOR VISIT:  Evaluation of left breast cancer  HISTORY OF PRESENTING ILLNESS:   Brenda Combs is a  76 y.o.  female with [place] listed below was seen in consultation at the request of  Herbert Pun, *  for evaluation of left breast cancer  12/18/2020, screening mammogram bilaterally showed possible mass warrants further evaluation.  Right breast has no findings suspicious for malignancy. 12/28/2020, left breast diagnostic mammogram showed 11:00 1.4 cm suspicious mass.  Associated calcifications in linear distribution spanning 4 cm.  There is no evidence of left axillary lymphadenopathy. 01/07/2021, patient underwent ultrasound-guided left breast 11:00 mass biopsy   Pathology showed detached fragment of papillary lesion with sclerosis and atypia. and stereotactic biopsy of left upper inner quadrant calcification.  The pathology showed papillary lesion with atypia.  Calcifications are present.  No definitive evidence of invasive carcinoma in these biopsies.  03/01/2021, patient underwent left breast lumpectomy by Dr. Peyton Najjar. 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 Margins are negative for both invasive carcinoma and DCIS.  pT23m pNx ER positive [ER expression not visualizing the invasive component due to small size and suboptimal tissue sections.]-Stain is being repeated. PR positive  HER2 pending.  Patient denies any family history of breast cancer. Menarche [age]years old Patient has 3 children.  Age at first birth, 89years of age, Patient took OCP pills in the past. Menopause in 57s Denies any  HRT.  Denies any previous breast biopsy.  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   Review of Systems  Constitutional:  Negative for appetite change, chills, fatigue and fever.  HENT:   Negative for hearing loss and voice change.   Eyes:  Negative for eye problems.  Respiratory:  Negative for chest tightness and cough.   Cardiovascular:  Negative for chest pain.  Gastrointestinal:  Negative for abdominal distention, abdominal pain and blood in stool.  Endocrine: Negative for hot flashes.  Genitourinary:  Negative for difficulty urinating and frequency.   Musculoskeletal:  Negative for arthralgias.  Skin:  Negative for itching and rash.  Neurological:  Negative for extremity weakness.  Hematological:  Negative for adenopathy.  Psychiatric/Behavioral:  Negative for confusion.    MEDICAL HISTORY:  Past Medical History:  Diagnosis Date   Arthritis    Bilateral carotid artery stenosis    CAD (coronary artery disease)    GERD (gastroesophageal reflux disease)    Gout    Heart murmur    History of abnormal Pap smear    Hyperlipidemia    Hypertension    LBBB (left bundle branch block) 01/20/2021   a.) noted on preoperative ECG   N[place]ischemic cardiomyopathy[place] [place]   a.) TTE 02/15/2021: ED 334-74% mod LV systolic dysfunction with LVH; mild panvalvular regurgitation. b.) Lexiscan 02/15/2021: EF 21%; global LV systolic dysfunction; no significant ischemia.   SOB (shortness of breath) on exertion     SURGICAL HISTORY: Past Surgical History:  Procedure Laterality Date   APPENDECTOMY     BREAST BIOPSY Left 01/07/2021   stereo bx calcs, coil marker, path pending   BREAST BIOPSY Left 01/07/2021  [place] bx of calcs, heart marker, path pending   BREAST BIOPSY Left 03/01/2021   Procedure: Excisional BREAST BIOPSY WITH NEEDLE  LOCALIZATION (bracket);  Surgeon: Herbert Pun, MD;  Location: [facility] ORS;  Service: General;  Laterality: Left;   COLONOSCOPY WITH PROPOFOL N/A 11/23/2015   Procedure: COLONOSCOPY WITH PROPOFOL;  Surgeon: Manya Silvas, MD;  Location: [facility] ENDOSCOPY;  Service: Endoscopy;  Laterality: N/A;   COLONOSCOPY WITH PROPOFOL N/A 02/07/2017   Procedure: COLONOSCOPY WITH PROPOFOL;  Surgeon: [name], MD;  Location: [facility] ENDOSCOPY;  Service: Endoscopy;  Laterality: N/A;   ESOPHAGOGASTRODUODENOSCOPY (EGD) WITH PROPOFOL N/A 11/23/2015   Procedure: ESOPHAGOGASTRODUODENOSCOPY (EGD) WITH PROPOFOL;  Surgeon: Manya Silvas, MD;  Location: [facility] ENDOSCOPY;  Service: Endoscopy;  Laterality: N/A;   TUBAL LIGATION     WISDOM TOOTH EXTRACTION     x 2    SOCIAL HISTORY: Social History   Socioeconomic History   Marital status: Widowed    Spouse name: Not on file   Number of children: Not on file   Years of education: Not on file   Highest education level: Not on file  Occupational History   Not on file  Tobacco Use   Smoking status: Former    Packs/day: 0.75    Years: 40.00    Pack years: 30.00    Types: Cigarettes    Quit date: 02/28/2021    Years since quitting: 0.0   Smokeless tobacco: Never  Vaping Use   Vaping Use: Never used  Substance and Sexual Activity   Alcohol use: No   Drug use: No   Sexual activity: Not Currently    Birth control/protection: Post-menopausal    Comment: widow since 2000  Other Topics Concern   Not on file  Social History Narrative   Lives with daughter   Social Determinants of Health   Financial Resource Strain: Not on file  Food Insecurity: Not on file  Transportation Needs: Not on file  Physical Activity: Not on file  Stress: Not on file  Social Connections: Not on file  Intimate Partner Violence: Not on file    FAMILY HISTORY: Family History  Problem Relation Age of Onset   Arthritis Mother    Hyperlipidemia Mother     Hypertension Mother    Hyperlipidemia Sister    Hypertension Sister    Hypertension Daughter    Diabetes Sister    Hyperlipidemia Sister    Hypertension Sister    Hypertension Daughter    Hypertension Daughter    Breast cancer Neg Hx     ALLERGIES:  is allergic to augmentin [amoxicillin-pot clavulanate] and zyrtec [cetirizine hcl].  MEDICATIONS:  Current Outpatient Medications  Medication Sig Dispense Refill   allopurinol (ZYLOPRIM) 100 MG tablet Take 100 mg by mouth daily.     amLODipine (NORVASC) 5 MG tablet Take 7.5 mg by mouth daily.     aspirin 81 MG tablet Take 81 mg by mouth daily.     atorvastatin (LIPITOR) 40 MG tablet Take 40 mg by mouth daily.     carvedilol (COREG) 6.25 MG tablet Take 6.25 mg by mouth 2 (two) times daily.     cyclobenzaprine (FLEXERIL) 5 MG tablet Take 5 mg by mouth 2 (two) times daily as needed for muscle spasms.     fluticasone (FLONASE) 50 MCG/ACT nasal spray Place 1 spray into both nostrils daily.     naproxen (NAPROSYN) 500 MG tablet Take 500 mg by mouth 2 (two) times daily as needed for pain.  olmesartan (BENICAR) 40 MG tablet Take 40 mg by mouth daily.     omeprazole (PRILOSEC) 40 MG capsule Take 40 mg by mouth 2 (two) times daily.     spironolactone (ALDACTONE) 25 MG tablet Take 25 mg by mouth daily.     No current facility-administered medications for this visit.     PHYSICAL EXAMINATION: ECOG PERFORMANCE STATUS: 0 - Asymptomatic Vitals:   03/09/21 1136  BP: (!) 162/66  Pulse: 63  Resp: 16  Temp: (!) 96.4 F (35.8 C)  SpO2: 98%   Filed Weights   03/09/21 1136  Weight: 180 lb 14.4 oz (82.1 kg)    Physical Exam Constitutional:      General: She is not in acute distress. HENT:     Head: Normocephalic and atraumatic.  Eyes:     General: No scleral icterus. Cardiovascular:     Rate and Rhythm: Normal rate and regular rhythm.     Heart sounds: Normal heart sounds.  Pulmonary:     Effort: Pulmonary effort is normal. No  respiratory distress.     Breath sounds: No wheezing.  Abdominal:     General: Bowel sounds are normal. There is no distension.     Palpations: Abdomen is soft.  Musculoskeletal:        General: No deformity. Normal range of motion.     Cervical back: Normal range of motion and neck supple.  Skin:    General: Skin is warm and dry.     Findings: No erythema or rash.  Neurological:     Mental Status: She is alert and oriented to person, place, and time. Mental status is at baseline.     Cranial Nerves: No cranial nerve deficit.     Coordination: Coordination normal.  Psychiatric:        Mood and Affect: Mood normal.    LABORATORY DATA:  I have reviewed the data as listed Lab Results  Component Value Date   WBC 9.9 03/09/2021   HGB 12.9 03/09/2021   HCT 40.9 03/09/2021   MCV 88.0 03/09/2021   PLT 257 03/09/2021   Recent Labs    03/09/21 1218  NA 137  K 3.7  CL 102  CO2 26  GLUCOSE 102*  BUN 11  CREATININE 0.82  CALCIUM 9.1  GFRNONAA >60  PROT 7.6  ALBUMIN 4.4  AST 18  ALT 16  ALKPHOS 82  BILITOT 0.5   Iron/TIBC/Ferritin/ %Sat No results found for: IRON, TIBC, FERRITIN, IRONPCTSAT    RADIOGRAPHIC STUDIES: I have personally reviewed the radiological images as listed and agreed with the findings in the report. MM Breast Surgical Specimen  Result Date: 03/01/2021 CLINICAL DATA:  Specimen radiograph status post left breast lumpectomy. EXAM: SPECIMEN RADIOGRAPH OF THE LEFT BREAST COMPARISON:  Previous exam(s). FINDINGS: Status post excision of the left breast. The 2 wire tips and the coil and the heart shaped biopsy marker clips are present within the specimen. No call back to the OR was requested. IMPRESSION: Specimen radiograph of the left breast. Electronically Signed   By: Ammie Ferrier M.D.   On: 03/01/2021 13:29  MM LT PLC BREAST LOC DEV   1ST LESION  INC MAMMO GUIDE  Result Date: 03/01/2021 CLINICAL DATA:  Wire localization of the left breast prior to  lumpectomy. EXAM: NEEDLE LOCALIZATION OF THE LEFT BREAST WITH MAMMO GUIDANCE COMPARISON:  Previous exams. PROCEDURE: Patient presents for needle localization prior to left breast lumpectomy. I met with the patient and we discussed the procedure  of needle localization including benefits and alternatives. We discussed the high likelihood of a successful procedure. We discussed the risks of the procedure, including infection, bleeding, tissue injury, and further surgery. Informed, written consent was given. The usual time-out protocol was performed immediately prior to the procedure. Using mammographic guidance, sterile technique, 1% lidocaine and a 5 cm modified Kopans needle, the anterior coil shaped biopsy marking clip was localized using superior approach. Using mammographic guidance, sterile technique, 1% lidocaine and a 7 cm modified Kopan's needle, the posterior heart shaped biopsy marking clip was localized using a superior approach. The images were marked for Dr. Windell Moment. IMPRESSION: Bracketed localization left breast. No apparent complications. Electronically Signed   By: Ammie Ferrier M.D.   On: 03/01/2021 08:43  MM LT PLC BREAST LOC DEV   EA ADD LESION  INC MAMMO GUIDE  Result Date: 03/01/2021 CLINICAL DATA:  Wire localization of the left breast prior to lumpectomy. EXAM: NEEDLE LOCALIZATION OF THE LEFT BREAST WITH MAMMO GUIDANCE COMPARISON:  Previous exams. PROCEDURE: Patient presents for needle localization prior to left breast lumpectomy. I met with the patient and we discussed the procedure of needle localization including benefits and alternatives. We discussed the high likelihood of a successful procedure. We discussed the risks of the procedure, including infection, bleeding, tissue injury, and further surgery. Informed, written consent was given. The usual time-out protocol was performed immediately prior to the procedure. Using mammographic guidance, sterile technique, 1% lidocaine and  a 5 cm modified Kopans needle, the anterior coil shaped biopsy marking clip was localized using superior approach. Using mammographic guidance, sterile technique, 1% lidocaine and a 7 cm modified Kopan's needle, the posterior heart shaped biopsy marking clip was localized using a superior approach. The images were marked for Dr. Windell Moment. IMPRESSION: Bracketed localization left breast. No apparent complications. Electronically Signed   By: Ammie Ferrier M.D.   On: 03/01/2021 08:43     ASSESSMENT & PLAN:  1. Malignant neoplasm of upper-inner quadrant of left breast in female, estrogen receptor positive ([place])   2. 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 Pathology reports were reviewed and discussed with patient in details.  Images were reviewed independently by me. If HER2 is negative, given that she is above 73years of age, hormone receptor positive, I think sentinel lymph node biopsy can be safely omitted.recommend patient to proceed with adjuvant radiation followed by adjuvant endocrine therapy with aromatase inhibitor.  If HER2 is positive, given that micro invasive component is only 0.5 mm,low likelihood of recurrence, and her history of cardiomyopathy, would not offer HER2 therapy/chemotherapy.  Follow-up TBD.  Check CBC, CMP today. Orders Placed This Encounter  Procedures   Comprehensive metabolic panel    Standing Status:   Future    Number of Occurrences:   1    Standing Expiration Date:   03/09/2022   CBC with Differential/Platelet    Standing Status:   Future    Number of Occurrences:   1     Standing Expiration Date:   03/09/2022    All questions were answered. The patient knows to call the clinic with any problems questions or concerns.  cc Herbert Pun, *    Return of visit: TBD Thank you for this kind referral and the opportunity to participate in the care of this patient. A copy of today's note is routed to referring provider   Earlie Server, MD, PhD [facility] Hematology Oncology 03/09/2021

## 2021-03-09 NOTE — Progress Notes (Signed)
Pt and daughter are in today for new pt visit for left breast cancer diagnosis.  Pt states she is doing well and states she quit smoking on 02/28/21.

## 2021-03-10 NOTE — Progress Notes (Signed)
Met with patient and her daughter during her initial medical oncology consult with Dr. Tasia Catchings.  Patient has a microinvasive left breast cancer.  Her2 is pending.  Patient will be referred to radiation therapy once Her2 has been determined.  Gave patient breast cancer educational literature, "My Breast Cancer Treatment Handbook" by Josephine Igo, RN.   She was encouraged to call if she has any questions or needs.

## 2021-03-19 ENCOUNTER — Encounter: Payer: Self-pay | Admitting: *Deleted

## 2021-03-24 ENCOUNTER — Encounter: Payer: Self-pay | Admitting: *Deleted

## 2021-03-24 NOTE — Progress Notes (Signed)
Received message from Dr. Tasia Catchings that Dr. Dessie Coma from pathology states that the Her2 on the patient's specimen is being repeated and is still not available, and would I notify the patient.  Patient informed of the delay and that we will let her know as soon as those results are available to [place].

## 2021-03-25 LAB — SURGICAL PATHOLOGY

## 2021-03-31 ENCOUNTER — Inpatient Hospital Stay (HOSPITAL_BASED_OUTPATIENT_CLINIC_OR_DEPARTMENT_OTHER): Payer: Medicare PPO | Admitting: Hospice and Palliative Medicine

## 2021-03-31 ENCOUNTER — Other Ambulatory Visit: Payer: Self-pay

## 2021-03-31 ENCOUNTER — Encounter: Payer: Self-pay | Admitting: *Deleted

## 2021-03-31 DIAGNOSIS — Z170 Estrogen receptor positive status [ER+]: Secondary | ICD-10-CM | POA: Insufficient documentation

## 2021-03-31 DIAGNOSIS — C50212 Malignant neoplasm of upper-inner quadrant of left female breast: Secondary | ICD-10-CM

## 2021-03-31 NOTE — Progress Notes (Signed)
Multidisciplinary Oncology Council Documentation ? ?Brenda Combs was presented by our [facility] on 03/31/2021, which included representatives from:  ?Palliative Care ?Dietitian  ?Physical/Occupational Therapist ?Nurse Navigator ?Genetics ?Speech Therapist ?Social work ?Survivorship RN ?Financial Navigator ?Research RN ? ? ?Meghanne currently presents with history of breast cancer ? ?We reviewed previous medical and familial history, history of present illness, and recent lab results along with all available histopathologic and imaging studies. The [place] considered available treatment options and made the following recommendations/referrals: ? ?Rehab screening ? ?The MOC is a meeting of clinicians from various specialty areas who evaluate and discuss patients for whom a multidisciplinary approach is being considered. Final determinations in the plan of care are those of the provider(s).  ? ?Today's extended care, comprehensive team conference, Lurene was not present for the discussion and was not examined.  ? ?

## 2021-04-01 ENCOUNTER — Ambulatory Visit: Payer: Medicare PPO | Admitting: Radiation Oncology

## 2021-04-01 ENCOUNTER — Ambulatory Visit
Admission: RE | Admit: 2021-04-01 | Discharge: 2021-04-01 | Disposition: A | Discharge status: Home / Self Care | Payer: Medicare PPO | Source: Ambulatory Visit | Attending: Radiation Oncology | Admitting: Radiation Oncology

## 2021-04-01 ENCOUNTER — Encounter: Payer: Self-pay | Admitting: Radiation Oncology

## 2021-04-01 VITALS — BP 180/83 | HR 70 | Temp 97.2°F | Resp 18 | Ht 62.0 in | Wt 183.2 lb

## 2021-04-01 DIAGNOSIS — M129 Arthropathy, unspecified: Secondary | ICD-10-CM | POA: Insufficient documentation

## 2021-04-01 DIAGNOSIS — I2510 Atherosclerotic heart disease of native coronary artery without angina pectoris: Secondary | ICD-10-CM | POA: Diagnosis not present

## 2021-04-01 DIAGNOSIS — I10 Essential (primary) hypertension: Secondary | ICD-10-CM | POA: Diagnosis not present

## 2021-04-01 DIAGNOSIS — K219 Gastro-esophageal reflux disease without esophagitis: Secondary | ICD-10-CM | POA: Diagnosis not present

## 2021-04-01 DIAGNOSIS — C50212 Malignant neoplasm of upper-inner quadrant of left female breast: Secondary | ICD-10-CM | POA: Insufficient documentation

## 2021-04-01 DIAGNOSIS — Z87891 Personal history of nicotine dependence: Secondary | ICD-10-CM | POA: Insufficient documentation

## 2021-04-01 DIAGNOSIS — Z79899 Other long term (current) drug therapy: Secondary | ICD-10-CM | POA: Insufficient documentation

## 2021-04-01 DIAGNOSIS — Z7982 Long term (current) use of aspirin: Secondary | ICD-10-CM | POA: Insufficient documentation

## 2021-04-01 DIAGNOSIS — I447 Left bundle-branch block, unspecified: Secondary | ICD-10-CM | POA: Diagnosis not present

## 2021-04-01 DIAGNOSIS — R011 Cardiac murmur, unspecified: Secondary | ICD-10-CM | POA: Insufficient documentation

## 2021-04-01 DIAGNOSIS — Z170 Estrogen receptor positive status [ER+]: Secondary | ICD-10-CM | POA: Insufficient documentation

## 2021-04-01 DIAGNOSIS — E785 Hyperlipidemia, unspecified: Secondary | ICD-10-CM | POA: Insufficient documentation

## 2021-04-01 NOTE — Consult Note (Signed)
NEW PATIENT EVALUATION  Name: Brenda Combs  MRN: [id number]  Date:   04/01/2021     DOB: 1946/01/26   This 76 y.o. female patient presents to the clinic for initial evaluation of stage Ia (T1 mi N0 M0.  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   PREVIOUS INVESTIGATIONS:  Mammogram and ultrasound reviewed Clinical notes reviewed Pathology reports reviewed  HPI: Patient is a 76 year old female presents with an abnormal mammogram of the left breast showing a lesion at the 11 o'clock position 1.4 cm in dimensions with suspicious calcifications in the linear distribution spanning 4 cm.  Targeted biopsy was positive for papillary lesion with atypia.  On ultrasound there was no evidence of adenopathy in the left axilla.  She went on to have a wide local excision showing microinvasive carcinoma measuring 5.5 mm.  There are multiple foci of microinvasive cancer.  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  All margins were negative with distance from closest margin of microinvasion 10 mm.  Margins for DCIS were also clear at 10 mm.  No regional lymph nodes were submitted.  Tumor was ER/PR positive and HER2/neu not overexpressed.  She is now referred to radiation collagen for consideration of treatment.  She is doing well having some slight breast tenderness otherwise no complaints  PLANNED TREATMENT REGIMEN: Left whole breast radiation  PAST MEDICAL HISTORY:  has a past medical history of Arthritis, Bilateral carotid artery stenosis, CAD (coronary artery disease), GERD (gastroesophageal reflux disease), Gout, Heart murmur, History of abnormal Pap smear,  Hyperlipidemia, Hypertension, LBBB (left bundle branch block) (01/20/2021), NICM (nonischemic cardiomyopathy) ([place]) (02/15/2021), and SOB (shortness of breath) on exertion.    PAST SURGICAL HISTORY:  Past Surgical History:  Procedure Laterality Date   APPENDECTOMY     BREAST BIOPSY Left 01/07/2021   stereo bx calcs, coil marker, path pending   BREAST BIOPSY Left 01/07/2021   [place] bx of calcs, heart marker, path pending   BREAST BIOPSY Left 03/01/2021   Procedure: Excisional BREAST BIOPSY WITH NEEDLE LOCALIZATION (bracket);  Surgeon: Herbert Pun, MD;  Location: [facility] ORS;  Service: General;  Laterality: Left;   COLONOSCOPY WITH PROPOFOL N/A 11/23/2015   Procedure: COLONOSCOPY WITH PROPOFOL;  Surgeon: Manya Silvas, MD;  Location: [facility] ENDOSCOPY;  Service: Endoscopy;  Laterality: N/A;   COLONOSCOPY WITH PROPOFOL N/A 02/07/2017   Procedure: COLONOSCOPY WITH PROPOFOL;  Surgeon: [name], MD;  Location: [facility] ENDOSCOPY;  Service: Endoscopy;  Laterality: N/A;   ESOPHAGOGASTRODUODENOSCOPY (EGD) WITH PROPOFOL N/A 11/23/2015   Procedure: ESOPHAGOGASTRODUODENOSCOPY (EGD) WITH PROPOFOL;  Surgeon: Manya Silvas, MD;  Location: [facility] ENDOSCOPY;  Service: Endoscopy;  Laterality: N/A;   TUBAL LIGATION     WISDOM TOOTH EXTRACTION     x 2    FAMILY HISTORY: family history includes Arthritis in her mother; Diabetes in her sister; Hyperlipidemia in her mother, sister, and sister; Hypertension in her daughter, daughter, daughter, mother, sister, and sister.  SOCIAL HISTORY:  reports that she quit smoking about 4 weeks ago. Her smoking use included cigarettes. She has a 30.00 pack-year smoking history. She has never used smokeless tobacco. She reports that she does not drink alcohol and does  not use drugs.  ALLERGIES: Augmentin [amoxicillin-pot clavulanate] and Zyrtec [cetirizine hcl]  MEDICATIONS:  Current Outpatient Medications  Medication Sig Dispense Refill   allopurinol (ZYLOPRIM)  100 MG tablet Take 100 mg by mouth daily.     amLODipine (NORVASC) 5 MG tablet Take 7.5 mg by mouth daily.     aspirin 81 MG tablet Take 81 mg by mouth daily.     atorvastatin (LIPITOR) 40 MG tablet Take 40 mg by mouth daily.     carvedilol (COREG) 6.25 MG tablet Take 6.25 mg by mouth 2 (two) times daily.     cyclobenzaprine (FLEXERIL) 5 MG tablet Take 5 mg by mouth 2 (two) times daily as needed for muscle spasms.     fluticasone (FLONASE) 50 MCG/ACT nasal spray Place 1 spray into both nostrils daily.     naproxen (NAPROSYN) 500 MG tablet Take 500 mg by mouth 2 (two) times daily as needed for pain.     olmesartan (BENICAR) 40 MG tablet Take 40 mg by mouth daily.     omeprazole (PRILOSEC) 40 MG capsule Take 40 mg by mouth 2 (two) times daily.     spironolactone (ALDACTONE) 25 MG tablet Take 25 mg by mouth daily.     No current facility-administered medications for this encounter.    ECOG PERFORMANCE STATUS:  0 - Asymptomatic  REVIEW OF SYSTEMS: Patient denies any weight loss, fatigue, weakness, fever, chills or night sweats. Patient denies any loss of vision, blurred vision. Patient denies any ringing  of the ears or hearing loss. No irregular heartbeat. Patient denies heart murmur or history of fainting. Patient denies any chest pain or pain radiating to her upper extremities. Patient denies any shortness of breath, difficulty breathing at night, cough or hemoptysis. Patient denies any swelling in the lower legs. Patient denies any nausea vomiting, vomiting of blood, or coffee ground material in the vomitus. Patient denies any stomach pain. Patient states has had normal bowel movements no significant constipation or diarrhea. Patient denies any dysuria, hematuria or significant nocturia. Patient denies any problems walking, swelling in the joints or loss of balance. Patient denies any skin changes, loss of hair or loss of weight. Patient denies any excessive worrying or anxiety or significant  depression. Patient denies any problems with insomnia. Patient denies excessive thirst, polyuria, polydipsia. Patient denies any swollen glands, patient denies easy bruising or easy bleeding. Patient denies any recent infections, allergies or URI. Patient "s visual fields have not changed significantly in recent time.   PHYSICAL EXAM: BP (!) 180/83 (BP Location: Left Arm, Patient Position: Sitting)    Pulse 70    Temp (!) 97.2 F (36.2 C) (Tympanic)    Resp 18    Ht 5' 2"  (1.575 m)    Wt 183 lb 3.2 oz (83.1 kg)    BMI 33.51 kg/m  Left breast is wide local excision scar which is healed well.  There is a seroma present in the lumpectomy site.  No other dominant masses noted in either breast no axillary or supraclavicular adenopathy is appreciated.  Well-developed well-nourished patient in NAD. HEENT reveals PERLA, EOMI, discs not visualized.  Oral cavity is clear. No oral mucosal lesions are identified. Neck is clear without evidence of cervical or supraclavicular adenopathy. Lungs are clear to A&P. Cardiac examination is essentially unremarkable with regular rate and rhythm without murmur rub or thrill. Abdomen is benign with no organomegaly or masses noted. Motor sensory and DTR levels are equal and symmetric in the upper and  lower extremities. Cranial nerves II through XII are grossly intact. Proprioception is intact. No peripheral adenopathy or edema is identified. No motor or sensory levels are noted. Crude visual fields are within normal range.  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  She has a very large pendulous breast making hypofractionated course of treatment difficult.  We will plan on  delivering 5040 cGy in 28 fractions.  Boost would boost her scar another 1000 cGy using electron beam.  Risks and benefits of treatment including skin reaction fatigue alteration of blood counts possible inclusion of superficial lung all were reviewed in detail.  Do not feel that is necessary need for axillary node sampling based on the extremely small nature of her tumor and ER positive findings.  Patient comprehends my recommendations well  Personally set up and ordered CT simulation for early next week.  Patient also will benefit from antiestrogen therapy after completion of radiation.  I would like to take this opportunity to thank you for allowing me to participate in the care of your patient.Noreene Filbert, MD

## 2021-04-02 ENCOUNTER — Telehealth: Payer: Self-pay | Admitting: *Deleted

## 2021-04-02 NOTE — Telephone Encounter (Signed)
RN contacted patient to schedule her OT screening apt with [facility]. (Per recommendations/referral from [facility]). ? ?Patient educated on the purpose of the apt. She is agreeable and appreciative of apt with Gwenette Greet.  Patient scheduled 04/21/21 at 9 am. Her radiation treatment is scheduled at 10 am. She thanked me for calling her with this apt. ?

## 2021-04-05 ENCOUNTER — Ambulatory Visit
Admission: RE | Admit: 2021-04-05 | Discharge: 2021-04-05 | Disposition: A | Discharge status: Home / Self Care | Payer: Medicare PPO | Source: Ambulatory Visit | Attending: Radiation Oncology | Admitting: Radiation Oncology

## 2021-04-05 DIAGNOSIS — Z510 Encounter for antineoplastic radiation therapy: Secondary | ICD-10-CM | POA: Diagnosis present

## 2021-04-05 DIAGNOSIS — C50212 Malignant neoplasm of upper-inner quadrant of left female breast: Secondary | ICD-10-CM | POA: Insufficient documentation

## 2021-04-06 DIAGNOSIS — Z510 Encounter for antineoplastic radiation therapy: Secondary | ICD-10-CM | POA: Diagnosis not present

## 2021-04-08 ENCOUNTER — Other Ambulatory Visit: Payer: Self-pay | Admitting: *Deleted

## 2021-04-08 DIAGNOSIS — Z170 Estrogen receptor positive status [ER+]: Secondary | ICD-10-CM

## 2021-04-12 ENCOUNTER — Ambulatory Visit: Admission: RE | Admit: 2021-04-12 | Payer: Medicare PPO | Source: Ambulatory Visit

## 2021-04-12 DIAGNOSIS — Z510 Encounter for antineoplastic radiation therapy: Secondary | ICD-10-CM | POA: Diagnosis not present

## 2021-04-13 ENCOUNTER — Ambulatory Visit
Admission: RE | Admit: 2021-04-13 | Discharge: 2021-04-13 | Disposition: A | Discharge status: Home / Self Care | Payer: Medicare PPO | Source: Ambulatory Visit | Attending: Radiation Oncology | Admitting: Radiation Oncology

## 2021-04-13 DIAGNOSIS — Z510 Encounter for antineoplastic radiation therapy: Secondary | ICD-10-CM | POA: Diagnosis not present

## 2021-04-14 ENCOUNTER — Ambulatory Visit
Admission: RE | Admit: 2021-04-14 | Discharge: 2021-04-14 | Disposition: A | Discharge status: Home / Self Care | Payer: Medicare PPO | Source: Ambulatory Visit | Attending: Radiation Oncology | Admitting: Radiation Oncology

## 2021-04-14 DIAGNOSIS — Z510 Encounter for antineoplastic radiation therapy: Secondary | ICD-10-CM | POA: Diagnosis not present

## 2021-04-15 ENCOUNTER — Ambulatory Visit
Admission: RE | Admit: 2021-04-15 | Discharge: 2021-04-15 | Disposition: A | Discharge status: Home / Self Care | Payer: Medicare PPO | Source: Ambulatory Visit | Attending: Radiation Oncology | Admitting: Radiation Oncology

## 2021-04-15 DIAGNOSIS — Z510 Encounter for antineoplastic radiation therapy: Secondary | ICD-10-CM | POA: Diagnosis not present

## 2021-04-16 ENCOUNTER — Ambulatory Visit
Admission: RE | Admit: 2021-04-16 | Discharge: 2021-04-16 | Disposition: A | Discharge status: Home / Self Care | Payer: Medicare PPO | Source: Ambulatory Visit | Attending: Radiation Oncology | Admitting: Radiation Oncology

## 2021-04-16 DIAGNOSIS — Z510 Encounter for antineoplastic radiation therapy: Secondary | ICD-10-CM | POA: Diagnosis not present

## 2021-04-19 ENCOUNTER — Ambulatory Visit
Admission: RE | Admit: 2021-04-19 | Discharge: 2021-04-19 | Disposition: A | Discharge status: Home / Self Care | Payer: Medicare PPO | Source: Ambulatory Visit | Attending: Radiation Oncology | Admitting: Radiation Oncology

## 2021-04-19 DIAGNOSIS — Z510 Encounter for antineoplastic radiation therapy: Secondary | ICD-10-CM | POA: Diagnosis not present

## 2021-04-20 ENCOUNTER — Ambulatory Visit
Admission: RE | Admit: 2021-04-20 | Discharge: 2021-04-20 | Disposition: A | Discharge status: Home / Self Care | Payer: Medicare PPO | Source: Ambulatory Visit | Attending: Radiation Oncology | Admitting: Radiation Oncology

## 2021-04-20 DIAGNOSIS — Z510 Encounter for antineoplastic radiation therapy: Secondary | ICD-10-CM | POA: Diagnosis not present

## 2021-04-21 ENCOUNTER — Ambulatory Visit
Admission: RE | Admit: 2021-04-21 | Discharge: 2021-04-21 | Disposition: A | Discharge status: Home / Self Care | Payer: Medicare PPO | Source: Ambulatory Visit | Attending: Radiation Oncology | Admitting: Radiation Oncology

## 2021-04-21 ENCOUNTER — Other Ambulatory Visit: Payer: Self-pay

## 2021-04-21 ENCOUNTER — Inpatient Hospital Stay: Payer: Medicare PPO | Admitting: Occupational Therapy

## 2021-04-21 DIAGNOSIS — Z510 Encounter for antineoplastic radiation therapy: Secondary | ICD-10-CM | POA: Diagnosis not present

## 2021-04-21 DIAGNOSIS — L905 Scar conditions and fibrosis of skin: Secondary | ICD-10-CM

## 2021-04-21 NOTE — Therapy (Signed)
[place] [facility] ?[address], [address] ?[place], [place], [address] ?Phone: [phone]   Fax:  [phone] ? ?Occupational Therapy screen ? ?Patient Details  ?Name: Brenda Combs ?MRN: [id number] ?Date of Birth: 1945-09-15 ?No data recorded ? ?Encounter Date: 04/21/2021 ? ? OT End of Session - 04/21/21 0932   ? ? Visit Number 0   ? ?  ?  ? ?  ? ? ?Past Medical History:  ?Diagnosis Date  ? Arthritis   ? Bilateral carotid artery stenosis   ? CAD (coronary artery disease)   ? GERD (gastroesophageal reflux disease)   ? Gout   ? Heart murmur   ? History of abnormal Pap smear   ? Hyperlipidemia   ? Hypertension   ? LBBB (left bundle branch block) 01/20/2021  ? a.) noted on preoperative ECG  ? NICM (nonischemic cardiomyopathy) ([place]) 02/15/2021  ? a.) TTE 02/15/2021: ED 62-56%; mod LV systolic dysfunction with LVH; mild panvalvular regurgitation. b.) Lexiscan 02/15/2021: EF 21%; global LV systolic dysfunction; no significant ischemia.  ? SOB (shortness of breath) on exertion   ? ? ?Past Surgical History:  ?Procedure Laterality Date  ? APPENDECTOMY    ? BREAST BIOPSY Left 01/07/2021  ? stereo bx calcs, coil marker, path pending  ? BREAST BIOPSY Left 01/07/2021  ? [place] bx of calcs, heart marker, path pending  ? BREAST BIOPSY Left 03/01/2021  ? Procedure: Excisional BREAST BIOPSY WITH NEEDLE LOCALIZATION (bracket);  Surgeon: Herbert Pun, MD;  Location: [facility] ORS;  Service: General;  Laterality: Left;  ? COLONOSCOPY WITH PROPOFOL N/A 11/23/2015  ? Procedure: COLONOSCOPY WITH PROPOFOL;  Surgeon: Manya Silvas, MD;  Location: [facility] ENDOSCOPY;  Service: Endoscopy;  Laterality: N/A;  ? COLONOSCOPY WITH PROPOFOL N/A 02/07/2017  ? Procedure: COLONOSCOPY WITH PROPOFOL;  Surgeon: [name], MD;  Location: [facility] ENDOSCOPY;  Service: Endoscopy;  Laterality: N/A;  ? ESOPHAGOGASTRODUODENOSCOPY (EGD) WITH PROPOFOL N/A 11/23/2015  ? Procedure: ESOPHAGOGASTRODUODENOSCOPY (EGD)  WITH PROPOFOL;  Surgeon: Manya Silvas, MD;  Location: [facility] ENDOSCOPY;  Service: Endoscopy;  Laterality: N/A;  ? TUBAL LIGATION    ? WISDOM TOOTH EXTRACTION    ? x 2  ? ? ?There were no vitals filed for this visit. ? ? Subjective Assessment - 04/21/21 0930   ? ? Subjective  I am doing okay had a few sessions of radiation already, can feel little in my shoulders with holding my arms above the head.  My motion is good. My lumpectomy scar is a little hard   ? Currently in Pain? Yes   ? Pain Score 2    ? Pain Location Breast   ? Pain Orientation Left   ? Pain Descriptors / Indicators Tender   ? Pain Type Surgical pain   ? ?  ?  ? ?  ? ? ? ? ? ? LYMPHEDEMA/ONCOLOGY QUESTIONNAIRE - 04/21/21 0001   ? ?  ? Right Upper Extremity Lymphedema  ? 15 cm Proximal to Olecranon Process 43.4 cm   ? 10 cm Proximal to Olecranon Process 42.5 cm   ? Olecranon Process 28 cm   ? 15 cm Proximal to Ulnar Styloid Process 28 cm   ?  ? Left Upper Extremity Lymphedema  ? 15 cm Proximal to Olecranon Process 42.5 cm   ? 10 cm Proximal to Olecranon Process 43 cm   ? Olecranon Process 29.5 cm   ? 15 cm Proximal to Ulnar Styloid Process 27.5 cm   ? ?  ?  ? ?  ? ? ? ?  ASSESSMENT & PLAN DR Tasia Catchings 03/09/21:  ?1. Malignant neoplasm of upper-inner quadrant of left breast in female, estrogen receptor positive ([place])   ?2. Goals of care, counseling/discussion   ? Cancer Staging  ?Malignant neoplasm of upper-inner quadrant of left breast in female, estrogen receptor positive ([place]) ?Staging form: Breast, AJCC 8th Edition ?- Clinical stage from 03/09/2021: cT46m, cM0, GX, ER+, PR+, HER2: Equivocal - Signed by YEarlie Server MD on 03/09/2021 ?  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 ?Pathology reports were reviewed and discussed with patient in details.  Images were reviewed independently by me. ?If HER2 is negative, given that she is above 89years of age, hormone receptor positive, I think  sentinel lymph node biopsy can be safely omitted.recommend patient to proceed with adjuvant radiation followed by adjuvant endocrine therapy with aromatase inhibitor.  ?If HER2 is positive, given that micro invasive component is only 0.5 mm,low likelihood of recurrence, and her history of cardiomyopathy, would not offer HER2 therapy/chemotherapy. ?  ?Follow-up TBD.  Check CBC, CMP today. ? ? ? OT SCREEN 04/21/21 [facility] referral : ?Patient's left breast cancer with lumpectomy, patient denies any lymph nodes removal.  Patient started radiation 04/12/21. ?Circumference of bilateral upper extremities taken premeasurements today-no signs and symptoms of lymphedema.  Bilateral active active range of motion for shoulders within normal limits, denies pain. ?Lumpectomy scar hard and fibrotic area, pain about a 2 out of 10 with palpation.  Patient education done for scar massage and with great success.  Patient can do a little bit but if skin gets too tender with radiation to stop.  Lymphedema education was done, handout reviewed and provided.  Patient risk for lymphedema low.  Patient to follow-up with me a week after radiation if needed. ? ? ? ? ? ? ? ? ? ? ? ? ? ? ? ? ? ? ? ? ? ? ? ? ? ? ?  ?  ?  ? ? ?Visit Diagnosis: ?Scar condition and fibrosis of skin ? ? ? ?Problem List ?Patient Active Problem List  ? Diagnosis Date Noted  ? Nicotine dependence 03/09/2021  ? Gastritis 03/09/2021  ? Malignant neoplasm of upper-inner quadrant of left breast in female, estrogen receptor positive ([place] 03/09/2021  ? Goals of care, counseling/discussion 03/09/2021  ? Cardiomyopathy, nonischemic ([facility] 02/23/2021  ? Coronary artery disease involving native coronary artery of native heart 01/26/2021  ? Bilateral carotid artery stenosis 01/26/2021  ? Osteopenia 04/13/2015  ? Hypokalemia 04/13/2015  ? Allergic rhinitis 04/13/2015  ? Thyroid nodule 04/13/2015  ? Vitamin D deficiency 04/13/2015  ? GERD (gastroesophageal reflux disease) 04/13/2015  ?  Plantar fasciitis 04/13/2015  ? Gout 11/20/2013  ? Hypertension 06/29/2011  ? Hypercholesteremia 06/29/2011  ? Obesity 06/29/2011  ? Former smoker 06/29/2011  ? Heart murmur   ? Arthritis   ? ? ?DRosalyn Gess OTR/L,CLT ?04/21/2021, 9:33 AM ? ?[facility] [place] ?[address] ?[place] [address]?Phone: [phone]  Fax:  [phone]? ?Name: Brenda Combs?MRN: [id number]?Date of Birth: [birth year]? ?

## 2021-04-22 ENCOUNTER — Ambulatory Visit
Admission: RE | Admit: 2021-04-22 | Discharge: 2021-04-22 | Disposition: A | Discharge status: Home / Self Care | Payer: Medicare PPO | Source: Ambulatory Visit | Attending: Radiation Oncology | Admitting: Radiation Oncology

## 2021-04-22 DIAGNOSIS — Z510 Encounter for antineoplastic radiation therapy: Secondary | ICD-10-CM | POA: Diagnosis not present

## 2021-04-23 ENCOUNTER — Ambulatory Visit
Admission: RE | Admit: 2021-04-23 | Discharge: 2021-04-23 | Disposition: A | Discharge status: Home / Self Care | Payer: Medicare PPO | Source: Ambulatory Visit | Attending: Radiation Oncology | Admitting: Radiation Oncology

## 2021-04-23 DIAGNOSIS — Z510 Encounter for antineoplastic radiation therapy: Secondary | ICD-10-CM | POA: Diagnosis not present

## 2021-04-26 ENCOUNTER — Ambulatory Visit
Admission: RE | Admit: 2021-04-26 | Discharge: 2021-04-26 | Disposition: A | Discharge status: Home / Self Care | Payer: Medicare PPO | Source: Ambulatory Visit | Attending: Radiation Oncology | Admitting: Radiation Oncology

## 2021-04-26 DIAGNOSIS — Z510 Encounter for antineoplastic radiation therapy: Secondary | ICD-10-CM | POA: Diagnosis not present

## 2021-04-27 ENCOUNTER — Ambulatory Visit
Admission: RE | Admit: 2021-04-27 | Discharge: 2021-04-27 | Disposition: A | Discharge status: Home / Self Care | Payer: Medicare PPO | Source: Ambulatory Visit | Attending: Radiation Oncology | Admitting: Radiation Oncology

## 2021-04-27 DIAGNOSIS — Z510 Encounter for antineoplastic radiation therapy: Secondary | ICD-10-CM | POA: Diagnosis not present

## 2021-04-28 ENCOUNTER — Other Ambulatory Visit: Payer: Self-pay

## 2021-04-28 ENCOUNTER — Ambulatory Visit
Admission: RE | Admit: 2021-04-28 | Discharge: 2021-04-28 | Disposition: A | Discharge status: Home / Self Care | Payer: Medicare PPO | Source: Ambulatory Visit | Attending: Radiation Oncology | Admitting: Radiation Oncology

## 2021-04-28 ENCOUNTER — Inpatient Hospital Stay: Payer: Medicare PPO

## 2021-04-28 DIAGNOSIS — Z170 Estrogen receptor positive status [ER+]: Secondary | ICD-10-CM

## 2021-04-28 DIAGNOSIS — Z510 Encounter for antineoplastic radiation therapy: Secondary | ICD-10-CM | POA: Diagnosis not present

## 2021-04-28 LAB — CBC
HCT: 40.7 % (ref 36.0–46.0)
Hemoglobin: 12.7 g/dL (ref 12.0–15.0)
MCH: 27.6 pg (ref 26.0–34.0)
MCHC: 31.2 g/dL (ref 30.0–36.0)
MCV: 88.5 fL (ref 80.0–100.0)
Platelets: 237 10*3/uL (ref 150–400)
RBC: 4.6 MIL/uL (ref 3.87–5.11)
RDW: 13.2 % (ref 11.5–15.5)
WBC: 7.4 10*3/uL (ref 4.0–10.5)
nRBC: 0 % (ref 0.0–0.2)

## 2021-04-29 ENCOUNTER — Ambulatory Visit
Admission: RE | Admit: 2021-04-29 | Discharge: 2021-04-29 | Disposition: A | Discharge status: Home / Self Care | Payer: Medicare PPO | Source: Ambulatory Visit | Attending: Radiation Oncology | Admitting: Radiation Oncology

## 2021-04-29 ENCOUNTER — Telehealth: Payer: Self-pay

## 2021-04-29 DIAGNOSIS — Z510 Encounter for antineoplastic radiation therapy: Secondary | ICD-10-CM | POA: Diagnosis not present

## 2021-04-29 NOTE — Telephone Encounter (Signed)
Pt scheduled for Final rad tx on 4/27. Please schedule MD follow 2 weeks after 4/27 and inform pt of appt. Thanks  ?

## 2021-04-30 ENCOUNTER — Ambulatory Visit
Admission: RE | Admit: 2021-04-30 | Discharge: 2021-04-30 | Disposition: A | Discharge status: Home / Self Care | Payer: Medicare PPO | Source: Ambulatory Visit | Attending: Radiation Oncology | Admitting: Radiation Oncology

## 2021-04-30 DIAGNOSIS — Z510 Encounter for antineoplastic radiation therapy: Secondary | ICD-10-CM | POA: Diagnosis not present

## 2021-05-03 ENCOUNTER — Ambulatory Visit
Admission: RE | Admit: 2021-05-03 | Discharge: 2021-05-03 | Disposition: A | Discharge status: Home / Self Care | Payer: Medicare PPO | Source: Ambulatory Visit | Attending: Radiation Oncology | Admitting: Radiation Oncology

## 2021-05-03 DIAGNOSIS — Z510 Encounter for antineoplastic radiation therapy: Secondary | ICD-10-CM | POA: Diagnosis not present

## 2021-05-03 DIAGNOSIS — C50212 Malignant neoplasm of upper-inner quadrant of left female breast: Secondary | ICD-10-CM | POA: Diagnosis present

## 2021-05-04 ENCOUNTER — Ambulatory Visit
Admission: RE | Admit: 2021-05-04 | Discharge: 2021-05-04 | Disposition: A | Discharge status: Home / Self Care | Payer: Medicare PPO | Source: Ambulatory Visit | Attending: Radiation Oncology | Admitting: Radiation Oncology

## 2021-05-04 DIAGNOSIS — Z510 Encounter for antineoplastic radiation therapy: Secondary | ICD-10-CM | POA: Diagnosis not present

## 2021-05-05 ENCOUNTER — Ambulatory Visit
Admission: RE | Admit: 2021-05-05 | Discharge: 2021-05-05 | Disposition: A | Discharge status: Home / Self Care | Payer: Medicare PPO | Source: Ambulatory Visit | Attending: Radiation Oncology | Admitting: Radiation Oncology

## 2021-05-05 DIAGNOSIS — Z510 Encounter for antineoplastic radiation therapy: Secondary | ICD-10-CM | POA: Diagnosis not present

## 2021-05-06 ENCOUNTER — Ambulatory Visit
Admission: RE | Admit: 2021-05-06 | Discharge: 2021-05-06 | Disposition: A | Discharge status: Home / Self Care | Payer: Medicare PPO | Source: Ambulatory Visit | Attending: Radiation Oncology | Admitting: Radiation Oncology

## 2021-05-06 DIAGNOSIS — Z510 Encounter for antineoplastic radiation therapy: Secondary | ICD-10-CM | POA: Diagnosis not present

## 2021-05-07 ENCOUNTER — Ambulatory Visit
Admission: RE | Admit: 2021-05-07 | Discharge: 2021-05-07 | Disposition: A | Discharge status: Home / Self Care | Payer: Medicare PPO | Source: Ambulatory Visit | Attending: Radiation Oncology | Admitting: Radiation Oncology

## 2021-05-07 DIAGNOSIS — Z510 Encounter for antineoplastic radiation therapy: Secondary | ICD-10-CM | POA: Diagnosis not present

## 2021-05-10 ENCOUNTER — Ambulatory Visit
Admission: RE | Admit: 2021-05-10 | Discharge: 2021-05-10 | Disposition: A | Discharge status: Home / Self Care | Payer: Medicare PPO | Source: Ambulatory Visit | Attending: Radiation Oncology | Admitting: Radiation Oncology

## 2021-05-10 DIAGNOSIS — Z510 Encounter for antineoplastic radiation therapy: Secondary | ICD-10-CM | POA: Diagnosis not present

## 2021-05-11 ENCOUNTER — Ambulatory Visit
Admission: RE | Admit: 2021-05-11 | Discharge: 2021-05-11 | Disposition: A | Discharge status: Home / Self Care | Payer: Medicare PPO | Source: Ambulatory Visit | Attending: Radiation Oncology | Admitting: Radiation Oncology

## 2021-05-11 DIAGNOSIS — Z510 Encounter for antineoplastic radiation therapy: Secondary | ICD-10-CM | POA: Diagnosis not present

## 2021-05-12 ENCOUNTER — Ambulatory Visit
Admission: RE | Admit: 2021-05-12 | Discharge: 2021-05-12 | Disposition: A | Discharge status: Home / Self Care | Payer: Medicare PPO | Source: Ambulatory Visit | Attending: Radiation Oncology | Admitting: Radiation Oncology

## 2021-05-12 ENCOUNTER — Inpatient Hospital Stay: Payer: Medicare PPO | Attending: Radiation Oncology

## 2021-05-12 DIAGNOSIS — Z510 Encounter for antineoplastic radiation therapy: Secondary | ICD-10-CM | POA: Diagnosis not present

## 2021-05-13 ENCOUNTER — Ambulatory Visit
Admission: RE | Admit: 2021-05-13 | Discharge: 2021-05-13 | Disposition: A | Discharge status: Home / Self Care | Payer: Medicare PPO | Source: Ambulatory Visit | Attending: Radiation Oncology | Admitting: Radiation Oncology

## 2021-05-13 DIAGNOSIS — Z510 Encounter for antineoplastic radiation therapy: Secondary | ICD-10-CM | POA: Diagnosis not present

## 2021-05-14 ENCOUNTER — Ambulatory Visit
Admission: RE | Admit: 2021-05-14 | Discharge: 2021-05-14 | Disposition: A | Discharge status: Home / Self Care | Payer: Medicare PPO | Source: Ambulatory Visit | Attending: Radiation Oncology | Admitting: Radiation Oncology

## 2021-05-14 DIAGNOSIS — Z510 Encounter for antineoplastic radiation therapy: Secondary | ICD-10-CM | POA: Diagnosis not present

## 2021-05-17 ENCOUNTER — Ambulatory Visit
Admission: RE | Admit: 2021-05-17 | Discharge: 2021-05-17 | Disposition: A | Discharge status: Home / Self Care | Payer: Medicare PPO | Source: Ambulatory Visit | Attending: Radiation Oncology | Admitting: Radiation Oncology

## 2021-05-17 DIAGNOSIS — Z510 Encounter for antineoplastic radiation therapy: Secondary | ICD-10-CM | POA: Diagnosis not present

## 2021-05-18 ENCOUNTER — Other Ambulatory Visit: Payer: Self-pay

## 2021-05-18 ENCOUNTER — Ambulatory Visit
Admission: RE | Admit: 2021-05-18 | Discharge: 2021-05-18 | Disposition: A | Discharge status: Home / Self Care | Payer: Medicare PPO | Source: Ambulatory Visit | Attending: Radiation Oncology | Admitting: Radiation Oncology

## 2021-05-18 DIAGNOSIS — Z510 Encounter for antineoplastic radiation therapy: Secondary | ICD-10-CM | POA: Diagnosis not present

## 2021-05-18 LAB — RAD ONC ARIA SESSION SUMMARY
Course Elapsed Days: 35
Plan Fractions Treated to Date: 26
Plan Prescribed Dose Per Fraction: 1.8 Gy
Plan Total Fractions Prescribed: 28
Plan Total Prescribed Dose: 50.4 Gy
Reference Point Dosage Given to Date: 46.8 Gy
Reference Point Session Dosage Given: 1.8 Gy
Session Number: 26

## 2021-05-19 ENCOUNTER — Other Ambulatory Visit: Payer: Self-pay

## 2021-05-19 ENCOUNTER — Ambulatory Visit
Admission: RE | Admit: 2021-05-19 | Discharge: 2021-05-19 | Disposition: A | Discharge status: Home / Self Care | Payer: Medicare PPO | Source: Ambulatory Visit | Attending: Radiation Oncology | Admitting: Radiation Oncology

## 2021-05-19 DIAGNOSIS — Z510 Encounter for antineoplastic radiation therapy: Secondary | ICD-10-CM | POA: Diagnosis not present

## 2021-05-19 LAB — RAD ONC ARIA SESSION SUMMARY
Course Elapsed Days: 36
Plan Fractions Treated to Date: 27
Plan Prescribed Dose Per Fraction: 1.8 Gy
Plan Total Fractions Prescribed: 28
Plan Total Prescribed Dose: 50.4 Gy
Reference Point Dosage Given to Date: 48.6 Gy
Reference Point Session Dosage Given: 1.8 Gy
Session Number: 27

## 2021-05-20 ENCOUNTER — Ambulatory Visit
Admission: RE | Admit: 2021-05-20 | Discharge: 2021-05-20 | Disposition: A | Discharge status: Home / Self Care | Payer: Medicare PPO | Source: Ambulatory Visit | Attending: Radiation Oncology | Admitting: Radiation Oncology

## 2021-05-20 ENCOUNTER — Other Ambulatory Visit: Payer: Self-pay

## 2021-05-20 DIAGNOSIS — Z510 Encounter for antineoplastic radiation therapy: Secondary | ICD-10-CM | POA: Diagnosis not present

## 2021-05-20 LAB — RAD ONC ARIA SESSION SUMMARY
Course Elapsed Days: 37
Plan Fractions Treated to Date: 28
Plan Prescribed Dose Per Fraction: 1.8 Gy
Plan Total Fractions Prescribed: 28
Plan Total Prescribed Dose: 50.4 Gy
Reference Point Dosage Given to Date: 50.4 Gy
Reference Point Session Dosage Given: 1.8 Gy
Session Number: 28

## 2021-05-21 ENCOUNTER — Ambulatory Visit: Payer: Medicare PPO

## 2021-05-21 ENCOUNTER — Ambulatory Visit
Admission: RE | Admit: 2021-05-21 | Discharge: 2021-05-21 | Disposition: A | Discharge status: Home / Self Care | Payer: Medicare PPO | Source: Ambulatory Visit | Attending: Radiation Oncology | Admitting: Radiation Oncology

## 2021-05-21 ENCOUNTER — Other Ambulatory Visit: Payer: Self-pay

## 2021-05-21 DIAGNOSIS — Z510 Encounter for antineoplastic radiation therapy: Secondary | ICD-10-CM | POA: Diagnosis not present

## 2021-05-21 LAB — RAD ONC ARIA SESSION SUMMARY
Course Elapsed Days: 38
Plan Fractions Treated to Date: 1
Plan Prescribed Dose Per Fraction: 2 Gy
Plan Total Fractions Prescribed: 5
Plan Total Prescribed Dose: 10 Gy
Reference Point Dosage Given to Date: 52.4 Gy
Reference Point Session Dosage Given: 2 Gy
Session Number: 29

## 2021-05-24 ENCOUNTER — Ambulatory Visit: Payer: Medicare PPO

## 2021-05-25 ENCOUNTER — Ambulatory Visit: Payer: Medicare PPO

## 2021-05-26 ENCOUNTER — Ambulatory Visit: Payer: Medicare PPO

## 2021-05-27 ENCOUNTER — Ambulatory Visit: Payer: Medicare PPO

## 2021-05-27 ENCOUNTER — Ambulatory Visit
Admission: RE | Admit: 2021-05-27 | Discharge: 2021-05-27 | Disposition: A | Discharge status: Home / Self Care | Payer: Medicare PPO | Source: Ambulatory Visit | Attending: Radiation Oncology | Admitting: Radiation Oncology

## 2021-05-27 ENCOUNTER — Other Ambulatory Visit: Payer: Self-pay

## 2021-05-27 DIAGNOSIS — Z510 Encounter for antineoplastic radiation therapy: Secondary | ICD-10-CM | POA: Diagnosis not present

## 2021-05-27 LAB — RAD ONC ARIA SESSION SUMMARY
Course Elapsed Days: 44
Plan Fractions Treated to Date: 2
Plan Prescribed Dose Per Fraction: 2 Gy
Plan Total Fractions Prescribed: 5
Plan Total Prescribed Dose: 10 Gy
Reference Point Dosage Given to Date: 54.4 Gy
Reference Point Session Dosage Given: 2 Gy
Session Number: 30

## 2021-05-28 ENCOUNTER — Ambulatory Visit
Admission: RE | Admit: 2021-05-28 | Discharge: 2021-05-28 | Disposition: A | Discharge status: Home / Self Care | Payer: Medicare PPO | Source: Ambulatory Visit | Attending: Radiation Oncology | Admitting: Radiation Oncology

## 2021-05-28 ENCOUNTER — Other Ambulatory Visit: Payer: Self-pay

## 2021-05-28 DIAGNOSIS — Z510 Encounter for antineoplastic radiation therapy: Secondary | ICD-10-CM | POA: Diagnosis not present

## 2021-05-28 LAB — RAD ONC ARIA SESSION SUMMARY
Course Elapsed Days: 45
Plan Fractions Treated to Date: 3
Plan Prescribed Dose Per Fraction: 2 Gy
Plan Total Fractions Prescribed: 5
Plan Total Prescribed Dose: 10 Gy
Reference Point Dosage Given to Date: 56.4 Gy
Reference Point Session Dosage Given: 2 Gy
Session Number: 31

## 2021-05-31 ENCOUNTER — Other Ambulatory Visit: Payer: Self-pay

## 2021-05-31 ENCOUNTER — Ambulatory Visit
Admission: RE | Admit: 2021-05-31 | Discharge: 2021-05-31 | Disposition: A | Discharge status: Home / Self Care | Payer: Medicare PPO | Source: Ambulatory Visit | Attending: Radiation Oncology | Admitting: Radiation Oncology

## 2021-05-31 DIAGNOSIS — C50212 Malignant neoplasm of upper-inner quadrant of left female breast: Secondary | ICD-10-CM | POA: Diagnosis present

## 2021-05-31 DIAGNOSIS — Z510 Encounter for antineoplastic radiation therapy: Secondary | ICD-10-CM | POA: Diagnosis present

## 2021-05-31 LAB — RAD ONC ARIA SESSION SUMMARY
Course Elapsed Days: 48
Plan Fractions Treated to Date: 4
Plan Prescribed Dose Per Fraction: 2 Gy
Plan Total Fractions Prescribed: 5
Plan Total Prescribed Dose: 10 Gy
Reference Point Dosage Given to Date: 58.4 Gy
Reference Point Session Dosage Given: 2 Gy
Session Number: 32

## 2021-06-01 ENCOUNTER — Ambulatory Visit: Payer: Medicare PPO

## 2021-06-01 ENCOUNTER — Other Ambulatory Visit: Payer: Self-pay

## 2021-06-01 ENCOUNTER — Ambulatory Visit
Admission: RE | Admit: 2021-06-01 | Discharge: 2021-06-01 | Disposition: A | Discharge status: Home / Self Care | Payer: Medicare PPO | Source: Ambulatory Visit | Attending: Radiation Oncology | Admitting: Radiation Oncology

## 2021-06-01 DIAGNOSIS — Z510 Encounter for antineoplastic radiation therapy: Secondary | ICD-10-CM | POA: Diagnosis not present

## 2021-06-01 LAB — RAD ONC ARIA SESSION SUMMARY
Course Elapsed Days: 49
Plan Fractions Treated to Date: 5
Plan Prescribed Dose Per Fraction: 2 Gy
Plan Total Fractions Prescribed: 5
Plan Total Prescribed Dose: 10 Gy
Reference Point Dosage Given to Date: 60.4 Gy
Reference Point Session Dosage Given: 2 Gy
Session Number: 33

## 2021-06-02 ENCOUNTER — Ambulatory Visit: Payer: Medicare PPO

## 2021-06-10 ENCOUNTER — Encounter: Payer: Self-pay | Admitting: Oncology

## 2021-06-10 ENCOUNTER — Inpatient Hospital Stay: Payer: Medicare PPO | Attending: Oncology | Admitting: Oncology

## 2021-06-10 VITALS — BP 164/79 | HR 63 | Temp 97.4°F | Resp 18 | Wt 183.9 lb

## 2021-06-10 DIAGNOSIS — I428 Other cardiomyopathies: Secondary | ICD-10-CM | POA: Diagnosis not present

## 2021-06-10 DIAGNOSIS — Z170 Estrogen receptor positive status [ER+]: Secondary | ICD-10-CM

## 2021-06-10 DIAGNOSIS — Z880 Allergy status to penicillin: Secondary | ICD-10-CM | POA: Diagnosis not present

## 2021-06-10 DIAGNOSIS — Z8261 Family history of arthritis: Secondary | ICD-10-CM | POA: Insufficient documentation

## 2021-06-10 DIAGNOSIS — I2510 Atherosclerotic heart disease of native coronary artery without angina pectoris: Secondary | ICD-10-CM | POA: Diagnosis not present

## 2021-06-10 DIAGNOSIS — Z8349 Family history of other endocrine, nutritional and metabolic diseases: Secondary | ICD-10-CM | POA: Insufficient documentation

## 2021-06-10 DIAGNOSIS — I447 Left bundle-branch block, unspecified: Secondary | ICD-10-CM | POA: Diagnosis not present

## 2021-06-10 DIAGNOSIS — Z833 Family history of diabetes mellitus: Secondary | ICD-10-CM | POA: Insufficient documentation

## 2021-06-10 DIAGNOSIS — I6529 Occlusion and stenosis of unspecified carotid artery: Secondary | ICD-10-CM | POA: Diagnosis not present

## 2021-06-10 DIAGNOSIS — Z881 Allergy status to other antibiotic agents status: Secondary | ICD-10-CM | POA: Insufficient documentation

## 2021-06-10 DIAGNOSIS — Z79899 Other long term (current) drug therapy: Secondary | ICD-10-CM | POA: Insufficient documentation

## 2021-06-10 DIAGNOSIS — Z9049 Acquired absence of other specified parts of digestive tract: Secondary | ICD-10-CM | POA: Diagnosis not present

## 2021-06-10 DIAGNOSIS — M109 Gout, unspecified: Secondary | ICD-10-CM | POA: Diagnosis not present

## 2021-06-10 DIAGNOSIS — M8580 Other specified disorders of bone density and structure, unspecified site: Secondary | ICD-10-CM | POA: Diagnosis not present

## 2021-06-10 DIAGNOSIS — C50912 Malignant neoplasm of unspecified site of left female breast: Secondary | ICD-10-CM

## 2021-06-10 DIAGNOSIS — Z87891 Personal history of nicotine dependence: Secondary | ICD-10-CM | POA: Insufficient documentation

## 2021-06-10 DIAGNOSIS — Z8249 Family history of ischemic heart disease and other diseases of the circulatory system: Secondary | ICD-10-CM | POA: Insufficient documentation

## 2021-06-10 DIAGNOSIS — K219 Gastro-esophageal reflux disease without esophagitis: Secondary | ICD-10-CM | POA: Diagnosis not present

## 2021-06-10 DIAGNOSIS — I10 Essential (primary) hypertension: Secondary | ICD-10-CM | POA: Diagnosis not present

## 2021-06-10 DIAGNOSIS — C50212 Malignant neoplasm of upper-inner quadrant of left female breast: Secondary | ICD-10-CM

## 2021-06-10 HISTORY — DX: Malignant neoplasm of unspecified site of left female breast: C50.912

## 2021-06-10 MED ORDER — OYSTER SHELL CALCIUM/D3 500-5 MG-MCG PO TABS: 2.0000 | ORAL_TABLET | Freq: Every day | ORAL | 5 refills | Status: DC

## 2021-06-10 MED ORDER — ANASTROZOLE 1 MG PO TABS: 1.0000 mg | ORAL_TABLET | Freq: Every day | ORAL | 3 refills | Status: DC

## 2021-06-10 NOTE — Progress Notes (Signed)
?Hematology/Oncology Progress note ?[phone] [phone] ?  ? ?  ? ?   ? ? ?Patient Care Team: ?Marinda Elk, MD as PCP - General (Physician Assistant) ?Earlie Server, MD as Consulting Physician (Hematology and Oncology) ?Noreene Filbert, MD as Consulting Physician (Radiation Oncology) ?Herbert Pun, MD as Consulting Physician (General Surgery) ? ?REFERRING PROVIDER: ?Marinda Elk, MD  ?CHIEF COMPLAINTS/REASON FOR VISIT:  ?left breast cancer ? ?HISTORY OF PRESENTING ILLNESS:  ? ?Brenda Combs is a  76 y.o.  female with PMH listed below was seen in consultation at the request of  Marinda Elk, MD  for evaluation of left breast cancer ? ?12/18/2020, screening mammogram bilaterally showed possible mass warrants further evaluation.  Right breast has no findings suspicious for malignancy. ?12/28/2020, left breast diagnostic mammogram showed 11:00 1.4 cm suspicious mass.  Associated calcifications in linear distribution spanning 4 cm.  There is no evidence of left axillary lymphadenopathy. ?01/07/2021, patient underwent ultrasound-guided left breast 11:00 mass biopsy   Pathology showed detached fragment of papillary lesion with sclerosis and atypia. and stereotactic biopsy of left upper inner quadrant calcification.  The pathology showed papillary lesion with atypia.  Calcifications are present.  No definitive evidence of invasive carcinoma in these biopsies. ? ?03/01/2021, patient underwent left breast lumpectomy by Dr. Peyton Najjar. 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 ?Margins are negative for both invasive carcinoma and DCIS. ? pT32m pNx ?ER+ [ER expression not visualizing the invasive component due to small size and suboptimal tissue sections.]-PR + ?HER2 negative ? ?Patient denies any family history of breast cancer. ?Menarche  [age]years old ?Patient has 3 children.  Age at first birth, 89years of age, ?Patient took OCP pills in the past. ?Menopause in 89s ?Denies any HRT.  Denies any previous breast biopsy. ? ?Patient has a past medical history of arthritis, carotid artery stenosis, CAD, GERD, gout, heart murmur, hypertension, left bundle branch block, nonischemic cardiomyopathy with LVEF 35-40%. ? ?INTERVAL HISTORY ?Brenda Kaganis a 76y.o. female who has above history reviewed by me today presents for follow up visit for management of left breast DCIS with microinvasive carcinoma. ?04/13/2021,-05/20/2021, patient is status post adjuvant radiation to the left breast.  She tolerates with dermal toxicities. ? ?Today patient present to discuss about adjuvant endocrine therapy. ? ?Review of Systems  ?Constitutional:  Negative for appetite change, chills, fatigue and fever.  ?HENT:   Negative for hearing loss and voice change.   ?Eyes:  Negative for eye problems.  ?Respiratory:  Negative for chest tightness and cough.   ?Cardiovascular:  Negative for chest pain.  ?Gastrointestinal:  Negative for abdominal distention, abdominal pain and blood in stool.  ?Endocrine: Negative for hot flashes.  ?Genitourinary:  Negative for difficulty urinating and frequency.   ?Musculoskeletal:  Negative for arthralgias.  ?Skin:  Negative for itching and rash.  ?Neurological:  Negative for extremity weakness.  ?Hematological:  Negative for adenopathy.  ?Psychiatric/Behavioral:  Negative for confusion.   ? ?MEDICAL HISTORY:  ?Past Medical History:  ?Diagnosis Date  ? Arthritis   ? Bilateral carotid artery stenosis   ? CAD (coronary artery disease)   ? GERD (gastroesophageal reflux disease)   ? Gout   ? Heart murmur   ? History of abnormal Pap smear   ? Hyperlipidemia   ? Hypertension   ? LBBB (left bundle branch block) 01/20/2021  ? a.) noted on preoperative ECG  ?  NICM (nonischemic cardiomyopathy) ([place]) 02/15/2021  ? a.) TTE 02/15/2021: ED 03-47%; mod LV  systolic dysfunction with LVH; mild panvalvular regurgitation. b.) Lexiscan 02/15/2021: EF 21%; global LV systolic dysfunction; no significant ischemia.  ? SOB (shortness of breath) on exertion   ? ? ?SURGICAL HISTORY: ?Past Surgical History:  ?Procedure Laterality Date  ? APPENDECTOMY    ? BREAST BIOPSY Left 01/07/2021  ? stereo bx calcs, coil marker, path pending  ? BREAST BIOPSY Left 01/07/2021  ? [place] bx of calcs, heart marker, path pending  ? BREAST BIOPSY Left 03/01/2021  ? Procedure: Excisional BREAST BIOPSY WITH NEEDLE LOCALIZATION (bracket);  Surgeon: Herbert Pun, MD;  Location: [facility] ORS;  Service: General;  Laterality: Left;  ? COLONOSCOPY WITH PROPOFOL N/A 11/23/2015  ? Procedure: COLONOSCOPY WITH PROPOFOL;  Surgeon: Manya Silvas, MD;  Location[facility] ENDOSCOPY;  Service: Endoscopy;  Laterality: N/A;  ? COLONOSCOPY WITH PROPOFOL N/A 02/07/2017  ? Procedure: COLONOSCOPY WITH PROPOFOL;  Surgeon: [name], MD;  Location: [facility] ENDOSCOPY;  Service: Endoscopy;  Laterality: N/A;  ? ESOPHAGOGASTRODUODENOSCOPY (EGD) WITH PROPOFOL N/A 11/23/2015  ? Procedure: ESOPHAGOGASTRODUODENOSCOPY (EGD) WITH PROPOFOL;  Surgeon: Manya Silvas, MD;  Location: [facility] ENDOSCOPY;  Service: Endoscopy;  Laterality: N/A;  ? TUBAL LIGATION    ? WISDOM TOOTH EXTRACTION    ? x 2  ? ? ?SOCIAL HISTORY: ?Social History  ? ?Socioeconomic History  ? Marital status: Widowed  ?  Spouse name: Not on file  ? Number of children: Not on file  ? Years of education: Not on file  ? Highest education level: Not on file  ?Occupational History  ? Not on file  ?Tobacco Use  ? Smoking status: Former  ?  Packs/day: 0.75  ?  Years: 40.00  ?  Pack years: 30.00  ?  Types: Cigarettes  ?  Quit date: 02/28/2021  ?  Years since quitting: 0.2  ? Smokeless tobacco: Never  ?Vaping Use  ? Vaping Use: Never used  ?Substance and Sexual Activity  ? Alcohol use: No  ? Drug use: No  ? Sexual activity: Not Currently  ?  Birth control/protection:  Post-menopausal  ?  Comment: widow since 2000  ?Other Topics Concern  ? Not on file  ?Social History Narrative  ? Lives with daughter  ? ?Social Determinants of Health  ? ?Financial Resource Strain: Not on file  ?Food Insecurity: Not on file  ?Transportation Needs: Not on file  ?Physical Activity: Not on file  ?Stress: Not on file  ?Social Connections: Not on file  ?Intimate Partner Violence: Not on file  ? ? ?FAMILY HISTORY: ?Family History  ?Problem Relation Age of Onset  ? Arthritis Mother   ? Hyperlipidemia Mother   ? Hypertension Mother   ? Hyperlipidemia Sister   ? Hypertension Sister   ? Hypertension Daughter   ? Diabetes Sister   ? Hyperlipidemia Sister   ? Hypertension Sister   ? Hypertension Daughter   ? Hypertension Daughter   ? Breast cancer Neg Hx   ? ? ?ALLERGIES:  is allergic to augmentin [amoxicillin-pot clavulanate] and zyrtec [cetirizine hcl]. ? ?MEDICATIONS:  ?Current Outpatient Medications  ?Medication Sig Dispense Refill  ? allopurinol (ZYLOPRIM) 100 MG tablet Take 100 mg by mouth daily.    ? amLODipine (NORVASC) 5 MG tablet Take 7.5 mg by mouth daily.    ? anastrozole (ARIMIDEX) 1 MG tablet Take 1 tablet (1 mg total) by mouth daily. 30 tablet 3  ? aspirin 81 MG tablet Take  81 mg by mouth daily.    ? atorvastatin (LIPITOR) 40 MG tablet Take 40 mg by mouth daily.    ? calcium-vitamin D (OSCAL WITH D) 500-5 MG-MCG tablet Take 2 tablets by mouth daily. 60 tablet 5  ? carvedilol (COREG) 6.25 MG tablet Take 6.25 mg by mouth 2 (two) times daily.    ? cyclobenzaprine (FLEXERIL) 5 MG tablet Take 5 mg by mouth 2 (two) times daily as needed for muscle spasms.    ? fluticasone (FLONASE) 50 MCG/ACT nasal spray Place 1 spray into both nostrils daily.    ? naproxen (NAPROSYN) 500 MG tablet Take 500 mg by mouth 2 (two) times daily as needed for pain.    ? olmesartan (BENICAR) 40 MG tablet Take 40 mg by mouth daily.    ? omeprazole (PRILOSEC) 40 MG capsule Take 40 mg by mouth 2 (two) times daily.    ?  spironolactone (ALDACTONE) 25 MG tablet Take 25 mg by mouth daily.    ? ?No current facility-administered medications for this visit.  ? ? ? ?PHYSICAL EXAMINATION: ?ECOG PERFORMANCE STATUS: 0 - Asymptomatic ?Vitals:

## 2021-06-10 NOTE — Progress Notes (Signed)
Pt here for follow up. No new concerns voiced.   

## 2021-07-02 ENCOUNTER — Ambulatory Visit
Admission: RE | Admit: 2021-07-02 | Discharge: 2021-07-02 | Disposition: A | Discharge status: Home / Self Care | Payer: Medicare PPO | Source: Ambulatory Visit | Attending: Radiation Oncology | Admitting: Radiation Oncology

## 2021-07-02 VITALS — BP 159/79 | HR 62 | Temp 97.6°F | Resp 16 | Ht 62.0 in | Wt 184.7 lb

## 2021-07-02 DIAGNOSIS — Z79811 Long term (current) use of aromatase inhibitors: Secondary | ICD-10-CM | POA: Diagnosis not present

## 2021-07-02 DIAGNOSIS — Z170 Estrogen receptor positive status [ER+]: Secondary | ICD-10-CM | POA: Diagnosis not present

## 2021-07-02 DIAGNOSIS — Z923 Personal history of irradiation: Secondary | ICD-10-CM | POA: Diagnosis not present

## 2021-07-02 DIAGNOSIS — C50212 Malignant neoplasm of upper-inner quadrant of left female breast: Secondary | ICD-10-CM

## 2021-07-02 NOTE — Progress Notes (Signed)
Radiation Oncology Follow up Note  Name: Brenda Combs   Date:   07/02/2021 MRN:  [id number] DOB: 06/15/1945    This 76 y.o. female presents to the clinic today for 1 month follow-up status post whole breast radiation to her left breast for stage Ia ER/PR positive microinvasive carcinoma.  REFERRING PROVIDER: Marinda Elk, MD  HPI: Patient is a 76 year old female now out 1 month having completed whole breast radiation to her left breast for stage Ia microinvasive ER/PR positive carcinoma.  Seen today in routine follow-up she is doing well..  She specifically denies breast tenderness cough or bone pain she still has significant hyperpigmentation of the skin which have assured will resolve over time.  She has been started on Arimidex tolerating it well without side effect.  COMPLICATIONS OF TREATMENT: none  FOLLOW UP COMPLIANCE: keeps appointments   PHYSICAL EXAM:  BP (!) 159/79 (BP Location: Right Arm, Patient Position: Sitting, Cuff Size: Small)   Pulse 62   Temp 97.6 F (36.4 C) (Tympanic)   Resp 16   Ht '5\' 2"'$  (1.575 m) Comment: stated HT  Wt 184 lb 11.2 oz (83.8 kg)   BMI 33.78 kg/m  Lungs are clear to A&P cardiac examination essentially unremarkable with regular rate and rhythm. No dominant mass or nodularity is noted in either breast in 2 positions examined. Incision is well-healed. No axillary or supraclavicular adenopathy is appreciated. Cosmetic result is excellent.  Still some significant hyperpigmentation of the skin.  Also still has a present seroma in the left breast.  Well-developed well-nourished patient in NAD. HEENT reveals PERLA, EOMI, discs not visualized.  Oral cavity is clear. No oral mucosal lesions are identified. Neck is clear without evidence of cervical or supraclavicular adenopathy. Lungs are clear to A&P. Cardiac examination is essentially unremarkable with regular rate and rhythm without murmur rub or thrill. Abdomen is benign with no organomegaly or  masses noted. Motor sensory and DTR levels are equal and symmetric in the upper and lower extremities. Cranial nerves II through XII are grossly intact. Proprioception is intact. No peripheral adenopathy or edema is identified. No motor or sensory levels are noted. Crude visual fields are within normal range.  RADIOLOGY RESULTS: No current films for review  PLAN: At the present time patient is doing well 1 month out from whole breast radiation still with significant hyperpigmentation which I have assured her will resolve over time.  I am pleased with her overall progress have asked to see her back in 5 months for follow-up.  Patient knows to call with any concerns.  She continues on Arimidex without side effect.  I would like to take this opportunity to thank you for allowing me to participate in the care of your patient.Noreene Filbert, MD

## 2021-07-14 ENCOUNTER — Ambulatory Visit
Admission: RE | Admit: 2021-07-14 | Discharge: 2021-07-14 | Disposition: A | Discharge status: Home / Self Care | Payer: Medicare PPO | Source: Ambulatory Visit | Attending: Oncology | Admitting: Oncology

## 2021-07-14 DIAGNOSIS — Z780 Asymptomatic menopausal state: Secondary | ICD-10-CM | POA: Diagnosis not present

## 2021-07-14 DIAGNOSIS — M8589 Other specified disorders of bone density and structure, multiple sites: Secondary | ICD-10-CM | POA: Insufficient documentation

## 2021-07-14 DIAGNOSIS — Z853 Personal history of malignant neoplasm of breast: Secondary | ICD-10-CM | POA: Insufficient documentation

## 2021-07-14 DIAGNOSIS — Z923 Personal history of irradiation: Secondary | ICD-10-CM | POA: Diagnosis not present

## 2021-07-14 DIAGNOSIS — Z13820 Encounter for screening for osteoporosis: Secondary | ICD-10-CM | POA: Diagnosis present

## 2021-07-14 DIAGNOSIS — C50212 Malignant neoplasm of upper-inner quadrant of left female breast: Secondary | ICD-10-CM

## 2021-09-10 ENCOUNTER — Other Ambulatory Visit: Payer: Medicare PPO

## 2021-09-10 ENCOUNTER — Ambulatory Visit: Payer: Medicare PPO | Admitting: Oncology

## 2021-09-10 ENCOUNTER — Encounter: Payer: Self-pay | Admitting: Oncology

## 2021-09-10 ENCOUNTER — Inpatient Hospital Stay: Payer: Medicare PPO | Attending: Oncology

## 2021-09-10 ENCOUNTER — Inpatient Hospital Stay: Payer: Medicare PPO | Admitting: Oncology

## 2021-09-10 VITALS — BP 150/67 | HR 64 | Temp 97.2°F | Resp 18 | Wt 190.0 lb

## 2021-09-10 DIAGNOSIS — Z79811 Long term (current) use of aromatase inhibitors: Secondary | ICD-10-CM

## 2021-09-10 DIAGNOSIS — Z8261 Family history of arthritis: Secondary | ICD-10-CM | POA: Diagnosis not present

## 2021-09-10 DIAGNOSIS — M1990 Unspecified osteoarthritis, unspecified site: Secondary | ICD-10-CM | POA: Insufficient documentation

## 2021-09-10 DIAGNOSIS — Z79899 Other long term (current) drug therapy: Secondary | ICD-10-CM | POA: Diagnosis not present

## 2021-09-10 DIAGNOSIS — I10 Essential (primary) hypertension: Secondary | ICD-10-CM | POA: Diagnosis not present

## 2021-09-10 DIAGNOSIS — I428 Other cardiomyopathies: Secondary | ICD-10-CM | POA: Diagnosis not present

## 2021-09-10 DIAGNOSIS — C50212 Malignant neoplasm of upper-inner quadrant of left female breast: Secondary | ICD-10-CM

## 2021-09-10 DIAGNOSIS — M8580 Other specified disorders of bone density and structure, unspecified site: Secondary | ICD-10-CM | POA: Insufficient documentation

## 2021-09-10 DIAGNOSIS — Z880 Allergy status to penicillin: Secondary | ICD-10-CM | POA: Insufficient documentation

## 2021-09-10 DIAGNOSIS — Z8349 Family history of other endocrine, nutritional and metabolic diseases: Secondary | ICD-10-CM | POA: Insufficient documentation

## 2021-09-10 DIAGNOSIS — M109 Gout, unspecified: Secondary | ICD-10-CM | POA: Insufficient documentation

## 2021-09-10 DIAGNOSIS — Z881 Allergy status to other antibiotic agents status: Secondary | ICD-10-CM | POA: Diagnosis not present

## 2021-09-10 DIAGNOSIS — K219 Gastro-esophageal reflux disease without esophagitis: Secondary | ICD-10-CM | POA: Insufficient documentation

## 2021-09-10 DIAGNOSIS — M2550 Pain in unspecified joint: Secondary | ICD-10-CM | POA: Insufficient documentation

## 2021-09-10 DIAGNOSIS — Z170 Estrogen receptor positive status [ER+]: Secondary | ICD-10-CM

## 2021-09-10 DIAGNOSIS — Z8249 Family history of ischemic heart disease and other diseases of the circulatory system: Secondary | ICD-10-CM | POA: Diagnosis not present

## 2021-09-10 DIAGNOSIS — I2510 Atherosclerotic heart disease of native coronary artery without angina pectoris: Secondary | ICD-10-CM | POA: Insufficient documentation

## 2021-09-10 DIAGNOSIS — Z833 Family history of diabetes mellitus: Secondary | ICD-10-CM | POA: Diagnosis not present

## 2021-09-10 DIAGNOSIS — Z9049 Acquired absence of other specified parts of digestive tract: Secondary | ICD-10-CM | POA: Insufficient documentation

## 2021-09-10 LAB — CBC WITH DIFFERENTIAL/PLATELET
Abs Immature Granulocytes: 0.02 10*3/uL (ref 0.00–0.07)
Basophils Absolute: 0 10*3/uL (ref 0.0–0.1)
Basophils Relative: 1 %
Eosinophils Absolute: 0.1 10*3/uL (ref 0.0–0.5)
Eosinophils Relative: 1 %
HCT: 40.1 % (ref 36.0–46.0)
Hemoglobin: 12.6 g/dL (ref 12.0–15.0)
Immature Granulocytes: 0 %
Lymphocytes Relative: 24 %
Lymphs Abs: 1.5 10*3/uL (ref 0.7–4.0)
MCH: 27.9 pg (ref 26.0–34.0)
MCHC: 31.4 g/dL (ref 30.0–36.0)
MCV: 88.9 fL (ref 80.0–100.0)
Monocytes Absolute: 0.6 10*3/uL (ref 0.1–1.0)
Monocytes Relative: 10 %
Neutro Abs: 4.1 10*3/uL (ref 1.7–7.7)
Neutrophils Relative %: 64 %
Platelets: 223 10*3/uL (ref 150–400)
RBC: 4.51 MIL/uL (ref 3.87–5.11)
RDW: 13.2 % (ref 11.5–15.5)
WBC: 6.4 10*3/uL (ref 4.0–10.5)
nRBC: 0 % (ref 0.0–0.2)

## 2021-09-10 LAB — COMPREHENSIVE METABOLIC PANEL
ALT: 16 U/L (ref 0–44)
AST: 20 U/L (ref 15–41)
Albumin: 4.3 g/dL (ref 3.5–5.0)
Alkaline Phosphatase: 67 U/L (ref 38–126)
Anion gap: 11 (ref 5–15)
BUN: 11 mg/dL (ref 8–23)
CO2: 25 mmol/L (ref 22–32)
Calcium: 9.3 mg/dL (ref 8.9–10.3)
Chloride: 103 mmol/L (ref 98–111)
Creatinine, Ser: 1.02 mg/dL — ABNORMAL HIGH (ref 0.44–1.00)
GFR, Estimated: 57 mL/min — ABNORMAL LOW (ref >60)
Glucose, Bld: 113 mg/dL — ABNORMAL HIGH (ref 70–99)
Potassium: 3.7 mmol/L (ref 3.5–5.1)
Sodium: 139 mmol/L (ref 135–145)
Total Bilirubin: 0.7 mg/dL (ref 0.3–1.2)
Total Protein: 7.2 g/dL (ref 6.5–8.1)

## 2021-09-10 MED ORDER — EXEMESTANE 25 MG PO TABS: 25.0000 mg | ORAL_TABLET | Freq: Every day | ORAL | 4 refills | Status: DC

## 2021-09-10 NOTE — Progress Notes (Signed)
Survivorship Care Plan visit completed.  Treatment summary reviewed and given to patient.  ASCO answers booklet reviewed and given to patient.  CARE program and Cancer Transitions discussed with patient along with other resources cancer center offers to patients and caregivers.  Patient verbalized understanding.    

## 2021-09-10 NOTE — Progress Notes (Unsigned)
Patient here for follow up. Pt reports she stopped taking Letrozole approx 2 weeks ago because it was causing joint pain.

## 2021-09-11 DIAGNOSIS — Z79811 Long term (current) use of aromatase inhibitors: Secondary | ICD-10-CM | POA: Insufficient documentation

## 2021-09-11 NOTE — Assessment & Plan Note (Signed)
Recommend patient to take Calcium and Vitamin D supplementation.

## 2021-09-11 NOTE — Progress Notes (Addendum)
Hematology/Oncology Progress note [phone] [phone]            Patient Care Team: Marinda Elk, MD as PCP - General (Physician Assistant) Earlie Server, MD as Consulting Physician (Hematology and Oncology) Noreene Filbert, MD as Consulting Physician (Radiation Oncology) Herbert Pun, MD as Consulting Physician (General Surgery)  ASSESSMENT & PLAN:   Cancer Staging  Malignant neoplasm of upper-inner quadrant of left breast in female, estrogen receptor positive ([place]) Staging form: Breast, AJCC 8th Edition - Pathologic: Stage Unknown (pT46m, pNX, cM0, G1, ER+, PR+, HER2-) - Signed by YEarlie Server MD on 06/10/2021   Malignant neoplasm of upper-inner quadrant of left breast in female, estrogen receptor positive ([facility] #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 Additional sentinel lymph node biopsy was omitted due to patient's age. Not able to Arimidex, discussed about switching to Aromasin. She is interested to try and will update me.  Rx sent.   Obtain bilateral diagnostic mammogram in Nov 2023.   Aromatase inhibitor use Recommend patient to take Calcium and Vitamin D supplementation.  Orders Placed This Encounter  Procedures   MM DIAG BREAST TOMO BILATERAL    Standing Status:   Future    Standing Expiration Date:   09/11/2022    Order Specific Question:   Reason for Exam (SYMPTOM  OR DIAGNOSIS REQUIRED)    Answer:   hostpry of breast mass    Order Specific Question:   Preferred imaging location?    Answer:   [facility]   [facility]    Standing Status:   Future    Standing Expiration Date:   09/11/2022    Order Specific Question:   Reason for Exam (SYMPTOM  OR DIAGNOSIS REQUIRED)    Answer:   history of breast mass    Order Specific Question:   Preferred imaging location?    Answer:   [facility]    [facility] [place]    Standing Status:   Future    Standing Expiration Date:   09/11/2022    Order Specific Question:   Reason for Exam (SYMPTOM  OR DIAGNOSIS REQUIRED)    Answer:   history of breast mass    Order Specific Question:   Preferred imaging location?    Answer:   [facility]   Basic metabolic panel    Standing Status:   Future    Number of Occurrences:   1    Standing Expiration Date:   09/10/2022   CBC with Differential/Platelet    Standing Status:   Future    Standing Expiration Date:   09/11/2022   Comprehensive metabolic panel    Standing Status:   Future    Standing Expiration Date:   09/11/2022   Follow up  6 weeks lab BMP Bilateral diagnostic mammogram Nov 2023 Prior 3 months flex lab MD cbc cmp  All questions were answered. The patient knows to call the clinic with any problems, questions or concerns.  ZhEarlie ServerMD, PhD [facility] 09/10/2021   CHIEF COMPLAINTS/REASON FOR VISIT:  left breast cancer  HISTORY OF PRESENTING ILLNESS:   Brenda Combs a  [age].o.  female presents for follow up of left breast cancer Oncology History  Malignant neoplasm of upper-inner quadrant of left breast in female, estrogen receptor positive ([facility]   12/18/2020, screening mammogram bilaterally showed possible mass warrants  further evaluation.  Right breast has no findings suspicious for malignancy. 12/28/2020, left breast diagnostic mammogram showed 11:00 1.4 cm suspicious mass.  Associated calcifications in linear distribution spanning 4 cm.  There is no evidence of left axillary lymphadenopathy.   01/07/2021 Initial Diagnosis   Malignant neoplasm of upper-inner quadrant of left breast in female, estrogen receptor positive   -Ultrasound-guided left breast 11:00 mass biopsy   Pathology showed detached fragment of papillary lesion with sclerosis and atypia. and stereotactic biopsy of left upper inner quadrant  calcification.  The pathology showed papillary lesion with atypia.  Calcifications are present.  No definitive evidence of invasive carcinoma in these biopsies.  -03/01/2021, patient underwent left breast lumpectomy by Dr. Peyton Najjar. 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 Margins are negative for both invasive carcinoma and DCIS.  pT657m pNx ER+ [ER expression not visualizing the invasive component due to small size and suboptimal tissue sections.]-PR + HER2 negative     03/09/2021 Cancer Staging   Staging form: Breast, AJCC 8th Edition - Pathologic: Stage Unknown (pT176m pNX, cM0, G1, ER+, PR+, HER2-) - Signed by YuEarlie ServerMD on 06/10/2021 Stage prefix: Initial diagnosis Multigene prognostic tests performed: None Histologic grading system: 3 grade system   04/13/2021 - 05/20/2021 Radiation Therapy   adjuvant radiation to the left breast.   07/14/2021 Imaging   DEXA -This patient is considered osteopenic  FRAX* RESULTS  10-year Probability of Fracture1 Major Osteoporotic Fracture2 Hip Fracture 5.7% 1.5%   07/14/2021 - 07/28/2021 Anti-estrogen oral therapy   Arimidex 57m45maily.self stopped due to arthralgia   09/10/2021 -  Anti-estrogen oral therapy   Aromasin 25m39mily.     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  INTERVAL HISTORY LuciDayshia Combs 76 y71. female who has above history reviewed by me today presents for follow up visit for management of left breast DCIS with microinvasive carcinoma. She can not tolerate  Armidex due to arthalgia. She took for about 2 week and stopped.    Review of Systems  Constitutional:  Negative for appetite change, chills, fatigue and fever.  HENT:    Negative for hearing loss and voice change.   Eyes:  Negative for eye problems.  Respiratory:  Negative for chest tightness and cough.   Cardiovascular:  Negative for chest pain.  Gastrointestinal:  Negative for abdominal distention, abdominal pain and blood in stool.  Endocrine: Negative for hot flashes.  Genitourinary:  Negative for difficulty urinating and frequency.   Musculoskeletal:  Positive for arthralgias.  Skin:  Negative for itching and rash.  Neurological:  Negative for extremity weakness.  Hematological:  Negative for adenopathy.  Psychiatric/Behavioral:  Negative for confusion.     MEDICAL HISTORY:  Past Medical History:  Diagnosis Date   Arthritis    Bilateral carotid artery stenosis    CAD (coronary artery disease)    GERD (gastroesophageal reflux disease)    Gout    Heart murmur    History of abnormal Pap smear    Hyperlipidemia    Hypertension    LBBB (left bundle branch block) 01/20/2021   a.) noted on preoperative ECG   NICM (nonischemic cardiomyopathy) (HCC)[place]/16/2023   a.) TTE 02/15/2021: ED 35-401-75%d LV systolic dysfunction with LVH; mild panvalvular regurgitation. b.) Lexiscan 02/15/2021: EF 21%; global LV systolic  dysfunction; no significant ischemia.   SOB (shortness of breath) on exertion     SURGICAL HISTORY: Past Surgical History:  Procedure Laterality Date   APPENDECTOMY     BREAST BIOPSY Left 01/07/2021   stereo bx calcs, coil marker, path pending   BREAST BIOPSY Left 01/07/2021   [place] bx of calcs, heart marker, path pending   BREAST BIOPSY Left 03/01/2021   Procedure: Excisional BREAST BIOPSY WITH NEEDLE LOCALIZATION (bracket);  Surgeon: Herbert Pun, MD;  Location: [facility] ORS;  Service: General;  Laterality: Left;   COLONOSCOPY WITH PROPOFOL N/A 11/23/2015   Procedure: COLONOSCOPY WITH PROPOFOL;  Surgeon: Manya Silvas, MD;  Location: [facility] ENDOSCOPY;  Service: Endoscopy;  Laterality: N/A;   COLONOSCOPY WITH PROPOFOL N/A  02/07/2017   Procedure: COLONOSCOPY WITH PROPOFOL;  Surgeon: [name], MD;  Location: [facility] ENDOSCOPY;  Service: Endoscopy;  Laterality: N/A;   ESOPHAGOGASTRODUODENOSCOPY (EGD) WITH PROPOFOL N/A 11/23/2015   Procedure: ESOPHAGOGASTRODUODENOSCOPY (EGD) WITH PROPOFOL;  Surgeon: Manya Silvas, MD;  Location: [facility] ENDOSCOPY;  Service: Endoscopy;  Laterality: N/A;   TUBAL LIGATION     WISDOM TOOTH EXTRACTION     x 2    SOCIAL HISTORY: Social History   Socioeconomic History   Marital status: Widowed    Spouse name: Not on file   Number of children: Not on file   Years of education: Not on file   Highest education level: Not on file  Occupational History   Not on file  Tobacco Use   Smoking status: Former    Packs/day: 0.75    Years: 40.00    Total pack years: 30.00    Types: Cigarettes    Quit date: 02/28/2021    Years since quitting: 0.5   Smokeless tobacco: Never  Vaping Use   Vaping Use: Never used  Substance and Sexual Activity   Alcohol use: No   Drug use: No   Sexual activity: Not Currently    Birth control/protection: Post-menopausal    Comment: widow since 2000  Other Topics Concern   Not on file  Social History Narrative   Lives with daughter   Social Determinants of Health   Financial Resource Strain: Not on file  Food Insecurity: Not on file  Transportation Needs: Not on file  Physical Activity: Not on file  Stress: Not on file  Social Connections: Not on file  Intimate Partner Violence: Not on file    FAMILY HISTORY: Family History  Problem Relation Age of Onset   Arthritis Mother    Hyperlipidemia Mother    Hypertension Mother    Hyperlipidemia Sister    Hypertension Sister    Hypertension Daughter    Diabetes Sister    Hyperlipidemia Sister    Hypertension Sister    Hypertension Daughter    Hypertension Daughter    Breast cancer Neg Hx     ALLERGIES:  is allergic to augmentin [amoxicillin-pot clavulanate] and zyrtec [cetirizine  hcl].  MEDICATIONS:  Current Outpatient Medications  Medication Sig Dispense Refill   allopurinol (ZYLOPRIM) 100 MG tablet Take 100 mg by mouth daily.     amLODipine (NORVASC) 5 MG tablet Take 7.5 mg by mouth daily.     aspirin 81 MG tablet Take 81 mg by mouth daily.     atorvastatin (LIPITOR) 40 MG tablet Take 40 mg by mouth daily.     carvedilol (COREG) 6.25 MG tablet Take 6.25 mg by mouth 2 (two) times daily.     cyclobenzaprine (FLEXERIL) 5 MG tablet  Take 5 mg by mouth 2 (two) times daily as needed for muscle spasms.     exemestane (AROMASIN) 25 MG tablet Take 1 tablet (25 mg total) by mouth daily after breakfast. 14 tablet 4   fluticasone (FLONASE) 50 MCG/ACT nasal spray Place 1 spray into both nostrils daily.     omeprazole (PRILOSEC) 40 MG capsule Take 40 mg by mouth 2 (two) times daily.     sacubitril-valsartan (ENTRESTO) 24-26 MG Take 1 tablet by mouth 2 (two) times daily.     spironolactone (ALDACTONE) 25 MG tablet Take 25 mg by mouth daily.     naproxen (NAPROSYN) 500 MG tablet Take 500 mg by mouth 2 (two) times daily as needed for pain. (Patient not taking: Reported on 09/10/2021)     No current facility-administered medications for this visit.     PHYSICAL EXAMINATION: ECOG PERFORMANCE STATUS: 1 - Symptomatic but completely ambulatory Vitals:   09/10/21 1032  BP: (!) 150/67  Pulse: 64  Resp: 18  Temp: (!) 97.2 F (36.2 C)   Filed Weights   09/10/21 1032  Weight: 190 lb (86.2 kg)    Physical Exam Constitutional:      General: She is not in acute distress. HENT:     Head: Normocephalic and atraumatic.  Eyes:     General: No scleral icterus. Cardiovascular:     Rate and Rhythm: Normal rate and regular rhythm.     Heart sounds: Normal heart sounds.  Pulmonary:     Effort: Pulmonary effort is normal. No respiratory distress.     Breath sounds: No wheezing.  Abdominal:     General: Bowel sounds are normal. There is no distension.     Palpations: Abdomen is  soft.  Musculoskeletal:        General: No deformity. Normal range of motion.     Cervical back: Normal range of motion and neck supple.  Skin:    General: Skin is warm and dry.     Findings: No erythema or rash.  Neurological:     Mental Status: She is alert and oriented to person, place, and time. Mental status is at baseline.     Cranial Nerves: No cranial nerve deficit.     Coordination: Coordination normal.  Psychiatric:        Mood and Affect: Mood normal.     LABORATORY DATA:  I have reviewed the data as listed Lab Results  Component Value Date   WBC 6.4 09/10/2021   HGB 12.6 09/10/2021   HCT 40.1 09/10/2021   MCV 88.9 09/10/2021   PLT 223 09/10/2021   Recent Labs    03/09/21 1218 09/10/21 0957  NA 137 139  K 3.7 3.7  CL 102 103  CO2 26 25  GLUCOSE 102* 113*  BUN 11 11  CREATININE 0.82 1.02*  CALCIUM 9.1 9.3  GFRNONAA >60 57*  PROT 7.6 7.2  ALBUMIN 4.4 4.3  AST 18 20  ALT 16 16  ALKPHOS 82 67  BILITOT 0.5 0.7    Iron/TIBC/Ferritin/ %Sat No results found for: "IRON", "TIBC", "FERRITIN", "IRONPCTSAT"    RADIOGRAPHIC STUDIES: I have personally reviewed the radiological images as listed and agreed with the findings in the report. DG Bone Density  Result Date: 07/14/2021 EXAM: DUAL X-RAY ABSORPTIOMETRY (DXA) FOR BONE MINERAL DENSITY IMPRESSION: Your patient Brenda Combs completed a BMD test on 07/14/2021 using the [place] (software version: 14.10) manufactured by [employer]. The following summarizes the results of our  evaluation. Technologist: SCE PATIENT BIOGRAPHICAL: Name: Brenda Combs, Brenda Combs Patient ID: [id number] Birth Date: 1945/05/21 Height: 60.5 in. Gender: Female Exam Date: 07/14/2021 Weight: 185.9 lbs. Indications: Advanced Age, Height Loss, High Risk Meds, History of Breast Cancer, History of Radiation, Postmenopausal Fractures: Treatments: Arimidex, ASPRIN 81 MG, calcium w/ vit D, Prilosec, Vit D DENSITOMETRY RESULTS: Site          Region     Measured Date Measured Age WHO Classification Young Adult T-score BMD         %Change vs. Previous Significant Change (*) AP Spine L1-L3 07/14/2022 75.0 Osteopenia -1.3 1.027 g/cm2 -10.3% Yes AP Spine L1-L3 07/30/2012 66.1 Normal -0.3 1.145 g/cm2 - - DualFemur Neck Right 07/14/2021 75.0 Osteopenia -2.1 0.744 g/cm2 -12.2% Yes DualFemur Neck Right 07/30/2012 66.1 Osteopenia -1.4 0.847 g/cm2 - - DualFemur Total Mean 07/14/2021 75.0 Osteopenia -1.6 0.812 g/cm2 -14.0% Yes DualFemur Total Mean 07/30/2012 66.1 Normal -0.5 0.944 g/cm2 - - Left Forearm Radius 33% 07/14/2021 75.0 Normal 0.2 0.894 g/cm2 -9.1% Yes Left Forearm Radius 33% 07/30/2012 66.1 Normal 1.2 0.984 g/cm2 - - ASSESSMENT: The BMD measured at Femur Neck Right is 0.744 g/cm2 with a T-score of -2.1. This patient is considered osteopenic according to [facility]) criteria. The scan quality is good. Compared with prior study, there has been a significant decrease in the spine. Compared with prior study, there has been a significant decrease in the total hip. L4 was excluded due to degenerative changes. World Pharmacologist [facility]) criteria for post-menopausal, Caucasian Women: Normal:                   T-score at or above -1 SD Osteopenia/low bone mass: T-score between -1 and -2.5 SD Osteoporosis:             T-score at or below -2.5 SD RECOMMENDATIONS: 1. All patients should optimize calcium and vitamin D intake. 2. Consider FDA-approved medical therapies in postmenopausal women and men aged 38 years and older, based on the following: a. A hip or vertebral(clinical or morphometric) fracture b. T-score < -2.5 at the femoral neck or spine after appropriate evaluation to exclude secondary causes c. Low bone mass (T-score between -1.0 and -2.5 at the femoral neck or spine) and a 10-year probability of a hip fracture > 3% or a 10-year probability of a major osteoporosis-related fracture > 20% based on the [place]-adapted WHO  algorithm 3. Clinician judgment and/or patient preferences may indicate treatment for people with 10-year fracture probabilities above or below these levels FOLLOW-UP: People with diagnosed cases of osteoporosis or at high risk for fracture should have regular bone mineral density tests. For patients eligible for Medicare, routine testing is allowed once every 2 years. The testing frequency can be increased to one year for patients who have rapidly progressing disease, those who are receiving or discontinuing medical therapy to restore bone mass, or have additional risk factors. I have reviewed this report, and agree with the above findings. [facility] Dear Earlie Server, Your patient Brenda Combs completed a FRAX assessment on 07/14/2021 using the [place] (analysis version: 14.10) manufactured by [employer]. The following summarizes the results of our evaluation. PATIENT BIOGRAPHICAL: Name: Brenda Combs, Brenda Combs Patient ID: [id number] Birth Date: 01/07/1946 Height:    60.5 in. Gender:     [age]    [age]       Weight:    185.9 lbs. Ethnicity:  Black  Exam Date: 07/14/2021 FRAX* RESULTS:  (version: 3.5) 10-year Probability of Fracture1 Major Osteoporotic Fracture2 Hip Fracture 5.7% 1.5% Population: [place] (Black) Risk Factors: None Based on Femur (Right) Neck BMD 1 -The 10-year probability of fracture may be lower than reported if the patient has received treatment. 2 -Major Osteoporotic Fracture: Clinical Spine, Forearm, Hip or Shoulder *FRAX is a Materials engineer of the [employer] for Metabolic Bone Disease, a [place] (WHO) [facility]. ASSESSMENT: The probability of a major osteoporotic fracture is 5.7% within the next ten years. The probability of a hip fracture is 1.5% within the next ten years. . Electronically Signed   By: Ammie Ferrier M.D.   On: 07/14/2021 08:41

## 2021-09-11 NOTE — Assessment & Plan Note (Addendum)
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 Additional sentinel lymph node biopsy was omitted due to patient's age. Not able to Arimidex, discussed about switching to Aromasin. She is interested to try and will update me.  Rx sent.   Obtain bilateral diagnostic mammogram in Nov 2023.

## 2021-10-20 IMAGING — CT CT CHEST LUNG CANCER SCREENING LOW DOSE W/O CM
2 of 5 series · 15 of 40 positions shown, 18 images · non-contrast
Comparison: 11/05/2018

CLINICAL DATA: Thirty-one pack-year smoking history. Current
smoker.

EXAM:
CT CHEST WITHOUT CONTRAST LOW-DOSE FOR LUNG CANCER SCREENING
TECHNIQUE: Multidetector CT imaging of the chest was performed following the
standard protocol without IV contrast.

[Series 3: lung 1.00 · axial · 0.57mm/px · z∈[-1156,-894]mm · 12 of 290 slices shown, 15 images]
[im 14/290  mediastinal]
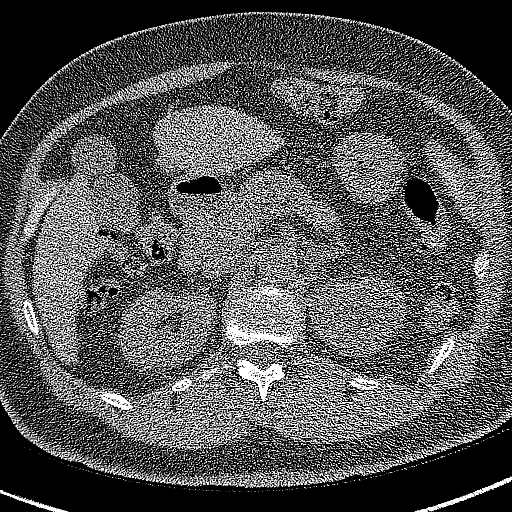
[im 14/290  lung]
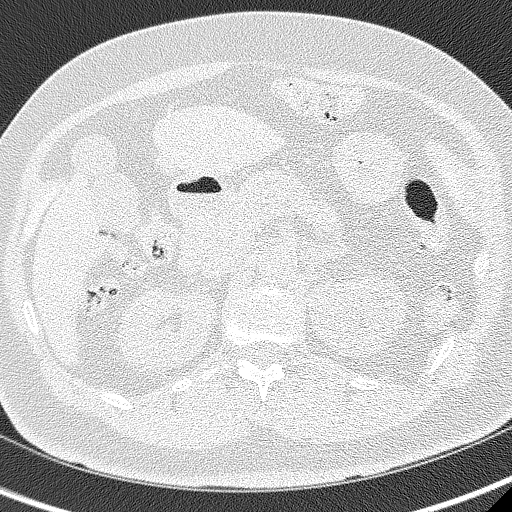
[im 40/290  lung]
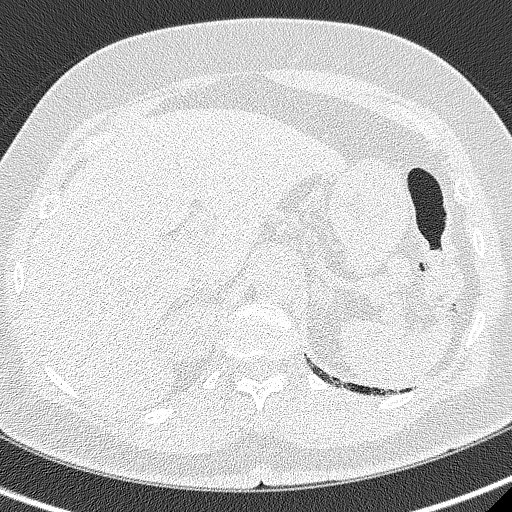
[im 66/290  lung]
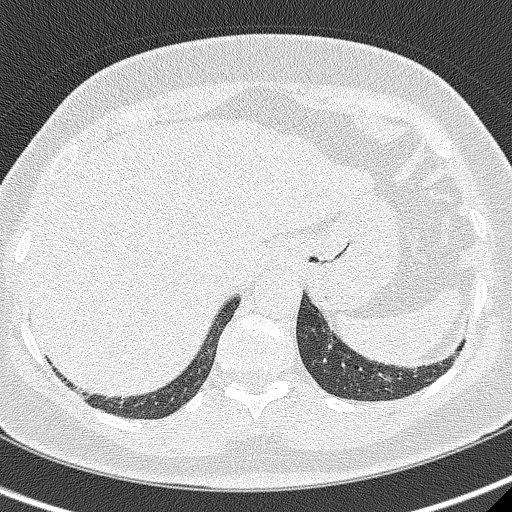
[im 92/290  lung]
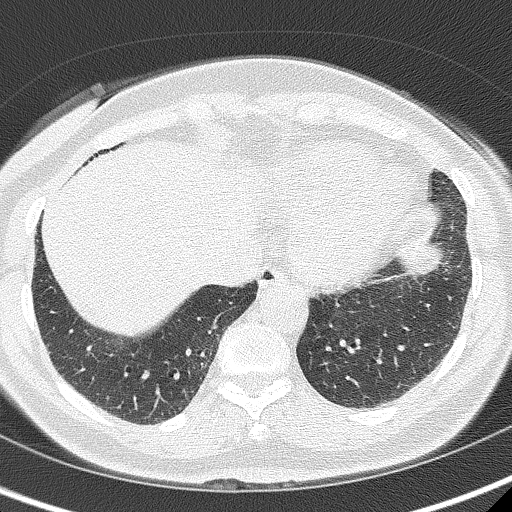
[im 106/290  mediastinal]
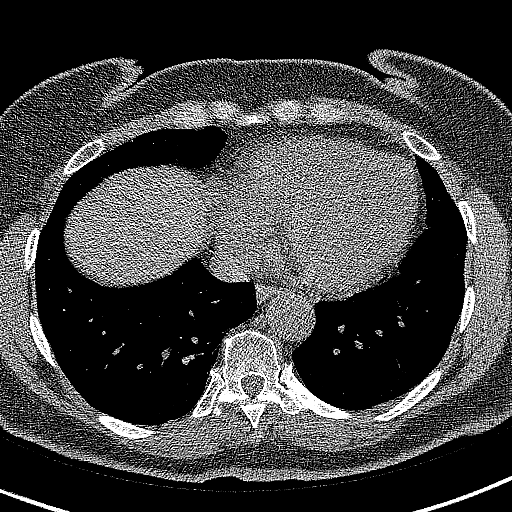
[im 106/290  lung]
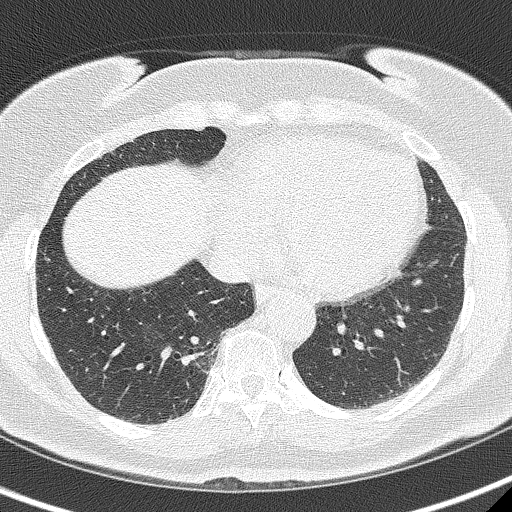
[im 132/290  lung]
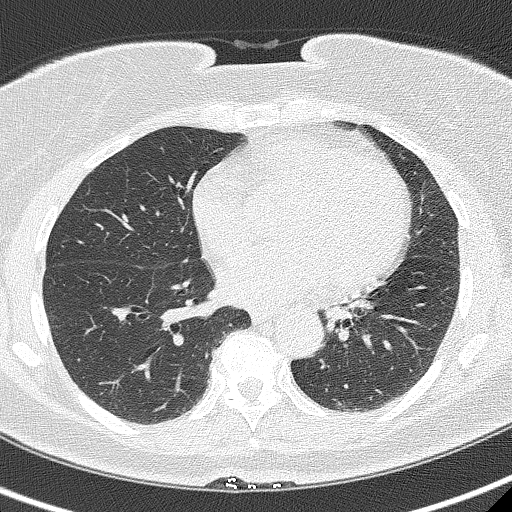
[im 158/290  lung]
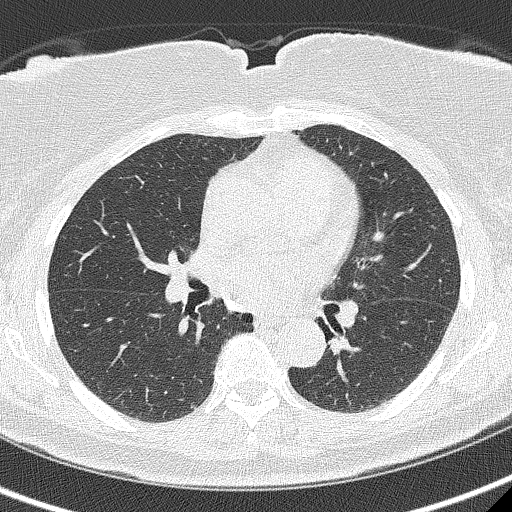
[im 184/290  lung]
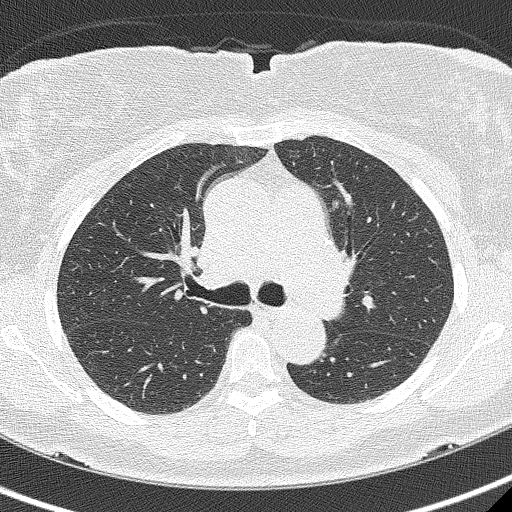
[im 198/290  mediastinal]
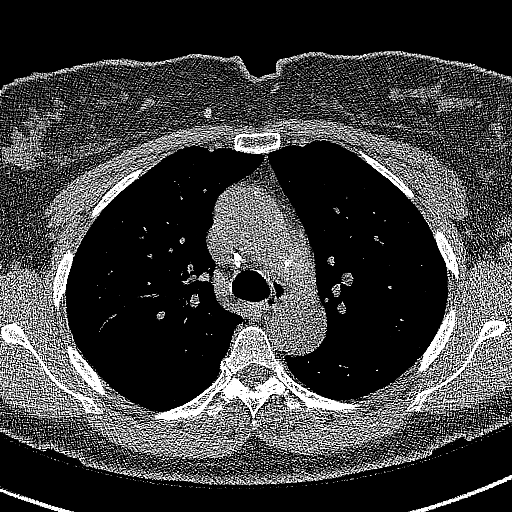
[im 198/290  lung]
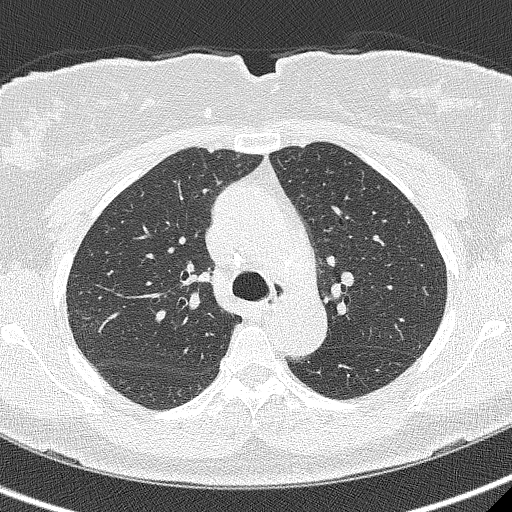
[im 224/290  lung]
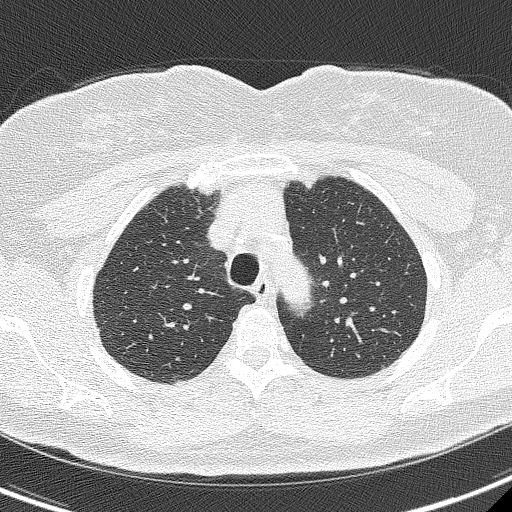
[im 250/290  lung]
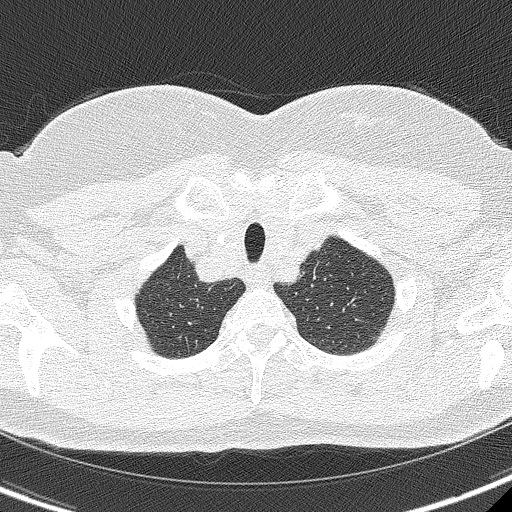
[im 276/290  lung]
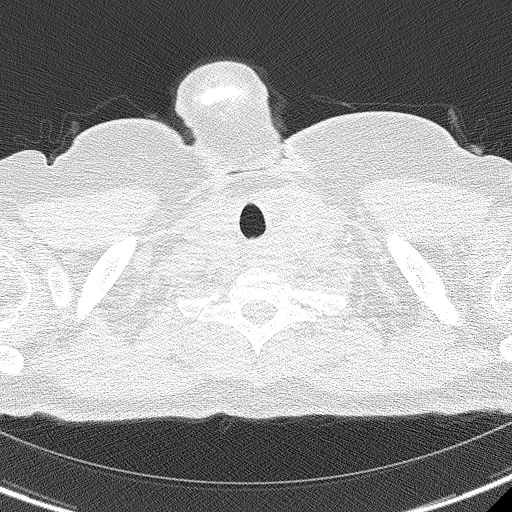

[Series 4: coronals lung 1.00 cor · coronal · 0.57mm/px · 3 of 259 slices shown]
[im 52/259  lung]
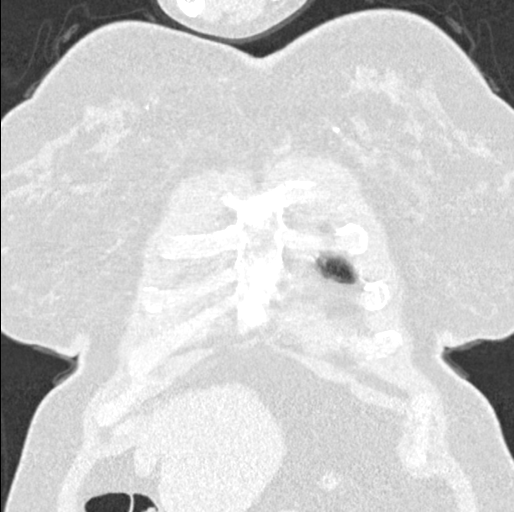
[im 104/259  lung]
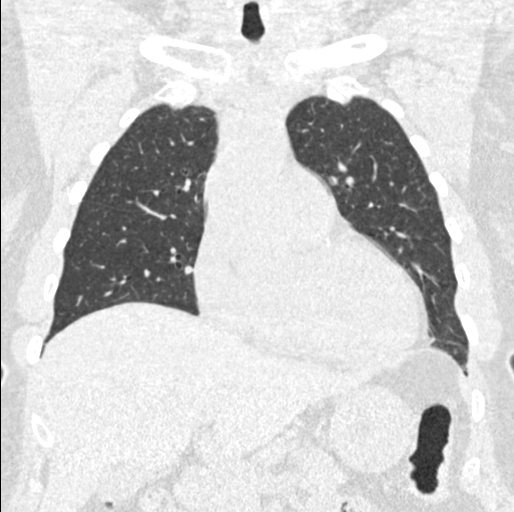
[im 155/259  lung]
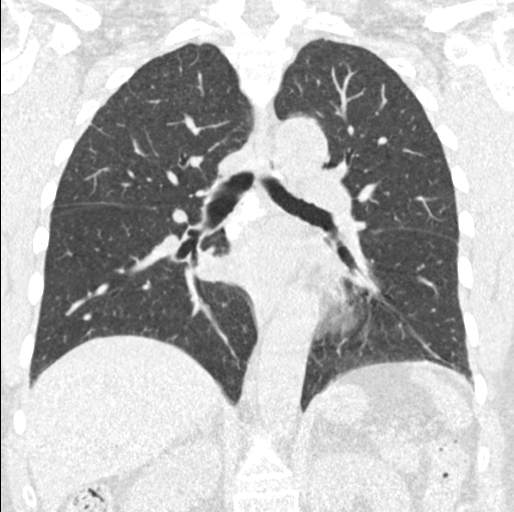

[15 of 40 positions shown; findings below may reference images not displayed]

FINDINGS: Cardiovascular: Aortic atherosclerosis. Tortuous thoracic aorta.
Mild cardiomegaly, without pericardial effusion. Lad coronary artery
calcification. Pulmonary artery enlargement, outflow tract 3.6 cm

Mediastinum/Nodes: Chronic thyroid enlargement. This has been
evaluated on previous imaging. (ref: [HOSPITAL]. [DATE]): 143-50).Calcified mediastinal nodes are likely related to
old granulomatous disease. No mediastinal or definite hilar
adenopathy, given limitations of unenhanced CT. a

Subtle fluid level in the esophagus on [DATE].

Lungs/Pleura: No pleural fluid. Mild centrilobular emphysema.
Right-sided pulmonary nodules of maximally volume derived equivalent
diameter 4.8 mm are not significantly changed.

Upper Abdomen: Normal imaged portions of the liver, spleen,
pancreas, gallbladder, adrenal glands, kidneys. Proximal gastric
underdistention. Chronic apparent wall thickening including at
cm on 46/2 is least partially secondary. Abdominal aortic
atherosclerosis.

Musculoskeletal: No acute osseous abnormality.
IMPRESSION: 1. Lung-RADS 2, benign appearance or behavior. Continue annual
screening with low-dose chest CT without contrast in 12 months.
2. Pulmonary artery enlargement suggests pulmonary arterial
hypertension.
3. Esophageal air fluid level suggests dysmotility or
gastroesophageal reflux.
4. Aortic Atherosclerosis (6PQ3D-OS3.3) and Emphysema (6PQ3D-H3Y.L).
Coronary artery atherosclerosis.
5. Gastric underdistention. Possible concurrent chronic gastric wall
thickening. Correlate with symptoms of gastritis.

## 2021-10-21 ENCOUNTER — Inpatient Hospital Stay: Payer: Medicare PPO | Attending: Oncology

## 2021-10-21 DIAGNOSIS — Z923 Personal history of irradiation: Secondary | ICD-10-CM | POA: Diagnosis not present

## 2021-10-21 DIAGNOSIS — Z79811 Long term (current) use of aromatase inhibitors: Secondary | ICD-10-CM | POA: Insufficient documentation

## 2021-10-21 DIAGNOSIS — C50212 Malignant neoplasm of upper-inner quadrant of left female breast: Secondary | ICD-10-CM | POA: Diagnosis present

## 2021-10-21 DIAGNOSIS — Z170 Estrogen receptor positive status [ER+]: Secondary | ICD-10-CM | POA: Insufficient documentation

## 2021-10-21 LAB — BASIC METABOLIC PANEL
Anion gap: 7 (ref 5–15)
BUN: 12 mg/dL (ref 8–23)
CO2: 26 mmol/L (ref 22–32)
Calcium: 9.4 mg/dL (ref 8.9–10.3)
Chloride: 107 mmol/L (ref 98–111)
Creatinine, Ser: 0.79 mg/dL (ref 0.44–1.00)
GFR, Estimated: >60 mL/min (ref >60)
Glucose, Bld: 103 mg/dL — ABNORMAL HIGH (ref 70–99)
Potassium: 4 mmol/L (ref 3.5–5.1)
Sodium: 140 mmol/L (ref 135–145)

## 2021-10-22 ENCOUNTER — Other Ambulatory Visit: Payer: Medicare PPO

## 2021-10-22 IMAGING — MG MM DIGITAL DIAGNOSTIC UNILAT*R* W/ TOMO W/ CAD
4 series · 4 of 12 positions shown · non-contrast
Comparison: Previous exam(s).

CLINICAL DATA: Patient was called back for a right breast asymmetry

EXAM:
DIGITAL DIAGNOSTIC UNILATERAL RIGHT MAMMOGRAM WITH TOMO AND CAD

[R ML synth-2D]
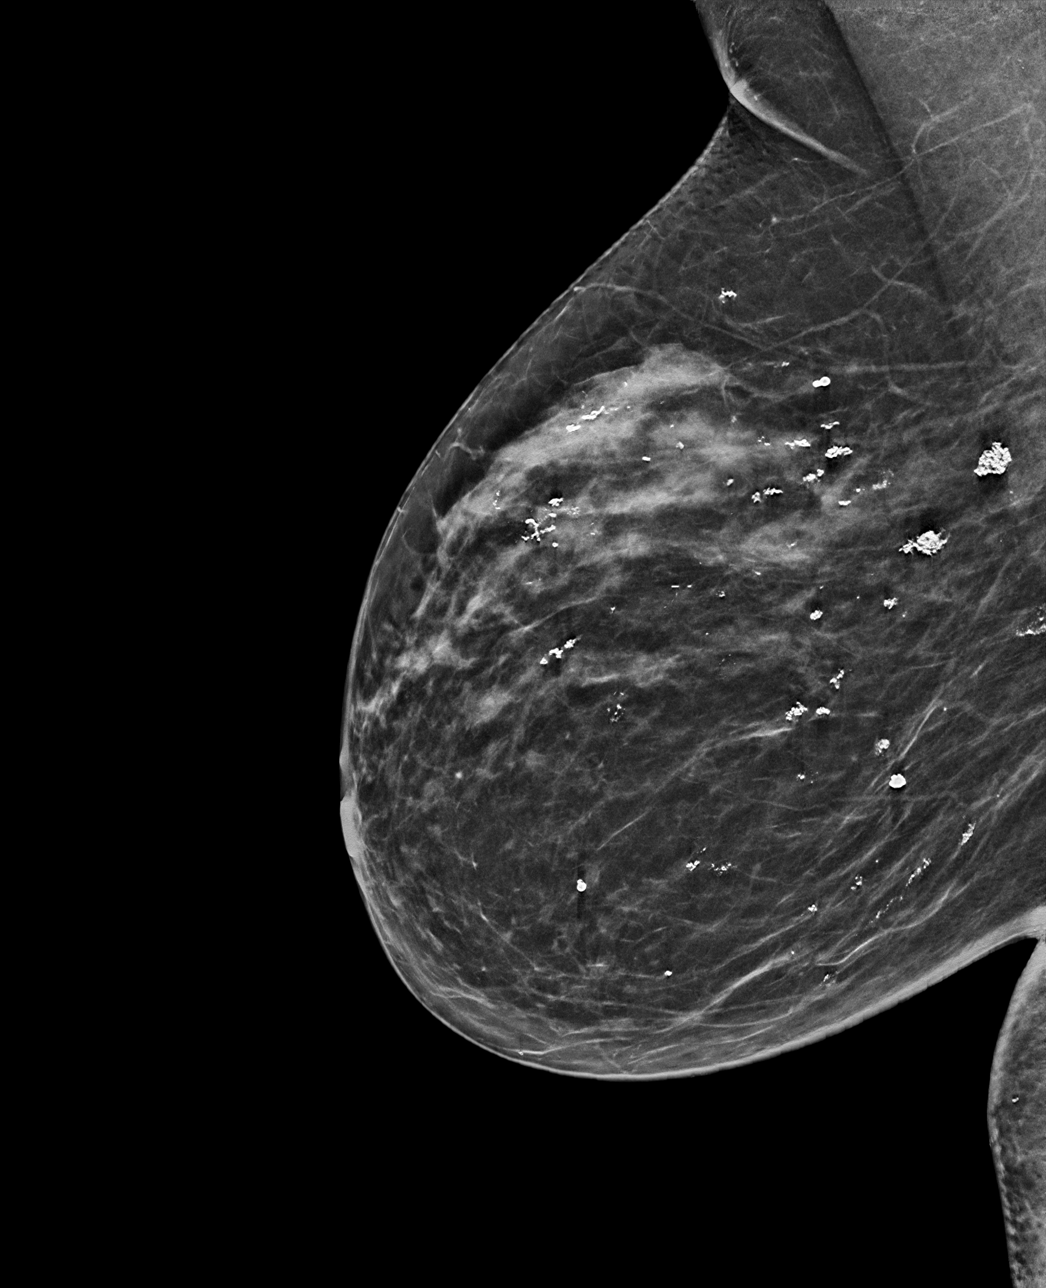

[R CC synth-2D]
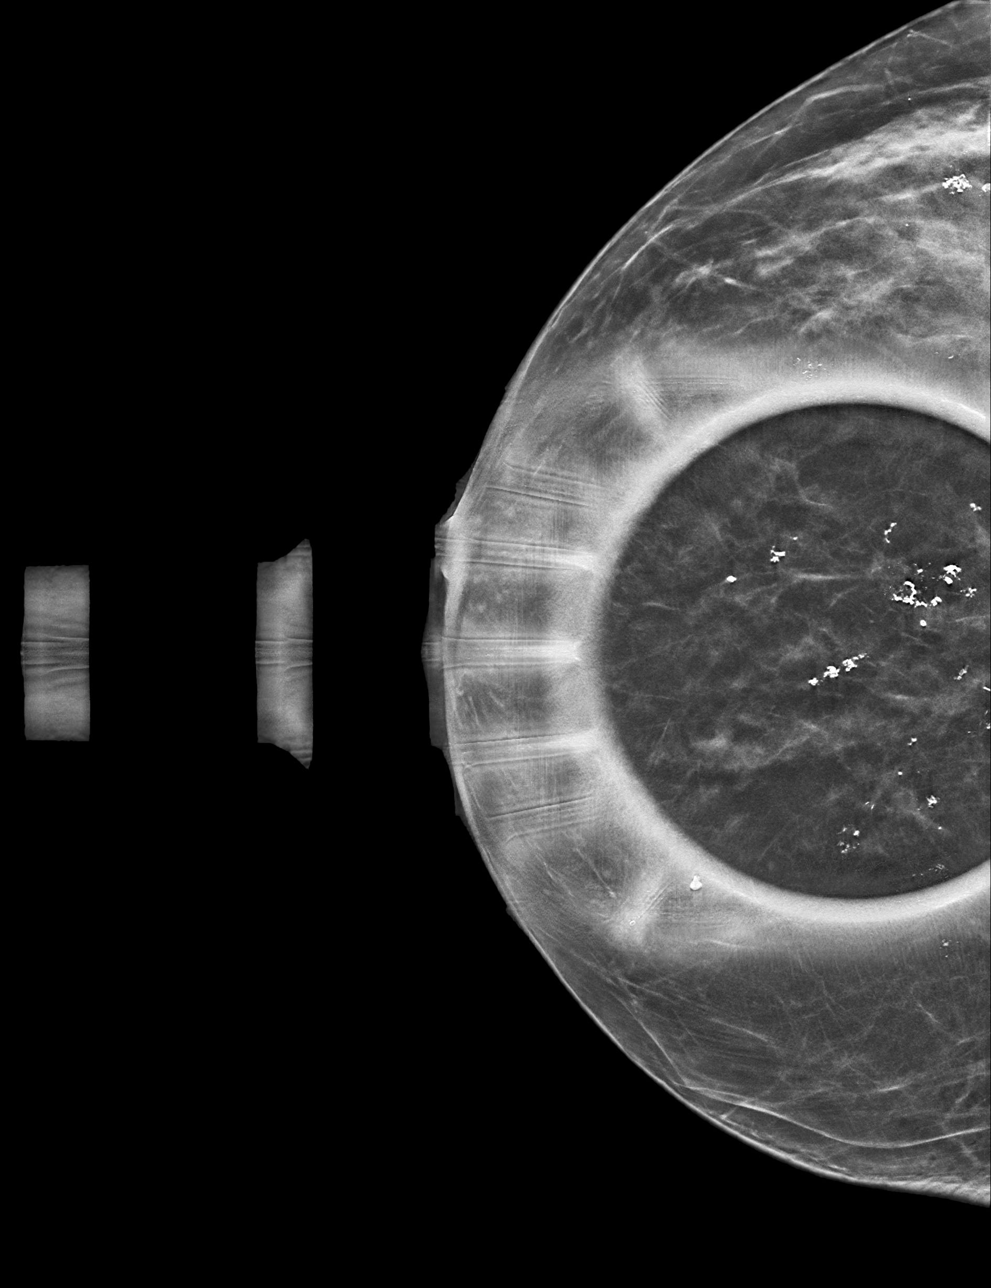

[R ML tomo · tomo slice 29/58.0]
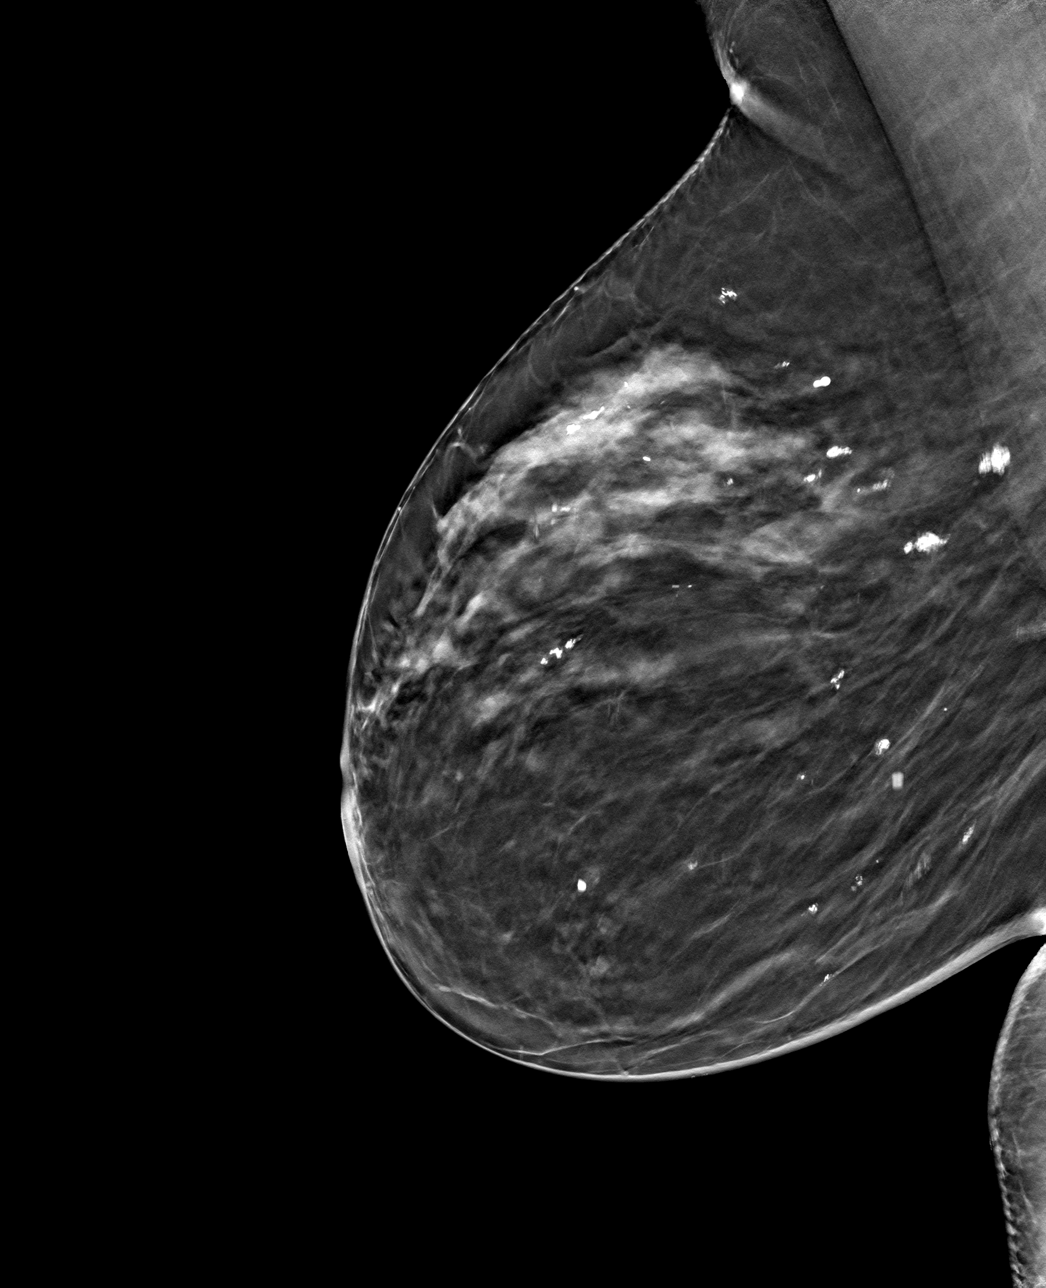

[R CC tomo · tomo slice 24/47.0]
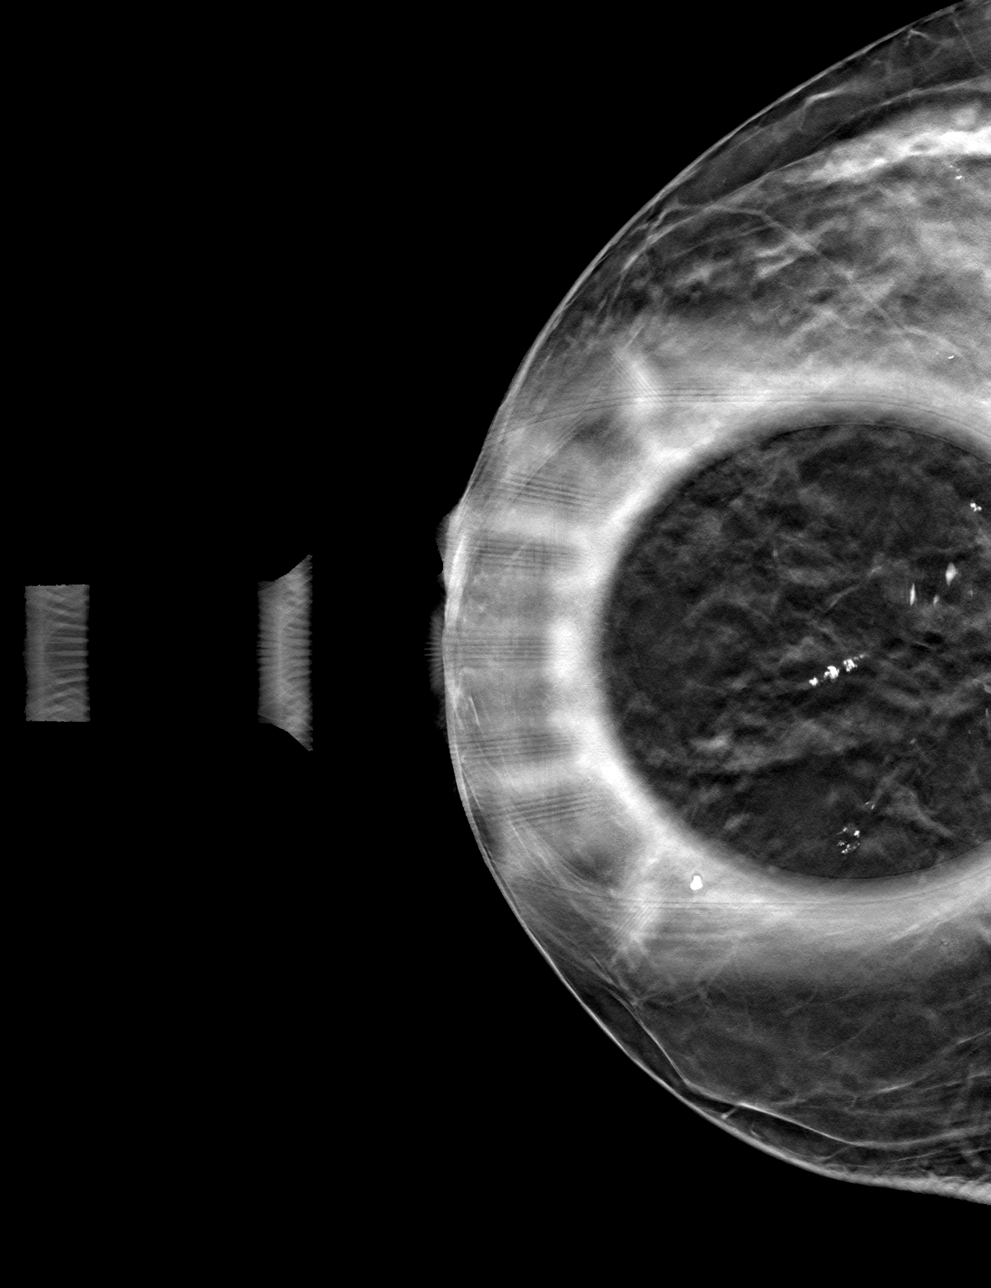

[4 of 12 positions shown; findings below may reference images not displayed]

ACR Breast Density Category c: The breast tissue is heterogeneously
dense, which may obscure small masses.
FINDINGS: Right breast asymmetry resolves on today's imaging.

Mammographic images were processed with CAD.
IMPRESSION: No mammographic evidence of malignancy.

RECOMMENDATION:
Annual screening mammography.

I have discussed the findings and recommendations with the patient.
If applicable, a reminder letter will be sent to the patient
regarding the next appointment.

BI-RADS CATEGORY  1: Negative.

## 2021-12-10 ENCOUNTER — Ambulatory Visit
Admission: RE | Admit: 2021-12-10 | Discharge: 2021-12-10 | Disposition: A | Discharge status: Home / Self Care | Payer: Medicare PPO | Source: Ambulatory Visit | Attending: Oncology | Admitting: Oncology

## 2021-12-10 ENCOUNTER — Ambulatory Visit: Payer: Medicare PPO

## 2021-12-10 DIAGNOSIS — Z170 Estrogen receptor positive status [ER+]: Secondary | ICD-10-CM | POA: Insufficient documentation

## 2021-12-10 DIAGNOSIS — C50212 Malignant neoplasm of upper-inner quadrant of left female breast: Secondary | ICD-10-CM | POA: Diagnosis present

## 2021-12-15 ENCOUNTER — Inpatient Hospital Stay: Payer: Medicare PPO | Attending: Oncology

## 2021-12-15 ENCOUNTER — Inpatient Hospital Stay (HOSPITAL_BASED_OUTPATIENT_CLINIC_OR_DEPARTMENT_OTHER): Payer: Medicare PPO | Admitting: Oncology

## 2021-12-15 ENCOUNTER — Encounter: Payer: Self-pay | Admitting: Oncology

## 2021-12-15 VITALS — BP 152/79 | HR 60 | Temp 97.6°F | Wt 195.2 lb

## 2021-12-15 DIAGNOSIS — K219 Gastro-esophageal reflux disease without esophagitis: Secondary | ICD-10-CM | POA: Insufficient documentation

## 2021-12-15 DIAGNOSIS — I428 Other cardiomyopathies: Secondary | ICD-10-CM | POA: Diagnosis not present

## 2021-12-15 DIAGNOSIS — C50212 Malignant neoplasm of upper-inner quadrant of left female breast: Secondary | ICD-10-CM

## 2021-12-15 DIAGNOSIS — I10 Essential (primary) hypertension: Secondary | ICD-10-CM | POA: Diagnosis not present

## 2021-12-15 DIAGNOSIS — Z9049 Acquired absence of other specified parts of digestive tract: Secondary | ICD-10-CM | POA: Insufficient documentation

## 2021-12-15 DIAGNOSIS — Z8249 Family history of ischemic heart disease and other diseases of the circulatory system: Secondary | ICD-10-CM | POA: Diagnosis not present

## 2021-12-15 DIAGNOSIS — Z170 Estrogen receptor positive status [ER+]: Secondary | ICD-10-CM | POA: Diagnosis not present

## 2021-12-15 DIAGNOSIS — Z880 Allergy status to penicillin: Secondary | ICD-10-CM | POA: Diagnosis not present

## 2021-12-15 DIAGNOSIS — M8580 Other specified disorders of bone density and structure, unspecified site: Secondary | ICD-10-CM | POA: Diagnosis not present

## 2021-12-15 DIAGNOSIS — I2510 Atherosclerotic heart disease of native coronary artery without angina pectoris: Secondary | ICD-10-CM | POA: Diagnosis not present

## 2021-12-15 DIAGNOSIS — Z79899 Other long term (current) drug therapy: Secondary | ICD-10-CM | POA: Insufficient documentation

## 2021-12-15 DIAGNOSIS — Z8261 Family history of arthritis: Secondary | ICD-10-CM | POA: Insufficient documentation

## 2021-12-15 DIAGNOSIS — Z833 Family history of diabetes mellitus: Secondary | ICD-10-CM | POA: Insufficient documentation

## 2021-12-15 DIAGNOSIS — M109 Gout, unspecified: Secondary | ICD-10-CM | POA: Insufficient documentation

## 2021-12-15 DIAGNOSIS — Z8349 Family history of other endocrine, nutritional and metabolic diseases: Secondary | ICD-10-CM | POA: Insufficient documentation

## 2021-12-15 DIAGNOSIS — Z881 Allergy status to other antibiotic agents status: Secondary | ICD-10-CM | POA: Diagnosis not present

## 2021-12-15 DIAGNOSIS — Z87891 Personal history of nicotine dependence: Secondary | ICD-10-CM | POA: Insufficient documentation

## 2021-12-15 DIAGNOSIS — I447 Left bundle-branch block, unspecified: Secondary | ICD-10-CM | POA: Insufficient documentation

## 2021-12-15 LAB — COMPREHENSIVE METABOLIC PANEL WITH GFR
ALT: 17 U/L (ref 0–44)
AST: 22 U/L (ref 15–41)
Albumin: 4.1 g/dL (ref 3.5–5.0)
Alkaline Phosphatase: 70 U/L (ref 38–126)
Anion gap: 7 (ref 5–15)
BUN: 13 mg/dL (ref 8–23)
CO2: 26 mmol/L (ref 22–32)
Calcium: 9 mg/dL (ref 8.9–10.3)
Chloride: 104 mmol/L (ref 98–111)
Creatinine, Ser: 0.97 mg/dL (ref 0.44–1.00)
GFR, Estimated: >60 mL/min
Glucose, Bld: 111 mg/dL — ABNORMAL HIGH (ref 70–99)
Potassium: 3.5 mmol/L (ref 3.5–5.1)
Sodium: 137 mmol/L (ref 135–145)
Total Bilirubin: 0.7 mg/dL (ref 0.3–1.2)
Total Protein: 7.3 g/dL (ref 6.5–8.1)

## 2021-12-15 LAB — CBC WITH DIFFERENTIAL/PLATELET
Abs Immature Granulocytes: 0.03 10*3/uL (ref 0.00–0.07)
Basophils Absolute: 0 10*3/uL (ref 0.0–0.1)
Basophils Relative: 1 %
Eosinophils Absolute: 0.1 10*3/uL (ref 0.0–0.5)
Eosinophils Relative: 1 %
HCT: 39.6 % (ref 36.0–46.0)
Hemoglobin: 12.3 g/dL (ref 12.0–15.0)
Immature Granulocytes: 1 %
Lymphocytes Relative: 25 %
Lymphs Abs: 1.6 10*3/uL (ref 0.7–4.0)
MCH: 27.9 pg (ref 26.0–34.0)
MCHC: 31.1 g/dL (ref 30.0–36.0)
MCV: 89.8 fL (ref 80.0–100.0)
Monocytes Absolute: 0.6 10*3/uL (ref 0.1–1.0)
Monocytes Relative: 10 %
Neutro Abs: 4.1 10*3/uL (ref 1.7–7.7)
Neutrophils Relative %: 62 %
Platelets: 196 10*3/uL (ref 150–400)
RBC: 4.41 MIL/uL (ref 3.87–5.11)
RDW: 13.3 % (ref 11.5–15.5)
WBC: 6.5 10*3/uL (ref 4.0–10.5)
nRBC: 0 % (ref 0.0–0.2)

## 2021-12-15 NOTE — Assessment & Plan Note (Signed)
Recommend calcium and vitamin D supplementation.  

## 2021-12-15 NOTE — Assessment & Plan Note (Addendum)
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 Additional sentinel lymph node biopsy was omitted due to patient's age. Not able to tolerate Arimidex and Aromasin.  She is not interested in switching to other AI's or switching to tamoxifen.   Bilateral diagnostic mammogram in Nov 2023-results were reviewed with patient.  Continue annual mammogram surveillance.

## 2021-12-15 NOTE — Progress Notes (Signed)
Hematology/Oncology Progress note [phone] [phone]            Patient Care Team: Marinda Elk, MD as PCP - General (Physician Assistant) Earlie Server, MD as Consulting Physician (Hematology and Oncology) Noreene Filbert, MD as Consulting Physician (Radiation Oncology) Herbert Pun, MD as Consulting Physician (General Surgery)  ASSESSMENT & PLAN:   Cancer Staging  Malignant neoplasm of upper-inner quadrant of left breast in female, estrogen receptor positive ([place]) Staging form: Breast, AJCC 8th Edition - Pathologic: Stage Unknown (pT62m, pNX, cM0, G1, ER+, PR+, HER2-) - Signed by YEarlie Server MD on 06/10/2021   Malignant neoplasm of upper-inner quadrant of left breast in [place], [place] #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 Additional sentinel lymph node biopsy was omitted due to patient's age. Not able to tolerate Arimidex and Aromasin.  Brenda Combs is not interested in switching to other AI's or switching to tamoxifen.   Bilateral diagnostic mammogram in Nov 2023-results were reviewed with patient.  Continue annual mammogram surveillance.  Osteopenia Recommend calcium and vitamin D supplementation.   Orders Placed This Encounter  Procedures   CBC with Differential/Platelet    Standing Status:   Future    Standing Expiration Date:   12/15/2022   Comprehensive metabolic panel    Standing Status:   Future    Standing Expiration Date:   12/15/2022   Follow up  6 months  lab MD cbc cmp  All questions were answered. The patient knows to call the clinic with any problems, questions or concerns.  ZhEarlie ServerMD, PhD [facility] 12/15/2021   CHIEF COMPLAINTS/REASON FOR VISIT:  left breast cancer  HISTORY OF PRESENTING ILLNESS:   Brenda Combs a  [age].o.  female presents for follow up of  left breast cancer Oncology History  Malignant neoplasm of upper-inner quadrant of left breast in [place], [place] receptor positive [place]   12/18/2020, screening mammogram bilaterally showed possible mass warrants further evaluation.  Right breast has no findings suspicious for malignancy. 12/28/2020, left breast diagnostic mammogram showed 11:00 1.4 cm suspicious mass.  Associated calcifications in linear distribution spanning 4 cm.  There is no evidence of left axillary lymphadenopathy.   01/07/2021 Initial Diagnosis   Malignant neoplasm of upper-inner quadrant of left breast in female, estrogen receptor positive   -Ultrasound-guided left breast 11:00 mass biopsy   Pathology showed detached fragment of papillary lesion with sclerosis and atypia. and stereotactic biopsy of left upper inner quadrant calcification.  The pathology showed papillary lesion with atypia.  Calcifications are present.  No definitive evidence of invasive carcinoma in these biopsies.  -03/01/2021, patient underwent left breast lumpectomy by Dr. 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 Margins are negative for both invasive carcinoma and DCIS.  pT1m29mNx ER+ [ER expression not visualizing the invasive component due to small size and suboptimal tissue sections.]-PR + HER2 negative     03/09/2021 Cancer Staging   Staging form: Breast, AJCC 8th Edition - Pathologic: Stage Unknown (pT1mi40mNX, cM0, G1, ER+, PR+, HER2-) - Signed by Cassadee Vanzandt, Earlie Combs on 06/10/2021 Stage prefix: Initial diagnosis Multigene prognostic tests performed: None Histologic grading system: 3 grade system   04/13/2021 - 05/20/2021 Radiation Therapy   adjuvant radiation to the left breast.   07/14/2021 Imaging   DEXA -This patient is considered  osteopenic  FRAX* RESULTS  10-year  Probability of Fracture1 Major Osteoporotic Fracture2 Hip Fracture 5.7% 1.5%   07/14/2021 - 07/28/2021 Anti-estrogen oral therapy   Arimidex 85m daily.self stopped due to arthralgia   09/10/2021 -  Anti-estrogen oral therapy   Aromasin 257mdaily.  Patient tried and stopped shortly after starting.     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  INTERVAL HISTORY LuJolynne Combs a [age].o. female who has above history reviewed by me today presents for follow up visit for management of left breast DCIS with microinvasive carcinoma. Brenda Combs can not tolerate Aromasin and has stopped.  Chronic arthralgia    Review of Systems  Constitutional:  Negative for appetite change, chills, fatigue and fever.  HENT:   Negative for hearing loss and voice change.   Eyes:  Negative for eye problems.  Respiratory:  Negative for chest tightness and cough.   Cardiovascular:  Negative for chest pain.  Gastrointestinal:  Negative for abdominal distention, abdominal pain and blood in stool.  Endocrine: Negative for hot flashes.  Genitourinary:  Negative for difficulty urinating and frequency.   Musculoskeletal:  Positive for arthralgias.  Skin:  Negative for itching and rash.  Neurological:  Negative for extremity weakness.  Hematological:  Negative for adenopathy.  Psychiatric/Behavioral:  Negative for confusion.     MEDICAL HISTORY:  Past Medical History:  Diagnosis Date   Arthritis    Bilateral carotid artery stenosis    CAD (coronary artery disease)    GERD (gastroesophageal reflux disease)    Gout    Heart murmur    History of abnormal Pap smear    Hyperlipidemia    Hypertension    LBBB (left bundle branch block) 01/20/2021   a.) noted on preoperative ECG   [place]ischemic cardiomyopathy[place]   a.) TTE 02/15/2021: ED 3515-17%mod  LV systolic dysfunction with LVH; mild panvalvular regurgitation. b.) Lexiscan 02/15/2021: EF 21%; global LV systolic dysfunction; no significant ischemia.   SOB (shortness of breath) on exertion     SURGICAL HISTORY: Past Surgical History:  Procedure Laterality Date   APPENDECTOMY     BREAST BIOPSY Left 01/07/2021   stereo bx calcs, coil marker, PAPILLARY LESION WITH ATYPIA- CALCIFICATIONS ARE PRESENT. 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, Microinvasive carcinoma   BREAST BIOPSY Left 01/07/2021   usKoreax of calcs, heart marker, DETACHED FRAGMENTS OF PAPILLARY LESION WITH SCLEROSIS AND ATYPIA. 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, Microinvasive carcinoma   BREAST BIOPSY Left 03/01/2021   Procedure: Excisional BREAST BIOPSY WITH NEEDLE LOCALIZATION (bracket);  Surgeon: CiHerbert PunMD;  Location: [facility] ORS;  Service: General;  Laterality: Left;   BREAST EXCISIONAL BIOPSY Left 2022   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, Microinvasive carcinoma   COLONOSCOPY WITH PROPOFOL N/A 11/23/2015   Procedure: COLONOSCOPY WITH PROPOFOL;  Surgeon: RoManya SilvasMD;  [facility];  Service: Endoscopy;  Laterality: N/A;   COLONOSCOPY WITH PROPOFOL N/A 02/07/2017   Procedure: COLONOSCOPY WITH PROPOFOL;  Surgeon: SkLollie SailsMD;  [facility];  Service: Endoscopy;  Laterality: N/A;   ESOPHAGOGASTRODUODENOSCOPY (EGD) WITH PROPOFOL N/A 11/23/2015   Procedure: ESOPHAGOGASTRODUODENOSCOPY (EGD) WITH PROPOFOL;  Surgeon: RoManya SilvasMD;  [facility];  Service: Endoscopy;  Laterality: N/A;   TUBAL LIGATION     WISDOM TOOTH EXTRACTION     x 2    SOCIAL HISTORY: Social History   Socioeconomic  History   Marital status: Widowed    Spouse name: Not on file   Number of children: Not on file   Years of education: Not on file   Highest education level: Not on file  Occupational History   Not on file  Tobacco Use   Smoking status: Former    Packs/day: 0.75    Years: 40.00    Total  pack years: 30.00    Types: Cigarettes    Quit date: 02/28/2021    Years since quitting: 0.7   Smokeless tobacco: Never  Vaping Use   Vaping Use: Never used  Substance and Sexual Activity   Alcohol use: No   Drug use: No   Sexual activity: Not Currently    Birth control/protection: Post-menopausal    Comment: widow since 2000  Other Topics Concern   Not on file  Social History Narrative   Lives with daughter   Social Determinants of Health   Financial Resource Strain: Not on file  Food Insecurity: Not on file  Transportation Needs: Not on file  Physical Activity: Not on file  Stress: Not on file  Social Connections: Not on file  Intimate Partner Violence: Not on file    FAMILY HISTORY: Family History  Problem Relation Age of Onset   Arthritis Mother    Hyperlipidemia Mother    Hypertension Mother    Hyperlipidemia Sister    Hypertension Sister    Hypertension Daughter    Diabetes Sister    Hyperlipidemia Sister    Hypertension Sister    Hypertension Daughter    Hypertension Daughter    Breast cancer Neg Hx     ALLERGIES:  is allergic to jardiance [empagliflozin], augmentin [amoxicillin-pot clavulanate], and zyrtec [cetirizine hcl].  MEDICATIONS:  Current Outpatient Medications  Medication Sig Dispense Refill   allopurinol (ZYLOPRIM) 100 MG tablet Take 100 mg by mouth daily.     amLODipine (NORVASC) 5 MG tablet Take 7.5 mg by mouth daily.     aspirin 81 MG tablet Take 81 mg by mouth daily.     atorvastatin (LIPITOR) 40 MG tablet Take 40 mg by mouth daily.     carvedilol (COREG) 6.25 MG tablet Take 6.25 mg by mouth 2 (two) times daily.     cyclobenzaprine (FLEXERIL) 5 MG tablet Take 5 mg by mouth 2 (two) times daily as needed for muscle spasms.     fluticasone (FLONASE) 50 MCG/ACT nasal spray Place 1 spray into both nostrils daily.     naproxen (NAPROSYN) 500 MG tablet Take 500 mg by mouth 2 (two) times daily as needed for pain.     omeprazole (PRILOSEC) 40 MG  capsule Take 40 mg by mouth 2 (two) times daily.     sacubitril-valsartan (ENTRESTO) 24-26 MG Take 1 tablet by mouth 2 (two) times daily.     spironolactone (ALDACTONE) 25 MG tablet Take 25 mg by mouth daily.     No current facility-administered medications for this visit.     PHYSICAL EXAMINATION: ECOG PERFORMANCE STATUS: 1 - Symptomatic but completely ambulatory Vitals:   12/15/21 1048  BP: (!) 152/79  Pulse: 60  Temp: 97.6 F (36.4 C)  SpO2: 100%   Filed Weights   12/15/21 1048  Weight: 195 lb 3.2 oz (88.5 kg)    Physical Exam Constitutional:      General: Brenda Combs is not in acute distress. HENT:     Head: Normocephalic and atraumatic.  Eyes:     General: No scleral icterus. Cardiovascular:  Rate and Rhythm: Normal rate and regular rhythm.     Heart sounds: Normal heart sounds.  Pulmonary:     Effort: Pulmonary effort is normal. No respiratory distress.     Breath sounds: No wheezing.  Abdominal:     General: Bowel sounds are normal. There is no distension.     Palpations: Abdomen is soft.  Musculoskeletal:        General: No deformity. Normal range of motion.     Cervical back: Normal range of motion and neck supple.  Skin:    General: Skin is warm and dry.     Findings: No erythema or rash.  Neurological:     Mental Status: Brenda Combs is alert and oriented to person, place, and time. Mental status is at baseline.     Cranial Nerves: No cranial nerve deficit.     Coordination: Coordination normal.  Psychiatric:        Mood and Affect: Mood normal.     LABORATORY DATA:  I have reviewed the data as listed Lab Results  Component Value Date   WBC 6.5 12/15/2021   HGB 12.3 12/15/2021   HCT 39.6 12/15/2021   MCV 89.8 12/15/2021   PLT 196 12/15/2021   Recent Labs    03/09/21 1218 09/10/21 0957 10/21/21 0901 12/15/21 1002  NA 137 139 140 137  K 3.7 3.7 4.0 3.5  CL 102 103 107 104  CO2 _0 GLUCOSE 102* 113* 103* 111*  BUN _1 CREATININE  0.82 1.02* 0.79 0.97  CALCIUM 9.1 9.3 9.4 9.0  GFRNONAA >60 57* >60 >60  PROT 7.6 7.2  --  7.3  ALBUMIN 4.4 4.3  --  4.1  AST 18 20  --  22  ALT 16 16  --  17  ALKPHOS 82 67  --  70  BILITOT 0.5 0.7  --  0.7    Iron/TIBC/Ferritin/ %Sat No results found for: "IRON", "TIBC", "FERRITIN", "IRONPCTSAT"    RADIOGRAPHIC STUDIES: I have personally reviewed the radiological images as listed and agreed with the findings in the report. MM DIAG BREAST TOMO BILATERAL  Result Date: 12/10/2021 CLINICAL DATA:  Patient with history left breast lumpectomy 2023. EXAM: DIGITAL DIAGNOSTIC BILATERAL MAMMOGRAM WITH TOMOSYNTHESIS TECHNIQUE: Bilateral digital diagnostic mammography and breast tomosynthesis was performed. COMPARISON:  Previous exam(s). ACR Breast Density Category c: The breast tissue is heterogeneously dense, which may obscure small masses. FINDINGS: Interval postlumpectomy changes left breast. No new masses, calcifications or nonsurgical distortion identified within either breast. IMPRESSION: No mammographic evidence for malignancy. Interval postlumpectomy changes left breast. RECOMMENDATION: Bilateral diagnostic mammography in 1 year. I have discussed the findings and recommendations with the patient. If applicable, a reminder letter will be sent to the patient regarding the next appointment. BI-RADS CATEGORY  2: Benign. Electronically Signed   By: Lovey Newcomer M.D.   On: 12/10/2021 10:14

## 2022-01-03 ENCOUNTER — Encounter: Payer: Self-pay | Admitting: Radiation Oncology

## 2022-01-03 ENCOUNTER — Ambulatory Visit
Admission: RE | Admit: 2022-01-03 | Discharge: 2022-01-03 | Disposition: A | Discharge status: Home / Self Care | Payer: Medicare PPO | Source: Ambulatory Visit | Attending: Radiation Oncology | Admitting: Radiation Oncology

## 2022-01-03 VITALS — BP 137/60 | HR 61 | Temp 97.2°F | Resp 16 | Wt 197.7 lb

## 2022-01-03 DIAGNOSIS — C50212 Malignant neoplasm of upper-inner quadrant of left female breast: Secondary | ICD-10-CM | POA: Diagnosis present

## 2022-01-03 DIAGNOSIS — Z170 Estrogen receptor positive status [ER+]: Secondary | ICD-10-CM | POA: Diagnosis not present

## 2022-01-03 DIAGNOSIS — Z923 Personal history of irradiation: Secondary | ICD-10-CM | POA: Diagnosis not present

## 2022-01-03 NOTE — Progress Notes (Signed)
Radiation Oncology Follow up Note  Name: Brenda Combs   Date:   01/03/2022 MRN:  [id number] DOB: Mar 15, 1945    This 76 y.o. female presents to the clinic today for 6-monthfollow-up status post whole breast radiation to her left breast for stage Ia ER/PR positive microinvasive carcinoma  REFERRING PROVIDER: MMarinda Elk MD  HPI: Patient is a 76year old female now out 6 months having completed whole breast radiation to her left breast for stage Ia ER/PR positive microinvasive carcinoma seen today in routine follow-up she is doing well.  She specifically denies breast tenderness cough or bone pain..  She had recent mammograms which I have reviewed were BI-RADS 2 benign.  She is not on endocrine therapy based on side effect profile.  COMPLICATIONS OF TREATMENT: none  FOLLOW UP COMPLIANCE: keeps appointments   PHYSICAL EXAM:  BP 137/60   Pulse 61   Temp (!) 97.2 F (36.2 C)   Resp 16   Wt 197 lb 11.2 oz (89.7 kg)   BMI 36.16 kg/m  Lungs are clear to A&P cardiac examination essentially unremarkable with regular rate and rhythm. No dominant mass or nodularity is noted in either breast in 2 positions examined. Incision is well-healed. No axillary or supraclavicular adenopathy is appreciated. Cosmetic result is excellent.  Well-developed well-nourished patient in NAD. HEENT reveals PERLA, EOMI, discs not visualized.  Oral cavity is clear. No oral mucosal lesions are identified. Neck is clear without evidence of cervical or supraclavicular adenopathy. Lungs are clear to A&P. Cardiac examination is essentially unremarkable with regular rate and rhythm without murmur rub or thrill. Abdomen is benign with no organomegaly or masses noted. Motor sensory and DTR levels are equal and symmetric in the upper and lower extremities. Cranial nerves II through XII are grossly intact. Proprioception is intact. No peripheral adenopathy or edema is identified. No motor or sensory levels are noted. Crude  visual fields are within normal range.  RADIOLOGY RESULTS: Mammograms reviewed compatible with above-stated findings  PLAN: At the present time patient is doing well with no evidence of disease now 6 months out from adjuvant radiation therapy.  And pleased with her overall progress.  Of asked to see her back in 6 months for follow-up.  Patient knows to call with any concerns.  I would like to take this opportunity to thank you for allowing me to participate in the care of your patient..Noreene Filbert MD

## 2022-01-26 ENCOUNTER — Other Ambulatory Visit: Payer: Self-pay | Admitting: Family Medicine

## 2022-01-26 DIAGNOSIS — M5416 Radiculopathy, lumbar region: Secondary | ICD-10-CM

## 2022-02-11 ENCOUNTER — Other Ambulatory Visit: Payer: Medicare PPO

## 2022-02-11 ENCOUNTER — Ambulatory Visit
Admission: RE | Admit: 2022-02-11 | Discharge: 2022-02-11 | Disposition: A | Discharge status: Home / Self Care | Payer: Medicare PPO | Source: Ambulatory Visit | Attending: Family Medicine | Admitting: Family Medicine

## 2022-02-11 DIAGNOSIS — M5416 Radiculopathy, lumbar region: Secondary | ICD-10-CM

## 2022-06-15 ENCOUNTER — Encounter: Payer: Self-pay | Admitting: Oncology

## 2022-06-15 ENCOUNTER — Inpatient Hospital Stay: Payer: Medicare PPO | Attending: Radiation Oncology

## 2022-06-15 ENCOUNTER — Inpatient Hospital Stay: Payer: Medicare PPO | Admitting: Oncology

## 2022-06-15 VITALS — BP 160/77 | HR 72 | Temp 96.4°F | Resp 18 | Wt 197.7 lb

## 2022-06-15 DIAGNOSIS — Z8261 Family history of arthritis: Secondary | ICD-10-CM | POA: Insufficient documentation

## 2022-06-15 DIAGNOSIS — C50212 Malignant neoplasm of upper-inner quadrant of left female breast: Secondary | ICD-10-CM

## 2022-06-15 DIAGNOSIS — M8580 Other specified disorders of bone density and structure, unspecified site: Secondary | ICD-10-CM

## 2022-06-15 DIAGNOSIS — Z9049 Acquired absence of other specified parts of digestive tract: Secondary | ICD-10-CM | POA: Insufficient documentation

## 2022-06-15 DIAGNOSIS — K219 Gastro-esophageal reflux disease without esophagitis: Secondary | ICD-10-CM | POA: Insufficient documentation

## 2022-06-15 DIAGNOSIS — I428 Other cardiomyopathies: Secondary | ICD-10-CM | POA: Insufficient documentation

## 2022-06-15 DIAGNOSIS — I10 Essential (primary) hypertension: Secondary | ICD-10-CM | POA: Diagnosis not present

## 2022-06-15 DIAGNOSIS — Z8349 Family history of other endocrine, nutritional and metabolic diseases: Secondary | ICD-10-CM | POA: Diagnosis not present

## 2022-06-15 DIAGNOSIS — I2510 Atherosclerotic heart disease of native coronary artery without angina pectoris: Secondary | ICD-10-CM | POA: Insufficient documentation

## 2022-06-15 DIAGNOSIS — M109 Gout, unspecified: Secondary | ICD-10-CM | POA: Diagnosis not present

## 2022-06-15 DIAGNOSIS — Z8249 Family history of ischemic heart disease and other diseases of the circulatory system: Secondary | ICD-10-CM | POA: Insufficient documentation

## 2022-06-15 DIAGNOSIS — Z833 Family history of diabetes mellitus: Secondary | ICD-10-CM | POA: Diagnosis not present

## 2022-06-15 DIAGNOSIS — Z170 Estrogen receptor positive status [ER+]: Secondary | ICD-10-CM

## 2022-06-15 DIAGNOSIS — Z79899 Other long term (current) drug therapy: Secondary | ICD-10-CM | POA: Insufficient documentation

## 2022-06-15 DIAGNOSIS — Z880 Allergy status to penicillin: Secondary | ICD-10-CM | POA: Insufficient documentation

## 2022-06-15 DIAGNOSIS — Z87891 Personal history of nicotine dependence: Secondary | ICD-10-CM | POA: Insufficient documentation

## 2022-06-15 DIAGNOSIS — Z881 Allergy status to other antibiotic agents status: Secondary | ICD-10-CM | POA: Insufficient documentation

## 2022-06-15 LAB — CBC WITH DIFFERENTIAL/PLATELET
Abs Immature Granulocytes: 0.03 10*3/uL (ref 0.00–0.07)
Basophils Absolute: 0.1 10*3/uL (ref 0.0–0.1)
Basophils Relative: 1 %
Eosinophils Absolute: 0.1 10*3/uL (ref 0.0–0.5)
Eosinophils Relative: 2 %
HCT: 36.1 % (ref 36.0–46.0)
Hemoglobin: 11.3 g/dL — ABNORMAL LOW (ref 12.0–15.0)
Immature Granulocytes: 1 %
Lymphocytes Relative: 21 %
Lymphs Abs: 1.3 10*3/uL (ref 0.7–4.0)
MCH: 28 pg (ref 26.0–34.0)
MCHC: 31.3 g/dL (ref 30.0–36.0)
MCV: 89.4 fL (ref 80.0–100.0)
Monocytes Absolute: 0.7 10*3/uL (ref 0.1–1.0)
Monocytes Relative: 11 %
Neutro Abs: 4.3 10*3/uL (ref 1.7–7.7)
Neutrophils Relative %: 64 %
Platelets: 221 10*3/uL (ref 150–400)
RBC: 4.04 MIL/uL (ref 3.87–5.11)
RDW: 13.2 % (ref 11.5–15.5)
WBC: 6.5 10*3/uL (ref 4.0–10.5)
nRBC: 0 % (ref 0.0–0.2)

## 2022-06-15 LAB — COMPREHENSIVE METABOLIC PANEL
ALT: 22 U/L (ref 0–44)
AST: 25 U/L (ref 15–41)
Albumin: 4.2 g/dL (ref 3.5–5.0)
Alkaline Phosphatase: 69 U/L (ref 38–126)
Anion gap: 10 (ref 5–15)
BUN: 10 mg/dL (ref 8–23)
CO2: 26 mmol/L (ref 22–32)
Calcium: 7.8 mg/dL — ABNORMAL LOW (ref 8.9–10.3)
Chloride: 106 mmol/L (ref 98–111)
Creatinine, Ser: 1.01 mg/dL — ABNORMAL HIGH (ref 0.44–1.00)
GFR, Estimated: 58 mL/min — ABNORMAL LOW (ref >60)
Glucose, Bld: 99 mg/dL (ref 70–99)
Potassium: 3.8 mmol/L (ref 3.5–5.1)
Sodium: 142 mmol/L (ref 135–145)
Total Bilirubin: 0.7 mg/dL (ref 0.3–1.2)
Total Protein: 7.1 g/dL (ref 6.5–8.1)

## 2022-06-15 NOTE — Assessment & Plan Note (Signed)
Recommend calcium and vitamin D supplementation.  

## 2022-06-15 NOTE — Assessment & Plan Note (Addendum)
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 Additional sentinel lymph node biopsy was omitted due to patient's age. Not able to tolerate Arimidex and Aromasin.  She is not interested in switching to other AI's or switching to tamoxifen.   Bilateral diagnostic mammogram in Nov 2023-results were reviewed with patient.  Continue annual mammogram surveillance. 

## 2022-06-15 NOTE — Progress Notes (Signed)
Hematology/Oncology Progress note [phone] [phone]            Patient Care Team: Patrice Paradise, MD as PCP - General (Physician Assistant) Rickard Patience, MD as Consulting Physician (Hematology and Oncology) Carmina Miller, MD as Consulting Physician (Radiation Oncology) Carolan Shiver, MD as Consulting Physician (General Surgery)  ASSESSMENT & PLAN:   Cancer Staging  Malignant neoplasm of upper-inner quadrant of left breast in female, estrogen receptor positive (HCC) Staging form: Breast, AJCC 8th Edition - Pathologic: Stage Unknown (pT75mi, pNX, cM0, G1, ER+, PR+, HER2-) - Signed by Rickard Patience, MD on 06/10/2021   Malignant neoplasm of upper-inner quadrant of left breast in female, 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 Additional sentinel lymph node biopsy was omitted due to patient's age. Not able to tolerate Arimidex and Aromasin.  She is not interested in switching to other AI's or switching to tamoxifen.   Bilateral diagnostic mammogram in Nov 2023-results were reviewed with patient.   Continue annual mammogram surveillance.  Osteopenia Recommend calcium and vitamin D supplementation.   Hypocalcemia Recommend patient to take calcium supplementation, 1000mg  daily  Orders Placed This Encounter  Procedures   MM 3D DIAGNOSTIC MAMMOGRAM BILATERAL BREAST    Standing Status:   Future    Standing Expiration Date:   06/15/2023    Order Specific Question:   Reason for Exam (SYMPTOM  OR DIAGNOSIS REQUIRED)    Answer:   hx breast cance    Order Specific Question:   Preferred imaging location?    Answer:   [facility]   [place] LIMITED ULTRASOUND INCLUDING AXILLA LEFT BREAST     Standing Status:   Future    Standing Expiration Date:   06/15/2023    Order Specific Question:   Reason for Exam (SYMPTOM  OR  DIAGNOSIS REQUIRED)    Answer:   hx breast cancer    Order Specific Question:   Preferred imaging location?    Answer:   [facility]   [place] LIMITED ULTRASOUND INCLUDING AXILLA RIGHT BREAST    Standing Status:   Future    Standing Expiration Date:   06/15/2023    Order Specific Question:   Reason for Exam (SYMPTOM  OR DIAGNOSIS REQUIRED)    Answer:   hx breast cancer    Order Specific Question:   Preferred imaging location?    Answer:   [facility]   CMP (Cancer Center only)    Standing Status:   Future    Standing Expiration Date:   06/15/2023   CBC with Differential (Cancer Center Only)    Standing Status:   Future    Standing Expiration Date:   06/15/2023   Follow up  6 months  lab MD cbc cmp  All questions were answered. The patient knows to call the clinic with any problems, questions or concerns.  Rickard Patience, MD, PhD [facility] Hematology Oncology 06/15/2022   CHIEF COMPLAINTS/REASON FOR VISIT:  left breast cancer  HISTORY OF PRESENTING ILLNESS:   Brenda Combs is a  77 y.o.  female presents for follow up of left breast cancer Oncology History  Malignant neoplasm of upper-inner quadrant of left breast in female, estrogen receptor positive (HCC)  12/18/2020 Mammogram   12/18/2020, screening mammogram bilaterally showed possible mass warrants further evaluation.  Right breast has no findings suspicious for malignancy. 12/28/2020, left breast diagnostic mammogram showed 11:00 1.4 cm suspicious mass.  Associated  calcifications in linear distribution spanning 4 cm.  There is no evidence of left axillary lymphadenopathy.   01/07/2021 Initial Diagnosis   Malignant neoplasm of upper-inner quadrant of left breast in female, estrogen receptor positive   -Ultrasound-guided left breast 11:00 mass biopsy   Pathology showed detached fragment of papillary lesion with sclerosis and atypia. and stereotactic biopsy of left upper inner quadrant calcification.  The pathology showed  papillary lesion with atypia.  Calcifications are present.  No definitive evidence of invasive carcinoma in these biopsies.  -03/01/2021, patient underwent left breast lumpectomy by Dr. Maia Plan. 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 Margins are negative for both invasive carcinoma and DCIS.  pT14mi pNx ER+ [ER expression not visualizing the invasive component due to small size and suboptimal tissue sections.]-PR + HER2 negative     03/09/2021 Cancer Staging   Staging form: Breast, AJCC 8th Edition - Pathologic: Stage Unknown (pT42mi, pNX, cM0, G1, ER+, PR+, HER2-) - Signed by Rickard Patience, MD on 06/10/2021 Stage prefix: Initial diagnosis Multigene prognostic tests performed: None Histologic grading system: 3 grade system   04/13/2021 - 05/20/2021 Radiation Therapy   adjuvant radiation to the left breast.   07/14/2021 Imaging   DEXA -This patient is considered osteopenic  FRAX* RESULTS  10-year Probability of Fracture1 Major Osteoporotic Fracture2 Hip Fracture 5.7% 1.5%   07/14/2021 - 07/28/2021 Anti-estrogen oral therapy   Arimidex 1mg  daily.self stopped due to arthralgia   09/10/2021 -  Anti-estrogen oral therapy   Aromasin 25mg  daily.  Patient tried and stopped shortly after starting.     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  INTERVAL HISTORY Brenda Combs is a 77 y.o. female who has above history reviewed by me today presents for follow up visit for management of left breast DCIS with microinvasive carcinoma. She can not tolerate Aromasin and has stopped.  Chronic arthralgia, no new breast concerns + upcoming cataract surgery.    Review of Systems  Constitutional:  Negative for appetite  change, chills, fatigue and fever.  HENT:   Negative for hearing loss and voice change.   Eyes:  Negative for eye problems.  Respiratory:  Negative for chest tightness and cough.   Cardiovascular:  Negative for chest pain.  Gastrointestinal:  Negative for abdominal distention, abdominal pain and blood in stool.  Endocrine: Negative for hot flashes.  Genitourinary:  Negative for difficulty urinating and frequency.   Musculoskeletal:  Positive for arthralgias.  Skin:  Negative for itching and rash.  Neurological:  Negative for extremity weakness.  Hematological:  Negative for adenopathy.  Psychiatric/Behavioral:  Negative for confusion.     MEDICAL HISTORY:  Past Medical History:  Diagnosis Date   Arthritis    Bilateral carotid artery stenosis    CAD (coronary artery disease)    GERD (gastroesophageal reflux disease)    Gout    Heart murmur    History of abnormal Pap smear    Hyperlipidemia    Hypertension    LBBB (left bundle branch block) 01/20/2021   a.) noted on preoperative ECG   NICM (nonischemic cardiomyopathy) (HCC) 02/15/2021   a.) TTE 02/15/2021: ED 35-40%; mod LV systolic dysfunction with LVH; mild panvalvular regurgitation. b.) Lexiscan 02/15/2021: EF 21%; global LV systolic dysfunction; no significant ischemia.   SOB (shortness of breath) on exertion  SURGICAL HISTORY: Past Surgical History:  Procedure Laterality Date   APPENDECTOMY     BREAST BIOPSY Left 01/07/2021   stereo bx calcs, coil marker, PAPILLARY LESION WITH ATYPIA- CALCIFICATIONS ARE PRESENT. 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, Microinvasive carcinoma   BREAST BIOPSY Left 03/01/2021   Procedure: Excisional BREAST BIOPSY WITH NEEDLE LOCALIZATION (bracket);  Surgeon: Carolan Shiver, MD;  Location: [facility] ORS;  Service:  General;  Laterality: Left;   BREAST EXCISIONAL BIOPSY Left 2022   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, Microinvasive carcinoma   COLONOSCOPY WITH PROPOFOL N/A 11/23/2015   Procedure: COLONOSCOPY WITH PROPOFOL;  Surgeon: Scot Jun, MD;  Location: [facility] ENDOSCOPY;  Service: Endoscopy;  Laterality: N/A;   COLONOSCOPY WITH PROPOFOL N/A 02/07/2017   Procedure: COLONOSCOPY WITH PROPOFOL;  Surgeon: Christena Deem, MD;  Location: [facility] ENDOSCOPY;  Service: Endoscopy;  Laterality: N/A;   ESOPHAGOGASTRODUODENOSCOPY (EGD) WITH PROPOFOL N/A 11/23/2015   Procedure: ESOPHAGOGASTRODUODENOSCOPY (EGD) WITH PROPOFOL;  Surgeon: Scot Jun, MD;  Location: [facility] ENDOSCOPY;  Service: Endoscopy;  Laterality: N/A;   TUBAL LIGATION     WISDOM TOOTH EXTRACTION     x 2    SOCIAL HISTORY: Social History   Socioeconomic History   Marital status: Widowed    Spouse name: Not on file   Number of children: Not on file   Years of education: Not on file   Highest education level: Not on file  Occupational History   Not on file  Tobacco Use   Smoking status: Former    Packs/day: 0.75    Years: 40.00    Additional pack years: 0.00    Total pack years: 30.00    Types: Cigarettes    Quit date: 02/28/2021    Years since quitting: 1.2   Smokeless tobacco: Never  Vaping Use   Vaping Use: Never used  Substance and Sexual Activity   Alcohol use: No   Drug use: No   Sexual activity: Not Currently    Birth control/protection: Post-menopausal    Comment: widow since 2000  Other Topics Concern   Not on file  Social History Narrative   Lives with daughter   Social Determinants of Health   Financial Resource Strain: Not on file  Food Insecurity: Not on file  Transportation Needs: Not on file  Physical Activity: Not on file  Stress: Not on file  Social Connections: Not on file  Intimate Partner Violence: Not on file    FAMILY HISTORY: Family History  Problem Relation Age of Onset   Arthritis Mother     Hyperlipidemia Mother    Hypertension Mother    Hyperlipidemia Sister    Hypertension Sister    Hypertension Daughter    Diabetes Sister    Hyperlipidemia Sister    Hypertension Sister    Hypertension Daughter    Hypertension Daughter    Breast cancer Neg Hx     ALLERGIES:  is allergic to jardiance [empagliflozin], augmentin [amoxicillin-pot clavulanate], and zyrtec [cetirizine hcl].  MEDICATIONS:  Current Outpatient Medications  Medication Sig Dispense Refill   allopurinol (ZYLOPRIM) 100 MG tablet Take 100 mg by mouth daily.     amLODipine (NORVASC) 5 MG tablet Take 7.5 mg by mouth daily.     aspirin 81 MG tablet Take 81 mg by mouth daily.     atorvastatin (LIPITOR) 40 MG tablet Take  40 mg by mouth daily.     carvedilol (COREG) 6.25 MG tablet Take 6.25 mg by mouth 2 (two) times daily.     cyclobenzaprine (FLEXERIL) 5 MG tablet Take 5 mg by mouth 2 (two) times daily as needed for muscle spasms.     diazepam (VALIUM) 5 MG tablet 1-2 30 minutes before ESI     fluticasone (FLONASE) 50 MCG/ACT nasal spray Place 1 spray into both nostrils daily.     labetalol (NORMODYNE) 200 MG tablet Take 200 mg by mouth 2 (two) times daily.     meloxicam (MOBIC) 7.5 MG tablet Take by mouth.     naproxen (NAPROSYN) 500 MG tablet Take 500 mg by mouth 2 (two) times daily as needed for pain.     olmesartan (BENICAR) 40 MG tablet Take by mouth.     omeprazole (PRILOSEC) 40 MG capsule Take 40 mg by mouth 2 (two) times daily.     OYSCO 500 + D 500-5 MG-MCG TABS Take 2 tablets by mouth daily.     sacubitril-valsartan (ENTRESTO) 24-26 MG Take 1 tablet by mouth 2 (two) times daily.     spironolactone (ALDACTONE) 25 MG tablet Take 25 mg by mouth daily.     No current facility-administered medications for this visit.     PHYSICAL EXAMINATION: ECOG PERFORMANCE STATUS: 1 - Symptomatic but completely ambulatory Vitals:   06/15/22 1316  BP: (!) 160/77  Pulse: 72  Resp: 18  Temp: (!) 96.4 F (35.8 C)   SpO2: 98%   Filed Weights   06/15/22 1316  Weight: 197 lb 11.2 oz (89.7 kg)    Physical Exam Constitutional:      General: She is not in acute distress. HENT:     Head: Normocephalic and atraumatic.  Eyes:     General: No scleral icterus. Cardiovascular:     Rate and Rhythm: Normal rate and regular rhythm.     Heart sounds: Normal heart sounds.  Pulmonary:     Effort: Pulmonary effort is normal. No respiratory distress.     Breath sounds: No wheezing.  Abdominal:     General: Bowel sounds are normal. There is no distension.     Palpations: Abdomen is soft.  Musculoskeletal:        General: No deformity. Normal range of motion.     Cervical back: Normal range of motion and neck supple.  Skin:    General: Skin is warm and dry.     Findings: No erythema or rash.  Neurological:     Mental Status: She is alert and oriented to person, place, and time. Mental status is at baseline.     Cranial Nerves: No cranial nerve deficit.     Coordination: Coordination normal.  Psychiatric:        Mood and Affect: Mood normal.     LABORATORY DATA:  I have reviewed the data as listed Lab Results  Component Value Date   WBC 6.5 06/15/2022   HGB 11.3 (L) 06/15/2022   HCT 36.1 06/15/2022   MCV 89.4 06/15/2022   PLT 221 06/15/2022   Recent Labs    09/10/21 0957 10/21/21 0901 12/15/21 1002 06/15/22 1310  NA 139 140 137 142  K 3.7 4.0 3.5 3.8  CL 103 107 104 106  CO2 25 26 26 26   GLUCOSE 113* 103* 111* 99  BUN 11 12 13 10   CREATININE 1.02* 0.79 0.97 1.01*  CALCIUM 9.3 9.4 9.0 7.8*  GFRNONAA 57* >60 >60 58*  PROT  7.2  --  7.3 7.1  ALBUMIN 4.3  --  4.1 4.2  AST 20  --  22 25  ALT 16  --  17 22  ALKPHOS 67  --  70 69  BILITOT 0.7  --  0.7 0.7    RADIOGRAPHIC STUDIES: I have personally reviewed the radiological images as listed and agreed with the findings in the report. No results found.

## 2022-06-15 NOTE — Assessment & Plan Note (Signed)
Recommend patient to take calcium supplementation, 1000mg  daily

## 2022-07-11 ENCOUNTER — Ambulatory Visit
Admission: RE | Admit: 2022-07-11 | Discharge: 2022-07-11 | Disposition: A | Discharge status: Home / Self Care | Payer: Medicare PPO | Source: Ambulatory Visit | Attending: Radiation Oncology | Admitting: Radiation Oncology

## 2022-07-11 ENCOUNTER — Encounter: Payer: Self-pay | Admitting: Radiation Oncology

## 2022-07-11 VITALS — BP 139/79 | HR 63 | Temp 98.6°F | Resp 16 | Ht 60.0 in | Wt 194.0 lb

## 2022-07-11 DIAGNOSIS — Z853 Personal history of malignant neoplasm of breast: Secondary | ICD-10-CM | POA: Diagnosis present

## 2022-07-11 DIAGNOSIS — C50212 Malignant neoplasm of upper-inner quadrant of left female breast: Secondary | ICD-10-CM

## 2022-07-11 DIAGNOSIS — Z923 Personal history of irradiation: Secondary | ICD-10-CM | POA: Insufficient documentation

## 2022-07-11 NOTE — Progress Notes (Signed)
Radiation Oncology Follow up Note  Name: Brenda Combs   Date:   07/11/2022 MRN:  [id number] DOB: [birth year]    This 77 y.o. female presents to the clinic today for 1 year follow-up status post whole breast radiation to her left breast for stage Ia ER/PR positive microinvasive carcinoma.  REFERRING PROVIDER: Patrice Paradise, MD  HPI: Patient is a 77 year old female now out 1 year having completed whole breast radiation to her left breast for stage Ia ER/PR positive invasive mammary carcinoma.  Seen today in routine follow-up she is doing well.  She specifically denies breast tenderness cough or bone pain..  She had been offered endocrine therapy although has declined.  She had mammograms in November which I have reviewed were BI-RADS 2 benign.  COMPLICATIONS OF TREATMENT: none  FOLLOW UP COMPLIANCE: keeps appointments   PHYSICAL EXAM:  BP 139/79   Pulse 63   Temp 98.6 F (37 C) (Tympanic)   Resp 16   Ht 5' (1.524 m)   Wt 194 lb (88 kg)   BMI 37.89 kg/m  Lungs are clear to A&P cardiac examination essentially unremarkable with regular rate and rhythm. No dominant mass or nodularity is noted in either breast in 2 positions examined. Incision is well-healed. No axillary or supraclavicular adenopathy is appreciated. Cosmetic result is excellent.  Well-developed well-nourished patient in NAD. HEENT reveals PERLA, EOMI, discs not visualized.  Oral cavity is clear. No oral mucosal lesions are identified. Neck is clear without evidence of cervical or supraclavicular adenopathy. Lungs are clear to A&P. Cardiac examination is essentially unremarkable with regular rate and rhythm without murmur rub or thrill. Abdomen is benign with no organomegaly or masses noted. Motor sensory and DTR levels are equal and symmetric in the upper and lower extremities. Cranial nerves II through XII are grossly intact. Proprioception is intact. No peripheral adenopathy or edema is identified. No motor or sensory  levels are noted. Crude visual fields are within normal range.  RADIOLOGY RESULTS: Mammograms reviewed compatible with above-stated findings  PLAN: Present time patient is doing well 1 year out with no evidence of disease.  I am pleased with her overall progress.  I have asked to see her back in 1 year for follow-up.  Patient is to call with any concerns.  She is scheduled for repeat mammograms in November.  I would like to take this opportunity to thank you for allowing me to participate in the care of your patient.Carmina Miller, MD

## 2022-12-12 ENCOUNTER — Ambulatory Visit
Admission: RE | Admit: 2022-12-12 | Discharge: 2022-12-12 | Disposition: A | Discharge status: Home / Self Care | Payer: Medicare PPO | Source: Ambulatory Visit | Attending: Oncology | Admitting: Oncology

## 2022-12-12 ENCOUNTER — Ambulatory Visit
Admission: RE | Admit: 2022-12-12 | Discharge: 2022-12-12 | Disposition: A | Discharge status: Home / Self Care | Payer: Medicare PPO | Source: Ambulatory Visit | Attending: Oncology

## 2022-12-12 DIAGNOSIS — Z170 Estrogen receptor positive status [ER+]: Secondary | ICD-10-CM | POA: Insufficient documentation

## 2022-12-12 DIAGNOSIS — C50212 Malignant neoplasm of upper-inner quadrant of left female breast: Secondary | ICD-10-CM | POA: Diagnosis present

## 2022-12-15 ENCOUNTER — Inpatient Hospital Stay: Payer: Medicare PPO | Attending: Oncology

## 2022-12-15 ENCOUNTER — Inpatient Hospital Stay: Payer: Medicare PPO | Admitting: Oncology

## 2022-12-15 ENCOUNTER — Encounter: Payer: Self-pay | Admitting: Oncology

## 2022-12-15 VITALS — BP 143/64 | HR 64 | Temp 96.0°F | Resp 18 | Wt 195.8 lb

## 2022-12-15 DIAGNOSIS — Z87891 Personal history of nicotine dependence: Secondary | ICD-10-CM | POA: Diagnosis not present

## 2022-12-15 DIAGNOSIS — Z8349 Family history of other endocrine, nutritional and metabolic diseases: Secondary | ICD-10-CM | POA: Diagnosis not present

## 2022-12-15 DIAGNOSIS — Z79899 Other long term (current) drug therapy: Secondary | ICD-10-CM | POA: Insufficient documentation

## 2022-12-15 DIAGNOSIS — Z170 Estrogen receptor positive status [ER+]: Secondary | ICD-10-CM | POA: Diagnosis not present

## 2022-12-15 DIAGNOSIS — Z8261 Family history of arthritis: Secondary | ICD-10-CM | POA: Diagnosis not present

## 2022-12-15 DIAGNOSIS — Z8249 Family history of ischemic heart disease and other diseases of the circulatory system: Secondary | ICD-10-CM | POA: Diagnosis not present

## 2022-12-15 DIAGNOSIS — Z9049 Acquired absence of other specified parts of digestive tract: Secondary | ICD-10-CM | POA: Insufficient documentation

## 2022-12-15 DIAGNOSIS — Z881 Allergy status to other antibiotic agents status: Secondary | ICD-10-CM | POA: Diagnosis not present

## 2022-12-15 DIAGNOSIS — Z833 Family history of diabetes mellitus: Secondary | ICD-10-CM | POA: Diagnosis not present

## 2022-12-15 DIAGNOSIS — M109 Gout, unspecified: Secondary | ICD-10-CM | POA: Diagnosis not present

## 2022-12-15 DIAGNOSIS — M8580 Other specified disorders of bone density and structure, unspecified site: Secondary | ICD-10-CM | POA: Insufficient documentation

## 2022-12-15 DIAGNOSIS — C50212 Malignant neoplasm of upper-inner quadrant of left female breast: Secondary | ICD-10-CM | POA: Diagnosis not present

## 2022-12-15 DIAGNOSIS — Z880 Allergy status to penicillin: Secondary | ICD-10-CM | POA: Diagnosis not present

## 2022-12-15 DIAGNOSIS — Z83438 Family history of other disorder of lipoprotein metabolism and other lipidemia: Secondary | ICD-10-CM | POA: Diagnosis not present

## 2022-12-15 DIAGNOSIS — Z1721 Progesterone receptor positive status: Secondary | ICD-10-CM | POA: Diagnosis not present

## 2022-12-15 DIAGNOSIS — I10 Essential (primary) hypertension: Secondary | ICD-10-CM | POA: Insufficient documentation

## 2022-12-15 LAB — CMP (CANCER CENTER ONLY)
ALT: 17 U/L (ref 0–44)
AST: 22 U/L (ref 15–41)
Albumin: 4.2 g/dL (ref 3.5–5.0)
Alkaline Phosphatase: 73 U/L (ref 38–126)
Anion gap: 12 (ref 5–15)
BUN: 11 mg/dL (ref 8–23)
CO2: 27 mmol/L (ref 22–32)
Calcium: 9.3 mg/dL (ref 8.9–10.3)
Chloride: 102 mmol/L (ref 98–111)
Creatinine: 0.93 mg/dL (ref 0.44–1.00)
GFR, Estimated: >60 mL/min (ref >60)
Glucose, Bld: 110 mg/dL — ABNORMAL HIGH (ref 70–99)
Potassium: 3.6 mmol/L (ref 3.5–5.1)
Sodium: 141 mmol/L (ref 135–145)
Total Bilirubin: 0.8 mg/dL (ref <1.2)
Total Protein: 7.1 g/dL (ref 6.5–8.1)

## 2022-12-15 LAB — CBC WITH DIFFERENTIAL (CANCER CENTER ONLY)
Abs Immature Granulocytes: 0.02 10*3/uL (ref 0.00–0.07)
Basophils Absolute: 0.1 10*3/uL (ref 0.0–0.1)
Basophils Relative: 1 %
Eosinophils Absolute: 0.1 10*3/uL (ref 0.0–0.5)
Eosinophils Relative: 1 %
HCT: 39.1 % (ref 36.0–46.0)
Hemoglobin: 12.2 g/dL (ref 12.0–15.0)
Immature Granulocytes: 0 %
Lymphocytes Relative: 21 %
Lymphs Abs: 1.5 10*3/uL (ref 0.7–4.0)
MCH: 27.9 pg (ref 26.0–34.0)
MCHC: 31.2 g/dL (ref 30.0–36.0)
MCV: 89.5 fL (ref 80.0–100.0)
Monocytes Absolute: 0.7 10*3/uL (ref 0.1–1.0)
Monocytes Relative: 10 %
Neutro Abs: 5 10*3/uL (ref 1.7–7.7)
Neutrophils Relative %: 67 %
Platelet Count: 201 10*3/uL (ref 150–400)
RBC: 4.37 MIL/uL (ref 3.87–5.11)
RDW: 13.7 % (ref 11.5–15.5)
WBC Count: 7.4 10*3/uL (ref 4.0–10.5)
nRBC: 0 % (ref 0.0–0.2)

## 2022-12-15 NOTE — Progress Notes (Signed)
Hematology/Oncology Progress note [phone] [phone]            Patient Care Team: Patrice Paradise, MD as PCP - General (Physician Assistant) Rickard Patience, MD as Consulting Physician (Hematology and Oncology) Carmina Miller, MD as Consulting Physician (Radiation Oncology) Carolan Shiver, MD as Consulting Physician (General Surgery)  ASSESSMENT & PLAN:   Cancer Staging  Malignant neoplasm of upper-inner quadrant of left breast in female, estrogen receptor positive (HCC) Staging form: Breast, AJCC 8th Edition - Pathologic: Stage Unknown (pT31mi, pNX, cM0, G1, ER+, PR+, HER2-) - Signed by Rickard Patience, MD on 06/10/2021   Malignant neoplasm of upper-inner quadrant of left breast in female, estrogen receptor positive (HCC) #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 Additional sentinel lymph node biopsy was omitted due to patient's age. Not able to tolerate Arimidex and Aromasin.  She is not interested in switching to other AI's or switching to tamoxifen.   Bilateral diagnostic mammogram in Nov 2024-results were reviewed with patient.   Continue annual mammogram surveillance.  Osteopenia Recommend calcium and vitamin D supplementation.   Orders Placed This Encounter  Procedures   CBC with Differential (Cancer Center Only)    Standing Status:   Future    Standing Expiration Date:   12/15/2023   CMP (Cancer Center only)    Standing Status:   Future    Standing Expiration Date:   12/15/2023   Follow up  6 months  lab MD cbc cmp  All questions were answered. The patient knows to call the clinic with any problems, questions or concerns.  Rickard Patience, MD, PhD [facility] Hematology Oncology 12/15/2022   CHIEF COMPLAINTS/REASON FOR VISIT:  left breast cancer  HISTORY OF PRESENTING ILLNESS:   Brenda Combs is a  76 y.o.  female presents for  follow up of left breast cancer Oncology History  Malignant neoplasm of upper-inner quadrant of left breast in female, estrogen receptor positive (HCC)  12/18/2020 Mammogram   12/18/2020, screening mammogram bilaterally showed possible mass warrants further evaluation.  Right breast has no findings suspicious for malignancy. 12/28/2020, left breast diagnostic mammogram showed 11:00 1.4 cm suspicious mass.  Associated calcifications in linear distribution spanning 4 cm.  There is no evidence of left axillary lymphadenopathy.   01/07/2021 Initial Diagnosis   Malignant neoplasm of upper-inner quadrant of left breast in female, estrogen receptor positive   -Ultrasound-guided left breast 11:00 mass biopsy   Pathology showed detached fragment of papillary lesion with sclerosis and atypia. and stereotactic biopsy of left upper inner quadrant calcification.  The pathology showed papillary lesion with atypia.  Calcifications are present.  No definitive evidence of invasive carcinoma in these biopsies.  -03/01/2021, patient underwent left breast lumpectomy by Dr. Maia Plan. 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 Margins are negative for both invasive carcinoma and DCIS.  pT38mi pNx ER+ [ER expression not visualizing the invasive component due to small size and suboptimal tissue sections.]-PR + HER2 negative     03/09/2021 Cancer Staging   Staging form: Breast, AJCC 8th Edition - Pathologic: Stage Unknown (pT86mi, pNX, cM0, G1, ER+, PR+, HER2-) - Signed by Rickard Patience, MD on 06/10/2021 Stage prefix: Initial diagnosis Multigene prognostic tests performed: None Histologic grading system: 3 grade system   04/13/2021 - 05/20/2021 Radiation Therapy   adjuvant radiation to the left breast.   07/14/2021 Imaging  DEXA -This patient is considered osteopenic  FRAX* RESULTS   10-year Probability of Fracture1 Major Osteoporotic Fracture2 Hip Fracture 5.7% 1.5%   07/14/2021 - 07/28/2021 Anti-estrogen oral therapy   Arimidex 1mg  daily.self stopped due to arthralgia   09/10/2021 -  Anti-estrogen oral therapy   Aromasin 25mg  daily.  Patient tried and stopped shortly after starting.     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  INTERVAL HISTORY Brenda Combs is a 77 y.o. female who has above history reviewed by me today presents for follow up visit for management of left breast DCIS with microinvasive carcinoma. She can not tolerate Aromasin and has stopped.  Chronic arthralgia, no new breast concerns    Review of Systems  Constitutional:  Negative for appetite change, chills, fatigue and fever.  HENT:   Negative for hearing loss and voice change.   Eyes:  Negative for eye problems.  Respiratory:  Negative for chest tightness and cough.   Cardiovascular:  Negative for chest pain.  Gastrointestinal:  Negative for abdominal distention, abdominal pain and blood in stool.  Endocrine: Negative for hot flashes.  Genitourinary:  Negative for difficulty urinating and frequency.   Musculoskeletal:  Positive for arthralgias.  Skin:  Negative for itching and rash.  Neurological:  Negative for extremity weakness.  Hematological:  Negative for adenopathy.  Psychiatric/Behavioral:  Negative for confusion.     MEDICAL HISTORY:  Past Medical History:  Diagnosis Date   Arthritis    Bilateral carotid artery stenosis    CAD (coronary artery disease)    GERD (gastroesophageal reflux disease)    Gout    Heart murmur    History of abnormal Pap smear    Hyperlipidemia    Hypertension    LBBB (left bundle branch block) 01/20/2021   a.) noted on preoperative ECG   NICM (nonischemic cardiomyopathy) (HCC) 02/15/2021    a.) TTE 02/15/2021: ED 35-40%; mod LV systolic dysfunction with LVH; mild panvalvular regurgitation. b.) Lexiscan 02/15/2021: EF 21%; global LV systolic dysfunction; no significant ischemia.   SOB (shortness of breath) on exertion     SURGICAL HISTORY: Past Surgical History:  Procedure Laterality Date   APPENDECTOMY     BREAST BIOPSY Left 01/07/2021   stereo bx calcs, coil marker, PAPILLARY LESION WITH ATYPIA- CALCIFICATIONS ARE PRESENT. 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, Microinvasive carcinoma   COLONOSCOPY WITH PROPOFOL N/A 11/23/2015   Procedure: COLONOSCOPY WITH PROPOFOL;  Surgeon: Scot Jun, MD;  Location: [facility] ENDOSCOPY;  Service: Endoscopy;  Laterality: N/A;   COLONOSCOPY WITH PROPOFOL N/A 02/07/2017   Procedure: COLONOSCOPY WITH PROPOFOL;  Surgeon: Christena Deem, MD;  Location: [facility] ENDOSCOPY;  Service: Endoscopy;  Laterality: N/A;   ESOPHAGOGASTRODUODENOSCOPY (EGD) WITH PROPOFOL N/A 11/23/2015   Procedure: ESOPHAGOGASTRODUODENOSCOPY (EGD) WITH PROPOFOL;  Surgeon: Scot Jun, MD;  Location: [facility] ENDOSCOPY;  Service: Endoscopy;  Laterality: N/A;   TUBAL LIGATION     WISDOM TOOTH EXTRACTION     x 2  SOCIAL HISTORY: Social History   Socioeconomic History   Marital status: Widowed    Spouse name: Not on file   Number of children: Not on file   Years of education: Not on file   Highest education level: Not on file  Occupational History   Not on file  Tobacco Use   Smoking status: Former    Current  packs/day: 0.00    Average packs/day: 0.8 packs/day for 40.0 years (30.0 ttl pk-yrs)    Types: Cigarettes    Start date: 02/28/1981    Quit date: 02/28/2021    Years since quitting: 1.7   Smokeless tobacco: Never  Vaping Use   Vaping status: Never Used  Substance and Sexual Activity   Alcohol use: No   Drug use: No   Sexual activity: Not Currently    Birth control/protection: Post-menopausal    Comment: widow since 2000  Other Topics Concern   Not on file  Social History Narrative   Lives with daughter   Social Determinants of Health   Financial Resource Strain: Low Risk  (11/21/2022)   Received from [facility]   Overall Financial Resource Strain (CARDIA)    Difficulty of Paying Living Expenses: Not hard at all  Food Insecurity: No Food Insecurity (11/21/2022)   Received from [facility]   Hunger Vital Sign    Worried About Running Out of Food in the Last Year: Never true    Ran Out of Food in the Last Year: Never true  Transportation Needs: No Transportation Needs (11/21/2022)   Received from [facility] - Transportation    In the past 12 months, has lack of transportation kept you from medical appointments or from getting medications?: No    Lack of Transportation (Non-Medical): No  Physical Activity: Not on file  Stress: Not on file  Social Connections: Not on file  Intimate Partner Violence: Not on file    FAMILY HISTORY: Family History  Problem Relation Age of Onset   Arthritis Mother    Hyperlipidemia Mother    Hypertension Mother    Hyperlipidemia Sister    Hypertension Sister    Hypertension Daughter    Diabetes Sister    Hyperlipidemia Sister    Hypertension Sister    Hypertension Daughter    Hypertension Daughter    Breast cancer Neg Hx     ALLERGIES:  is allergic to jardiance [empagliflozin], augmentin [amoxicillin-pot clavulanate], and zyrtec [cetirizine hcl].  MEDICATIONS:   Current Outpatient Medications  Medication Sig Dispense Refill   allopurinol (ZYLOPRIM) 100 MG tablet Take 100 mg by mouth daily.     amLODipine (NORVASC) 5 MG tablet Take 7.5 mg by mouth daily.     aspirin 81 MG tablet Take 81 mg by mouth daily.     atorvastatin (LIPITOR) 40 MG tablet Take 40 mg by mouth daily.     carvedilol (COREG) 6.25 MG tablet Take 6.25 mg by mouth 2 (two) times daily.     Cyanocobalamin (VITAMIN B-12) 500 MCG SUBL Place 1 tablet under the tongue daily at 6 (six) AM.     cyclobenzaprine (FLEXERIL) 5 MG tablet Take 5 mg by mouth 2 (two) times daily as needed for muscle spasms.     diazepam (VALIUM) 5 MG tablet 1-2 30 minutes before ESI     fluticasone (FLONASE) 50 MCG/ACT nasal spray Place 1 spray into both nostrils daily.     labetalol (NORMODYNE) 200 MG tablet Take 200  mg by mouth 2 (two) times daily.     meloxicam (MOBIC) 7.5 MG tablet Take by mouth.     naproxen (NAPROSYN) 500 MG tablet Take 500 mg by mouth 2 (two) times daily as needed for pain.     olmesartan (BENICAR) 40 MG tablet Take by mouth.     omeprazole (PRILOSEC) 40 MG capsule Take 40 mg by mouth 2 (two) times daily.     OYSCO 500 + D 500-5 MG-MCG TABS Take 2 tablets by mouth daily.     sacubitril-valsartan (ENTRESTO) 24-26 MG Take 1 tablet by mouth 2 (two) times daily.     spironolactone (ALDACTONE) 25 MG tablet Take 25 mg by mouth daily.     No current facility-administered medications for this visit.     PHYSICAL EXAMINATION: ECOG PERFORMANCE STATUS: 1 - Symptomatic but completely ambulatory Vitals:   12/15/22 0958  BP: (!) 143/64  Pulse: 64  Resp: 18  Temp: (!) 96 F (35.6 C)  SpO2: 100%   Filed Weights   12/15/22 0958  Weight: 195 lb 12.8 oz (88.8 kg)    Physical Exam Constitutional:      General: She is not in acute distress. HENT:     Head: Normocephalic and atraumatic.  Eyes:     General: No scleral icterus. Cardiovascular:     Rate and Rhythm: Normal rate and regular  rhythm.     Heart sounds: Normal heart sounds.  Pulmonary:     Effort: Pulmonary effort is normal. No respiratory distress.     Breath sounds: No wheezing.  Abdominal:     General: Bowel sounds are normal. There is no distension.     Palpations: Abdomen is soft.  Musculoskeletal:        General: No deformity. Normal range of motion.     Cervical back: Normal range of motion and neck supple.  Skin:    General: Skin is warm and dry.     Findings: No erythema or rash.  Neurological:     Mental Status: She is alert and oriented to person, place, and time. Mental status is at baseline.     Cranial Nerves: No cranial nerve deficit.     Coordination: Coordination normal.  Psychiatric:        Mood and Affect: Mood normal.     LABORATORY DATA:  I have reviewed the data as listed Lab Results  Component Value Date   WBC 7.4 12/15/2022   HGB 12.2 12/15/2022   HCT 39.1 12/15/2022   MCV 89.5 12/15/2022   PLT 201 12/15/2022   Recent Labs    06/15/22 1310 12/15/22 0949  NA 142 141  K 3.8 3.6  CL 106 102  CO2 26 27  GLUCOSE 99 110*  BUN 10 11  CREATININE 1.01* 0.93  CALCIUM 7.8* 9.3  GFRNONAA 58* >60  PROT 7.1 7.1  ALBUMIN 4.2 4.2  AST 25 22  ALT 22 17  ALKPHOS 69 73  BILITOT 0.7 0.8   RADIOGRAPHIC STUDIES: I have personally reviewed the radiological images as listed and agreed with the findings in the report. MM 3D DIAGNOSTIC MAMMOGRAM BILATERAL BREAST  Result Date: 12/12/2022 CLINICAL DATA:  LEFT lumpectomy January 2023 which demonstrated micro invasive mammary carcinoma ductal carcinoma in-situ with sclerosing intraductal papillomas with DCIS. EXAM: DIGITAL DIAGNOSTIC BILATERAL MAMMOGRAM WITH TOMOSYNTHESIS AND CAD TECHNIQUE: Bilateral digital diagnostic mammography and breast tomosynthesis was performed. The images were evaluated with computer-aided detection. COMPARISON:  Previous exam(s). ACR Breast Density Category c: The  breasts are heterogeneously dense, which may  obscure small masses. FINDINGS: There is density and architectural distortion within the LEFT breast, consistent with postsurgical changes. These are stable in comparison to prior. Diffuse bilateral coarse calcifications, grossly similar in comparison to priors. No suspicious mass, distortion, or microcalcifications are identified to suggest presence of malignancy. IMPRESSION: No mammographic evidence of malignancy bilaterally. RECOMMENDATION: Recommend bilateral diagnostic mammogram (with RIGHT and LEFT breast ultrasound if deemed necessary) in 1 year to establish over 2 years of stability status post lumpectomy. I have discussed the findings and recommendations with the patient. If applicable, a reminder letter will be sent to the patient regarding the next appointment. BI-RADS CATEGORY  2: Benign. Electronically Signed   By: Meda Klinefelter M.D.   On: 12/12/2022 10:43

## 2022-12-15 NOTE — Assessment & Plan Note (Addendum)
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 Additional sentinel lymph node biopsy was omitted due to patient's age. Not able to tolerate Arimidex and Aromasin.  She is not interested in switching to other AI's or switching to tamoxifen.   Bilateral diagnostic mammogram in Nov 2024-results were reviewed with patient.   Continue annual mammogram surveillance.

## 2022-12-15 NOTE — Assessment & Plan Note (Signed)
Recommend calcium and vitamin D supplementation.  

## 2023-03-07 ENCOUNTER — Other Ambulatory Visit: Payer: Self-pay | Admitting: Physician Assistant

## 2023-03-07 DIAGNOSIS — R1012 Left upper quadrant pain: Secondary | ICD-10-CM

## 2023-03-07 DIAGNOSIS — R252 Cramp and spasm: Secondary | ICD-10-CM

## 2023-03-17 ENCOUNTER — Ambulatory Visit
Admission: RE | Admit: 2023-03-17 | Discharge: 2023-03-17 | Disposition: A | Discharge status: Home / Self Care | Payer: Medicare PPO | Source: Ambulatory Visit | Attending: Physician Assistant | Admitting: Physician Assistant

## 2023-03-17 DIAGNOSIS — R252 Cramp and spasm: Secondary | ICD-10-CM | POA: Diagnosis present

## 2023-03-17 DIAGNOSIS — R1012 Left upper quadrant pain: Secondary | ICD-10-CM | POA: Insufficient documentation

## 2023-03-17 MED ORDER — IOHEXOL 300 MG/ML  SOLN
100.0000 mL | Freq: Once | INTRAMUSCULAR | Status: AC | PRN
  Administered 2023-03-17: 100 mL via INTRAVENOUS

## 2023-03-26 ENCOUNTER — Other Ambulatory Visit: Payer: Self-pay

## 2023-03-26 ENCOUNTER — Inpatient Hospital Stay
Admission: EM | Admit: 2023-03-26 | Discharge: 2023-03-28 | DRG: 229 | Disposition: A | Discharge status: Home / Self Care | Payer: Medicare PPO | Attending: Internal Medicine | Admitting: Internal Medicine

## 2023-03-26 DIAGNOSIS — F17210 Nicotine dependence, cigarettes, uncomplicated: Secondary | ICD-10-CM | POA: Diagnosis present

## 2023-03-26 DIAGNOSIS — Z8261 Family history of arthritis: Secondary | ICD-10-CM | POA: Diagnosis not present

## 2023-03-26 DIAGNOSIS — Z87891 Personal history of nicotine dependence: Secondary | ICD-10-CM

## 2023-03-26 DIAGNOSIS — Z7982 Long term (current) use of aspirin: Secondary | ICD-10-CM

## 2023-03-26 DIAGNOSIS — I442 Atrioventricular block, complete: Principal | ICD-10-CM | POA: Insufficient documentation

## 2023-03-26 DIAGNOSIS — R001 Bradycardia, unspecified: Secondary | ICD-10-CM | POA: Diagnosis present

## 2023-03-26 DIAGNOSIS — Z8249 Family history of ischemic heart disease and other diseases of the circulatory system: Secondary | ICD-10-CM | POA: Diagnosis not present

## 2023-03-26 DIAGNOSIS — Z833 Family history of diabetes mellitus: Secondary | ICD-10-CM | POA: Diagnosis not present

## 2023-03-26 DIAGNOSIS — Z716 Tobacco abuse counseling: Secondary | ICD-10-CM | POA: Diagnosis not present

## 2023-03-26 DIAGNOSIS — I10 Essential (primary) hypertension: Secondary | ICD-10-CM | POA: Diagnosis present

## 2023-03-26 DIAGNOSIS — F17200 Nicotine dependence, unspecified, uncomplicated: Secondary | ICD-10-CM | POA: Diagnosis present

## 2023-03-26 DIAGNOSIS — C50212 Malignant neoplasm of upper-inner quadrant of left female breast: Secondary | ICD-10-CM | POA: Diagnosis not present

## 2023-03-26 DIAGNOSIS — Z170 Estrogen receptor positive status [ER+]: Secondary | ICD-10-CM

## 2023-03-26 DIAGNOSIS — Z006 Encounter for examination for normal comparison and control in clinical research program: Secondary | ICD-10-CM

## 2023-03-26 DIAGNOSIS — K219 Gastro-esophageal reflux disease without esophagitis: Secondary | ICD-10-CM | POA: Diagnosis present

## 2023-03-26 DIAGNOSIS — I2510 Atherosclerotic heart disease of native coronary artery without angina pectoris: Secondary | ICD-10-CM | POA: Diagnosis present

## 2023-03-26 DIAGNOSIS — Z853 Personal history of malignant neoplasm of breast: Secondary | ICD-10-CM | POA: Diagnosis not present

## 2023-03-26 DIAGNOSIS — E7800 Pure hypercholesterolemia, unspecified: Secondary | ICD-10-CM | POA: Diagnosis present

## 2023-03-26 DIAGNOSIS — Z79899 Other long term (current) drug therapy: Secondary | ICD-10-CM | POA: Diagnosis not present

## 2023-03-26 DIAGNOSIS — Z888 Allergy status to other drugs, medicaments and biological substances status: Secondary | ICD-10-CM

## 2023-03-26 DIAGNOSIS — E66812 Obesity, class 2: Secondary | ICD-10-CM | POA: Diagnosis present

## 2023-03-26 DIAGNOSIS — Z83438 Family history of other disorder of lipoprotein metabolism and other lipidemia: Secondary | ICD-10-CM

## 2023-03-26 DIAGNOSIS — Z23 Encounter for immunization: Secondary | ICD-10-CM | POA: Diagnosis present

## 2023-03-26 DIAGNOSIS — Z6839 Body mass index (BMI) 39.0-39.9, adult: Secondary | ICD-10-CM

## 2023-03-26 DIAGNOSIS — I428 Other cardiomyopathies: Secondary | ICD-10-CM | POA: Diagnosis present

## 2023-03-26 DIAGNOSIS — Z923 Personal history of irradiation: Secondary | ICD-10-CM | POA: Diagnosis not present

## 2023-03-26 DIAGNOSIS — R54 Age-related physical debility: Secondary | ICD-10-CM | POA: Diagnosis present

## 2023-03-26 DIAGNOSIS — Z950 Presence of cardiac pacemaker: Secondary | ICD-10-CM | POA: Diagnosis not present

## 2023-03-26 LAB — CBC WITH DIFFERENTIAL/PLATELET
Abs Immature Granulocytes: 0.02 10*3/uL (ref 0.00–0.07)
Basophils Absolute: 0 10*3/uL (ref 0.0–0.1)
Basophils Relative: 0 %
Eosinophils Absolute: 0.1 10*3/uL (ref 0.0–0.5)
Eosinophils Relative: 2 %
HCT: 35.8 % — ABNORMAL LOW (ref 36.0–46.0)
Hemoglobin: 11.2 g/dL — ABNORMAL LOW (ref 12.0–15.0)
Immature Granulocytes: 0 %
Lymphocytes Relative: 20 %
Lymphs Abs: 1.4 10*3/uL (ref 0.7–4.0)
MCH: 28.6 pg (ref 26.0–34.0)
MCHC: 31.3 g/dL (ref 30.0–36.0)
MCV: 91.3 fL (ref 80.0–100.0)
Monocytes Absolute: 0.5 10*3/uL (ref 0.1–1.0)
Monocytes Relative: 7 %
Neutro Abs: 4.8 10*3/uL (ref 1.7–7.7)
Neutrophils Relative %: 71 %
Platelets: 195 10*3/uL (ref 150–400)
RBC: 3.92 MIL/uL (ref 3.87–5.11)
RDW: 13.3 % (ref 11.5–15.5)
WBC: 6.8 10*3/uL (ref 4.0–10.5)
nRBC: 0 % (ref 0.0–0.2)

## 2023-03-26 LAB — MRSA NEXT GEN BY PCR, NASAL: MRSA by PCR Next Gen: NOT DETECTED

## 2023-03-26 LAB — COMPREHENSIVE METABOLIC PANEL
ALT: 14 U/L (ref 0–44)
AST: 19 U/L (ref 15–41)
Albumin: 3.6 g/dL (ref 3.5–5.0)
Alkaline Phosphatase: 58 U/L (ref 38–126)
Anion gap: 10 (ref 5–15)
BUN: 12 mg/dL (ref 8–23)
CO2: 22 mmol/L (ref 22–32)
Calcium: 8 mg/dL — ABNORMAL LOW (ref 8.9–10.3)
Chloride: 110 mmol/L (ref 98–111)
Creatinine, Ser: 0.96 mg/dL (ref 0.44–1.00)
GFR, Estimated: >60 mL/min (ref >60)
Glucose, Bld: 157 mg/dL — ABNORMAL HIGH (ref 70–99)
Potassium: 3.5 mmol/L (ref 3.5–5.1)
Sodium: 142 mmol/L (ref 135–145)
Total Bilirubin: 0.6 mg/dL (ref 0.0–1.2)
Total Protein: 6.1 g/dL — ABNORMAL LOW (ref 6.5–8.1)

## 2023-03-26 LAB — TROPONIN I (HIGH SENSITIVITY)
Troponin I (High Sensitivity): 16 ng/L (ref <18)
Troponin I (High Sensitivity): 16 ng/L (ref <18)

## 2023-03-26 LAB — MAGNESIUM
Magnesium: 1 mg/dL — ABNORMAL LOW (ref 1.7–2.4)
Magnesium: 1.6 mg/dL — ABNORMAL LOW (ref 1.7–2.4)

## 2023-03-26 LAB — GLUCOSE, CAPILLARY: Glucose-Capillary: 113 mg/dL — ABNORMAL HIGH (ref 70–99)

## 2023-03-26 LAB — PHOSPHORUS: Phosphorus: 2.5 mg/dL (ref 2.5–4.6)

## 2023-03-26 MED ORDER — SENNOSIDES-DOCUSATE SODIUM 8.6-50 MG PO TABS: 1.0000 | ORAL_TABLET | Freq: Every evening | ORAL | Status: DC | PRN

## 2023-03-26 MED ORDER — CALCIUM CARBONATE ANTACID 500 MG PO CHEW
1.0000 | CHEWABLE_TABLET | Freq: Once | ORAL | Status: AC
  Administered 2023-03-26: 200 mg via ORAL
  Filled 2023-03-26: qty 1

## 2023-03-26 MED ORDER — ONDANSETRON HCL 4 MG PO TABS: 4.0000 mg | ORAL_TABLET | Freq: Four times a day (QID) | ORAL | Status: DC | PRN

## 2023-03-26 MED ORDER — MAGNESIUM SULFATE IN D5W 1-5 GM/100ML-% IV SOLN
1.0000 g | Freq: Once | INTRAVENOUS | Status: AC
  Administered 2023-03-26: 1 g via INTRAVENOUS
  Filled 2023-03-26: qty 100

## 2023-03-26 MED ORDER — ASPIRIN 81 MG PO TBEC
81.0000 mg | DELAYED_RELEASE_TABLET | Freq: Every day | ORAL | Status: DC
  Administered 2023-03-27 – 2023-03-28 (×2): 81 mg via ORAL
  Filled 2023-03-26 (×3): qty 1

## 2023-03-26 MED ORDER — ALLOPURINOL 100 MG PO TABS
100.0000 mg | ORAL_TABLET | Freq: Every day | ORAL | Status: DC
  Administered 2023-03-27 – 2023-03-28 (×2): 100 mg via ORAL
  Filled 2023-03-26 (×2): qty 1

## 2023-03-26 MED ORDER — ONDANSETRON HCL 4 MG/2ML IJ SOLN: 4.0000 mg | Freq: Four times a day (QID) | INTRAMUSCULAR | Status: DC | PRN

## 2023-03-26 MED ORDER — ATROPINE SULFATE 1 MG/ML IV SOLN
0.5000 mg | Freq: Once | INTRAVENOUS | Status: AC
  Administered 2023-03-26: 0.5 mg via INTRAVENOUS
  Filled 2023-03-26: qty 0.5

## 2023-03-26 MED ORDER — ACETAMINOPHEN 650 MG RE SUPP
650.0000 mg | Freq: Four times a day (QID) | RECTAL | Status: DC | PRN
Start: 2023-03-26 — End: 2023-03-31

## 2023-03-26 MED ORDER — CHLORHEXIDINE GLUCONATE CLOTH 2 % EX PADS
6.0000 | MEDICATED_PAD | Freq: Every day | CUTANEOUS | Status: DC
Start: 2023-03-27 — End: 2023-03-28
  Administered 2023-03-27: 6 via TOPICAL

## 2023-03-26 MED ORDER — MAGNESIUM SULFATE 2 GM/50ML IV SOLN
2.0000 g | Freq: Once | INTRAVENOUS | Status: AC
  Administered 2023-03-26: 2 g via INTRAVENOUS
  Filled 2023-03-26: qty 50

## 2023-03-26 MED ORDER — HEPARIN SODIUM (PORCINE) 5000 UNIT/ML IJ SOLN
5000.0000 [IU] | Freq: Three times a day (TID) | INTRAMUSCULAR | Status: DC
  Administered 2023-03-26 – 2023-03-28 (×5): 5000 [IU] via SUBCUTANEOUS
  Filled 2023-03-26 (×5): qty 1

## 2023-03-26 MED ORDER — HYDRALAZINE HCL 20 MG/ML IJ SOLN
5.0000 mg | INTRAMUSCULAR | Status: DC | PRN
  Administered 2023-03-27: 5 mg via INTRAVENOUS
  Filled 2023-03-26: qty 1

## 2023-03-26 MED ORDER — ACETAMINOPHEN 325 MG PO TABS: 650.0000 mg | ORAL_TABLET | Freq: Four times a day (QID) | ORAL | Status: DC | PRN

## 2023-03-26 MED ORDER — IPRATROPIUM-ALBUTEROL 0.5-2.5 (3) MG/3ML IN SOLN: 3.0000 mL | Freq: Four times a day (QID) | RESPIRATORY_TRACT | Status: DC | PRN

## 2023-03-26 NOTE — Assessment & Plan Note (Addendum)
 Suspect complete heart block Status post atropine 0.5 mg IV one-time dose per EDP Magnesium level was found to be decreased at 1.0, status post magnesium 2 g IV one-time dose per EDP Complete echo ordered on admission EDP has consulted cardiology, [facility] who states they will see the patient Epic order placed for cardiology, to [facility] Telemetry N.p.o. after midnight in anticipation of permanent pacemaker placement

## 2023-03-26 NOTE — Hospital Course (Addendum)
 [name] is a 78 year old female with history of hypertension, heart failure, hyperlipidemia, GERD, who presents to the emergency department for chief concerns of low blood pressure and low heart rate  Vitals in the ED showed temperature of 97.9, respiration rate of 17, heart rate of 38, blood pressure 175/58, currently at bedside in the 150s over 60s, SpO2 of 100% on room air.  Serum sodium is 142, potassium 2.5, chloride 110, bicarb 22, BUN of 12, serum creatinine of 0.96, EGFR greater than 60, nonfasting blood glucose 157, WBC 6.8, hemoglobin 11.2, platelets of 195.  High sensitive troponin was 16.  Serum calcium level was 8.0.  ED treatment: Magnesium 2 g IV one-time dose, atropine 0.5 mg IV one-time dose.  EDP consulted cardiology who states that they will see the patient.

## 2023-03-26 NOTE — Assessment & Plan Note (Signed)
 Patient seen she does not smoke every day and sometimes she smokes 2-3 times per day She had 1 cigarette prior to coming to the ED today Patient states she is ready to quit now

## 2023-03-26 NOTE — ED Notes (Signed)
 Pads applied to pt. Code cart standing by in rm.

## 2023-03-26 NOTE — Assessment & Plan Note (Addendum)
 Mild; corrected sCa is 8.3 Calcium carbonate once ordered

## 2023-03-26 NOTE — Consult Note (Signed)
 [facility]  CARDIOLOGY CONSULT NOTE  Patient ID: Brenda Combs MRN: [id number] DOB/AGE: 77-Jan-1947 77 y.o.  Admit date: 03/26/2023 Referring Physician Dr. Roxan Hockey Primary Cardiologist [facility] cardiology Reason for Consultation complete heart block  HPI: 78 year old female on home our service is consulted for complete heart block.  Past medical history significant for nonischemic cardiomyopathy with reduced LVEF of 35% which improved to 50% on last echo 10/2022, CAD as noted on CT scan, hypertension, hyperlipidemia, left bundle branch block.  Patient lives with her daughter.  Has been having symptoms of some head/nasal congestion and noted her heart rate to be 35 on examination.  Presented to ED for further evaluation.  No complaints of dizziness or syncope.  No chest pain/pressure or increased shortness of breath.  EKG showed sinus rhythm with complete heart block, escape rhythm with ventricular rate of 38 bpm.  Currently patient resting comfortably in bed with daughter at bedside.  Review of systems complete and found to be negative unless listed above     Past Medical History:  Diagnosis Date   Arthritis    Bilateral carotid artery stenosis    CAD (coronary artery disease)    GERD (gastroesophageal reflux disease)    Gout    Heart murmur    History of abnormal Pap smear    Hyperlipidemia    Hypertension    LBBB (left bundle branch block) 01/20/2021   a.) noted on preoperative ECG   NICM (nonischemic cardiomyopathy) (HCC) 02/15/2021   a.) TTE 02/15/2021: ED 35-40%; mod LV systolic dysfunction with LVH; mild panvalvular regurgitation. b.) Lexiscan 02/15/2021: EF 21%; global LV systolic dysfunction; no significant ischemia.   SOB (shortness of breath) on exertion     Past Surgical History:  Procedure Laterality Date   APPENDECTOMY     BREAST BIOPSY Left 01/07/2021   stereo bx calcs, coil marker, PAPILLARY LESION WITH ATYPIA- CALCIFICATIONS ARE PRESENT. 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, Microinvasive carcinoma   COLONOSCOPY WITH PROPOFOL N/A 11/23/2015   Procedure: COLONOSCOPY WITH PROPOFOL;  Surgeon: Scot Jun, MD;  Location: [facility] ENDOSCOPY;  Service: Endoscopy;  Laterality: N/A;   COLONOSCOPY WITH PROPOFOL N/A 02/07/2017   Procedure: COLONOSCOPY WITH PROPOFOL;  Surgeon: Christena Deem, MD;  Location: [facility] ENDOSCOPY;  Service: Endoscopy;  Laterality: N/A;   ESOPHAGOGASTRODUODENOSCOPY (EGD) WITH PROPOFOL N/A 11/23/2015   Procedure: ESOPHAGOGASTRODUODENOSCOPY (EGD) WITH PROPOFOL;  Surgeon: Scot Jun, MD;  Location: [place] ENDOSCOPY;  Service: Endoscopy;  Laterality: N/A;   TUBAL LIGATION     WISDOM TOOTH EXTRACTION     x 2    (Not in a hospital admission)  Social History   Socioeconomic History   Marital status: Widowed    Spouse name: Not on file   Number of children: Not on file   Years of education: Not on file   Highest education level: Not on file  Occupational History   Not on file  Tobacco Use   Smoking status: Former    Current packs/day: 0.00    Average packs/day: 0.8 packs/day for 40.0 years (30.0 ttl pk-yrs)  Types: Cigarettes    Start date: 02/28/1981    Quit date: 02/28/2021    Years since quitting: 2.0   Smokeless tobacco: Never  Vaping Use   Vaping status: Never Used  Substance and Sexual Activity   Alcohol use: No   Drug use: No   Sexual activity: Not Currently    Birth control/protection: Post-menopausal    Comment: widow since 2000  Other Topics  Concern   Not on file  Social History Narrative   Lives with daughter   Social Drivers of Health   Financial Resource Strain: Low Risk  (11/21/2022)   Received [facility]   Overall Financial Resource Strain (CARDIA)    Difficulty of Paying Living Expenses: Not hard at all  Food Insecurity: No Food Insecurity (11/21/2022)   Received from [facility]   Hunger Vital Sign    Worried About Running Out of Food in the Last Year: Never true    Ran Out of Food in the Last Year: Never true  Transportation Needs: No Transportation Needs (11/21/2022)   Received from [facility] - Transportation    In the past 12 months, has lack of transportation kept you from medical appointments or from getting medications?: No    Lack of Transportation (Non-Medical): No  Physical Activity: Not on file  Stress: Not on file  Social Connections: Not on file  Intimate Partner Violence: Not on file    Family History  Problem Relation Age of Onset   Arthritis Mother    Hyperlipidemia Mother    Hypertension Mother    Hyperlipidemia Sister    Hypertension Sister    Hypertension Daughter    Diabetes Sister    Hyperlipidemia Sister    Hypertension Sister    Hypertension Daughter    Hypertension Daughter    Breast cancer Neg Hx       Review of systems complete and found to be negative unless listed above      PHYSICAL EXAM  Alert and oriented x 3 Bradycardia noted.  No significant murmur No pedal edema No significant JVD No wheeze or crackles  Labs:   Lab Results  Component Value Date   WBC 6.8 03/26/2023   HGB 11.2 (L) 03/26/2023   HCT 35.8 (L) 03/26/2023   MCV 91.3 03/26/2023   PLT 195 03/26/2023    Recent Labs  Lab 03/26/23 1246  NA 142  K 3.5  CL 110  CO2 22  BUN 12  CREATININE 0.96  CALCIUM 8.0*  PROT 6.1*  BILITOT 0.6  ALKPHOS 58  ALT 14  AST 19  GLUCOSE 157*   No results found for: "CKTOTAL",  "CKMB", "CKMBINDEX", "TROPONINI" No results found for: "CHOL" No results found for: "HDL" No results found for: "LDLCALC" No results found for: "TRIG" No results found for: "CHOLHDL" No results found for: "LDLDIRECT"    Radiology: CT ABDOMEN PELVIS W CONTRAST Result Date: 03/19/2023 CLINICAL DATA:  Left upper quadrant abdominal pain, muscle cramping, intermittent for several months. EXAM: CT ABDOMEN AND PELVIS WITH CONTRAST TECHNIQUE: Multidetector CT imaging of the abdomen and pelvis was performed using the standard protocol following bolus administration of intravenous contrast. RADIATION DOSE REDUCTION: This exam was performed according to the departmental dose-optimization program which includes automated exposure control, adjustment of the mA and/or kV according to patient size and/or use of iterative reconstruction technique. CONTRAST:  OMNIPAQUE IOHEXOL 300 MG/ML  SOLN COMPARISON:  None Available. FINDINGS: Lower  chest: No acute abnormality. Hepatobiliary: No suspicious hepatic lesion. Pharyngeal cap on the gallbladder. No biliary ductal dilation. Pancreas: No pancreatic ductal dilation or evidence of acute inflammation. Spleen: No splenomegaly. Adrenals/Urinary Tract: Bilateral adrenal glands appear normal. No hydronephrosis. Kidneys demonstrate symmetric enhancement. Urinary bladder is unremarkable for degree of distension. Stomach/Bowel: No radiopaque enteric contrast material was administered. Stomach is unremarkable for degree of distension. No pathologic dilation of small or large bowel. Surgical clips along the cecum likely reflect appendectomy. Colonic diverticulosis without findings of acute diverticulitis. Vascular/Lymphatic: Aortic atherosclerosis. Normal caliber abdominal aorta. Smooth IVC contours. The portal, splenic and superior mesenteric veins are patent. No pathologically enlarged abdominal or pelvic lymph nodes. Reproductive: Uterus and bilateral adnexa are unremarkable.  Other: No significant abdominopelvic free fluid. No pneumoperitoneum. No walled off fluid collection. Musculoskeletal: Multilevel degenerative change of the spine with grade 1 L4 on L5 degenerative anterolisthesis. Asymmetric sclerosis of the right SI joint. IMPRESSION: 1. No acute abnormality in the abdomen or pelvis. 2. Colonic diverticulosis without findings of acute diverticulitis. 3. Asymmetric sclerosis of the right SI joint, which can be seen in the setting of sacroiliitis. 4.  Aortic Atherosclerosis (ICD10-I70.0). Electronically Signed   By: Maudry Mayhew M.D.   On: 03/19/2023 11:44    EKG: Complete heart block  ASSESSMENT AND PLAN:  Complete heart block Chronic left bundle branch block Nonischemic cardiomyopathy, LVEF proved to 50% on last echo 10/2022 CAD, hypertension, hyperlipidemia  Patient presented with bradycardia, no syncope or significant dizziness.  Noted to be in complete heart block with ventricular rate of mid to high 30s.  Blood pressure stable with systolic in 140s.  With known conduction disease in the form of left bundle branch block and nonischemic cardiomyopathy (has indication for beta blocker) who is now having complete heart block, recommend permanent pacemaker implantation.  Patient/daughter agreeable with plan.  Keep n.p.o. after midnight.  Complete bedrest.  Pacer pads at bedside.  Monitor in ICU. Avoid any AV nodal blocking agents. Echocardiogram pending to assess LVEF.  Would benefit from CRT device if LVEF less than 50%.  Signed: Kathryne Gin MD 03/26/2023, 3:04 PM

## 2023-03-26 NOTE — ED Notes (Signed)
 Advised nurse that patient has ready bed

## 2023-03-26 NOTE — ED Notes (Signed)
 Called lab to add on phosphorus from trop sent earlier.

## 2023-03-26 NOTE — ED Provider Notes (Addendum)
 [facility] Provider Note    Event Date/Time   First MD Initiated Contact with Patient 03/26/23 1235     (approximate)   History   No chief complaint on file.   HPI  Brenda Combs is a 78 y.o. female with a history of nonischemic cardiomyopathy as well as left bundle branch block CAD presents to the ER for evaluation of low heart rate.  States that she was feeling some sinus pressure earlier today denies any chest pain or pressure no shortness of breath.  Recently on some antibiotics for UTI checked her blood pressure but her machine cuff would not register.  Daughter came and checked it for her and found that her heart rate was 35 so she was brought to the ER.     Physical Exam   Triage Vital Signs: ED Triage Vitals  Encounter Vitals Group     BP 03/26/23 1244 (!) 175/58     Systolic BP Percentile --      Diastolic BP Percentile --      Pulse Rate 03/26/23 1239 (!) 38     Resp 03/26/23 1239 17     Temp 03/26/23 1239 97.9 F (36.6 C)     Temp Source 03/26/23 1239 Oral     SpO2 03/26/23 1239 100 %     Weight 03/26/23 1240 197 lb (89.4 kg)     Height 03/26/23 1240 5' (1.524 m)     Head Circumference --      Peak Flow --      Pain Score 03/26/23 1240 0     Pain Loc --      Pain Education --      Exclude from Growth Chart --     Most recent vital signs: Vitals:   03/26/23 1239 03/26/23 1244  BP:  (!) 175/58  Pulse: (!) 38   Resp: 17   Temp: 97.9 F (36.6 C)   SpO2: 100%      Constitutional: Alert  Eyes: Conjunctivae are normal.  Head: Atraumatic. Nose: No congestion/rhinnorhea. Mouth/Throat: Mucous membranes are moist.   Neck: Painless ROM.  Cardiovascular:   slow hr but Good peripheral circulation. Respiratory: Normal respiratory effort.  No retractions.  Gastrointestinal: Soft and nontender.  Musculoskeletal:  no deformity Neurologic:  MAE spontaneously. No gross focal neurologic deficits are appreciated.  Skin:  Skin is  warm, dry and intact. No rash noted. Psychiatric: Mood and affect are normal. Speech and behavior are normal.    ED Results / Procedures / Treatments   Labs (all labs ordered are listed, but only abnormal results are displayed) Labs Reviewed  CBC WITH DIFFERENTIAL/PLATELET - Abnormal; Notable for the following components:      Result Value   Hemoglobin 11.2 (*)    HCT 35.8 (*)    All other components within normal limits  COMPREHENSIVE METABOLIC PANEL - Abnormal; Notable for the following components:   Glucose, Bld 157 (*)    Calcium 8.0 (*)    Total Protein 6.1 (*)    All other components within normal limits  MAGNESIUM - Abnormal; Notable for the following components:   Magnesium 1.0 (*)    All other components within normal limits  TROPONIN I (HIGH SENSITIVITY)     EKG  ED ECG REPORT I, Willy Eddy, the attending physician, personally viewed and interpreted this ECG.   Date: 03/26/2023  EKG Time: 12:42  Rate: 40  Rhythm: complete heart block  Axis: left  Intervals: lafb  ST&T Change: nonspecific st abn    RADIOLOGY Please see ED Course for my review and interpretation.  I personally reviewed all radiographic images ordered to evaluate for the above acute complaints and reviewed radiology reports and findings.  These findings were personally discussed with the patient.  Please see medical record for radiology report.    PROCEDURES:  Critical Care performed: Yes, see critical care procedure note(s)  Procedures   MEDICATIONS ORDERED IN ED: Medications  magnesium sulfate IVPB 2 g 50 mL (has no administration in time range)  atropine injection 0.5 mg (has no administration in time range)     IMPRESSION / MDM / ASSESSMENT AND PLAN / ED COURSE  I reviewed the triage vital signs and the nursing notes.                              Differential diagnosis includes, but is not limited to, heart block, electrolyte abnormality, ACS, medication  effect  Patient presenting to the ER for evaluation of symptoms as described above.  Based on symptoms, risk factors and considered above differential, this presenting complaint could reflect a potentially life-threatening illness therefore the patient will be placed on continuous pulse oximetry and telemetry for monitoring.  Laboratory evaluation will be sent to evaluate for the above complaints.      Clinical Course as of 03/26/23 1355  Sun Mar 26, 2023  1329 Renal function normal.  Magnesium is low.  Will replete.  Will consult cardiology.  Will consult hospitalist for admission.  Remains well-perfused with normal mentation despite bradycardia and complete heart block.  Pacer pads in place she is to remain on cardiac monitor. [PR]    Clinical Course User Index [PR] Willy Eddy, MD     FINAL CLINICAL IMPRESSION(S) / ED DIAGNOSES   Final diagnoses:  Complete heart block (HCC)     Rx / DC Orders   ED Discharge Orders     None        Note:  This document was prepared using Dragon voice recognition software and may include unintentional dictation errors.    Willy Eddy, MD 03/26/23 1342    Willy Eddy, MD 03/26/23 [phone]

## 2023-03-26 NOTE — H&P (Addendum)
 History and Physical   Brenda Combs ZOX:[id number] DOB: 1945/10/16 DOA: 03/26/2023  PCP: Patrice Paradise, MD  Outpatient Specialists: Dr. Olevia Perches clinic cardiology Patient coming from: Home via EMS  I have personally briefly reviewed patient's old medical records in [facility] EMR.  Chief Concern: Low blood pressure, low heart rate  HPI: [name] is a 78 year old female with history of hypertension, heart failure, hyperlipidemia, GERD, who presents to the emergency department for chief concerns of low blood pressure and low heart rate  Vitals in the ED showed temperature of 97.9, respiration rate of 17, heart rate of 38, blood pressure 175/58, currently at bedside in the 150s over 60s, SpO2 of 100% on room air.  Serum sodium is 142, potassium 2.5, chloride 110, bicarb 22, BUN of 12, serum creatinine of 0.96, EGFR greater than 60, nonfasting blood glucose 157, WBC 6.8, hemoglobin 11.2, platelets of 195.  High sensitive troponin was 16.  Serum calcium level was 8.0.  ED treatment: Magnesium 2 g IV one-time dose, atropine 0.5 mg IV one-time dose.  EDP consulted cardiology who states that they will see the patient. ----------------------------- At bedside, patient was able to tell me her first and last name, age, location, current calendar year.  She reports she just did not feel very well today.  She reports she felt like her head was spinning and full.  She denies chest pain, shortness of breath, dysuria, hematuria, diarrhea, blood in her stool, swelling of her lower extremities, chest pressure, syncope, loss of consciousness.  Social history: He lives at home.  She infrequently smokes tobacco products, 2 cigarettes/day.  She denies EtOH and recreational drug use.  She is retired.  ROS: Constitutional: no weight change, no fever ENT/Mouth: no sore throat, no rhinorrhea Eyes: no eye pain, no vision changes Cardiovascular: no chest pain, no dyspnea,  no  edema, no palpitations Respiratory: no cough, no sputum, no wheezing Gastrointestinal: no nausea, no vomiting, no diarrhea, no constipation Genitourinary: no urinary incontinence, no dysuria, no hematuria Musculoskeletal: no arthralgias, no myalgias Skin: no skin lesions, no pruritus, Neuro: + weakness, no loss of consciousness, no syncope Psych: no anxiety, no depression, + decrease appetite Heme/Lymph: no bruising, no bleeding  ED Course: Discussed with EDP, patient requiring hospitalization for chief concerns of complete heart block.  Assessment/Plan  Principal Problem:   Symptomatic sinus bradycardia Active Problems:   Hypertension   Hypercholesteremia   Nicotine dependence   Coronary artery disease involving native coronary artery of native heart   GERD (gastroesophageal reflux disease)   Malignant neoplasm of upper-inner quadrant of left breast in female, estrogen receptor positive (HCC)   Hypocalcemia   Complete heart block (HCC)   Assessment and Plan:  * Symptomatic sinus bradycardia Suspect complete heart block Status post atropine 0.5 mg IV one-time dose per EDP Magnesium level was found to be decreased at 1.0, status post magnesium 2 g IV one-time dose per EDP Complete echo ordered on admission EDP has consulted cardiology, [facility] who states they will see the patient Epic order placed for cardiology, to [facility] clinic Telemetry N.p.o. after midnight in anticipation of permanent pacemaker placement  Hypocalcemia Mild; corrected sCa is 8.3 Calcium carbonate once ordered  Malignant neoplasm of upper-inner quadrant of left breast in female, estrogen receptor positive (HCC) Patient follows by oncology clinic, has been 2 years since radiation and surgery Patient was not on hormone therapy due to intolerance Patient was never on chemotherapy  Nicotine dependence Patient seen she  does not smoke every day and sometimes she smokes 2-3 times per  day She had 1 cigarette prior to coming to the ED today Patient states she is ready to quit now  Hypertension Hydralazine 5 mg IV every 4 hours as needed for SBP greater than 175, 5 days ordered  Chart reviewed.   DVT prophylaxis: Heparin 5000 units subcutaneous every 8 hours Code Status: Full code Diet: Heart healthy diet; n.p.o. after midnight in anticipation of cardiac pacemaker placement Family Communication: Updated daughter at bedside Disposition Plan: Pending clinical course Consults called: Cardiology, [facility] Admission status: Stepdown, inpatient  Past Medical History:  Diagnosis Date   Arthritis    Bilateral carotid artery stenosis    CAD (coronary artery disease)    GERD (gastroesophageal reflux disease)    Gout    Heart murmur    History of abnormal Pap smear    Hyperlipidemia    Hypertension    LBBB (left bundle branch block) 01/20/2021   a.) noted on preoperative ECG   NICM (nonischemic cardiomyopathy) (HCC) 02/15/2021   a.) TTE 02/15/2021: ED 35-40%; mod LV systolic dysfunction with LVH; mild panvalvular regurgitation. b.) Lexiscan 02/15/2021: EF 21%; global LV systolic dysfunction; no significant ischemia.   SOB (shortness of breath) on exertion    Past Surgical History:  Procedure Laterality Date   APPENDECTOMY     BREAST BIOPSY Left 01/07/2021   stereo bx calcs, coil marker, PAPILLARY LESION WITH ATYPIA- CALCIFICATIONS ARE PRESENT. 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 ENDOSCOPY;  Service: Endoscopy;  Laterality: N/A;   COLONOSCOPY WITH PROPOFOL N/A 02/07/2017   Procedure: COLONOSCOPY WITH PROPOFOL;  Surgeon: Christena Deem, MD;  Location: [facility] ENDOSCOPY;  Service: Endoscopy;  Laterality: N/A;   ESOPHAGOGASTRODUODENOSCOPY (EGD) WITH PROPOFOL N/A 11/23/2015   Procedure: ESOPHAGOGASTRODUODENOSCOPY (EGD) WITH PROPOFOL;  Surgeon: Scot Jun, MD;  Location: [facility] ENDOSCOPY;  Service: Endoscopy;  Laterality: N/A;   TUBAL LIGATION     WISDOM TOOTH EXTRACTION     x 2   Social History:  reports that she quit smoking about 2 years ago. Her smoking use included cigarettes. She started smoking about 42 years ago. She has a 30 pack-year smoking history. She has never used smokeless tobacco. She reports that she does not drink alcohol and does not use drugs.  Allergies  Allergen Reactions   Jardiance [Empagliflozin]     Abdominal pain   Augmentin [Amoxicillin-Pot Clavulanate] Diarrhea   Zyrtec [Cetirizine Hcl]     depression   Family History  Problem Relation Age of Onset   Arthritis Mother    Hyperlipidemia Mother    Hypertension Mother    Hyperlipidemia Sister    Hypertension Sister    Hypertension Daughter    Diabetes Sister    Hyperlipidemia Sister    Hypertension Sister    Hypertension Daughter    Hypertension Daughter    Breast cancer Neg Hx    Family history:  Family history reviewed and not pertinent.  Prior to Admission medications   Medication Sig Start Date End Date Taking? Authorizing Provider  allopurinol (ZYLOPRIM) 100 MG tablet Take 100 mg by mouth daily. 04/15/15  Yes [provider]  amLODipine (NORVASC) 5 MG tablet Take 7.5 mg by mouth daily.   Yes [provider]  aspirin 81 MG tablet Take 81 mg by mouth daily.   Yes [provider]  atorvastatin (LIPITOR) 40 MG tablet  Take 40 mg by mouth daily. 04/15/15  Yes [provider]  Cyanocobalamin (VITAMIN B-12) 500 MCG SUBL Place 1 tablet under the tongue daily at 6 (six) AM.   Yes [provider]  cyclobenzaprine (FLEXERIL) 5 MG tablet Take 5 mg by mouth 2 (two) times daily as needed for muscle spasms. 12/15/20  Yes [provider]  ENTRESTO 49-51 MG Take 1 tablet by mouth 2 (two) times daily.   Yes [provider]  labetalol (NORMODYNE) 200 MG tablet Take 200 mg by mouth 2 (two) times daily. 06/07/22  Yes [provider]  magnesium oxide (MAG-OX) 400 (240 Mg) MG tablet Take 400 mg by mouth daily.   Yes [provider]  naproxen (NAPROSYN) 500 MG tablet Take 500 mg by mouth 2 (two) times daily as needed for pain. 11/11/20  Yes [provider]  omeprazole (PRILOSEC) 40 MG capsule Take 40 mg by mouth 2 (two) times daily. 04/15/15  Yes [provider]  spironolactone (ALDACTONE) 25 MG tablet Take 25 mg by mouth daily. 10/09/15  Yes [provider]  carvedilol (COREG) 6.25 MG tablet Take 6.25 mg by mouth 2 (two) times daily. Patient not taking: Reported on 03/26/2023 02/23/21   [provider]  diazepam (VALIUM) 5 MG tablet 1-2 30 minutes before ESI Patient not taking: Reported on 03/26/2023 05/02/22   [provider]  fluticasone (FLONASE) 50 MCG/ACT nasal spray Place 1 spray into both nostrils daily. 04/15/15   [provider]  meloxicam (MOBIC) 7.5 MG tablet Take by mouth. Patient not taking: Reported on 03/26/2023 05/17/22 05/17/23  [provider]  olmesartan (BENICAR) 40 MG tablet Take by mouth. Patient not taking: Reported on 03/26/2023 04/15/15   [provider]  OYSCO 500 + D 500-5 MG-MCG TABS Take 2 tablets by mouth daily. Patient not taking: Reported on 03/26/2023 04/13/22   [provider]   Physical Exam: Vitals:   03/26/23 1430 03/26/23 1500 03/26/23 1530 03/26/23 1613  BP:  (!) 142/65  (!) 142/69 (!) 181/50  Pulse: (!) 37 (!) 35 (!) 36 (!) 37  Resp: 16 17 17 15   Temp:    98.3 F (36.8 C)  TempSrc:    Oral  SpO2: 99% 99% 99% 98%  Weight:    91.5 kg  Height:    5' (1.524 m)   Constitutional: appears age-appropriate, frail, NAD, calm Eyes: PERRL, lids and conjunctivae normal ENMT: Mucous membranes are moist. Posterior pharynx clear of any exudate or lesions. Age-appropriate dentition. Neck: normal, supple, no masses, no thyromegaly Respiratory: clear to auscultation bilaterally, no wheezing, no crackles. Normal respiratory effort. No accessory muscle use.  Cardiovascular: Bradycardia, no murmurs / rubs / gallops. No extremity edema. 2+ pedal pulses. No carotid bruits.  Abdomen: Obese abdomen, no tenderness, no masses palpated, no hepatosplenomegaly. Bowel sounds positive.  Musculoskeletal: no clubbing / cyanosis. No joint deformity upper and lower extremities. Good ROM, no contractures, no atrophy. Normal muscle tone.  Skin: no rashes, lesions, ulcers. No induration Neurologic: Sensation  intact. Strength 5/5 in all 4.  Psychiatric: Normal judgment and insight. Alert and oriented x 3. Normal mood.   EKG: independently reviewed, showing sinus bradycardia with rate of 38, QTc 438, complete AV block  Chest x-ray on Admission: Not indicated at this time  Labs on Admission: I have personally reviewed following labs CBC: Recent Labs  Lab 03/26/23 1246  WBC 6.8  NEUTROABS 4.8  HGB 11.2*  HCT 35.8*  MCV 91.3  PLT 195   Basic Metabolic Panel: Recent Labs  Lab 03/26/23 1246  NA 142  K 3.5  CL 110  CO2 22  GLUCOSE 157*  BUN 12  CREATININE 0.96  CALCIUM 8.0*  MG 1.0*  PHOS 2.5   GFR: Estimated Creatinine Clearance: 50.3 mL/min (by C-G formula based on SCr of 0.96 mg/dL).  Liver Function Tests: Recent Labs  Lab 03/26/23 1246  AST 19  ALT 14  ALKPHOS 58  BILITOT 0.6  PROT 6.1*  ALBUMIN 3.6   This document was prepared using Dragon Voice Recognition  software and may include unintentional dictation errors.  Dr. Sedalia Muta [facility]  If 7PM-7AM, please contact overnight-coverage provider If 7AM-7PM, please contact day attending provider [email or web address]  03/26/2023, 5:21 PM

## 2023-03-26 NOTE — ED Notes (Signed)
 Pt HR is still currently at 35bpm.

## 2023-03-26 NOTE — ED Triage Notes (Signed)
 Pt in with [facility] from home. Pt states she wasn't feeling right. States her head felt like it was in a bubble. Called daughter and she got a HR of 38. EMS got a HR of 38 and BP's of 90's/50's. Pt did just get off of 7 days of antibiotic's for a UTI. HR currently 38. Pt A&O x4.   100% on RA BS-200

## 2023-03-26 NOTE — Assessment & Plan Note (Signed)
 Hydralazine 5 mg IV every 4 hours as needed for SBP greater than 175, 5 days ordered

## 2023-03-26 NOTE — Assessment & Plan Note (Signed)
 Patient follows by oncology clinic, has been 2 years since radiation and surgery Patient was not on hormone therapy due to intolerance Patient was never on chemotherapy

## 2023-03-27 ENCOUNTER — Encounter
Admission: EM | Disposition: A | Discharge status: Home / Self Care | Payer: Self-pay | Source: Home / Self Care | Attending: Internal Medicine

## 2023-03-27 ENCOUNTER — Encounter: Payer: Self-pay | Admitting: Cardiology

## 2023-03-27 ENCOUNTER — Inpatient Hospital Stay
Admit: 2023-03-27 | Discharge: 2023-03-27 | Disposition: A | Discharge status: Home / Self Care | Payer: Medicare PPO | Attending: Internal Medicine | Admitting: Internal Medicine

## 2023-03-27 DIAGNOSIS — I10 Essential (primary) hypertension: Secondary | ICD-10-CM

## 2023-03-27 DIAGNOSIS — C50212 Malignant neoplasm of upper-inner quadrant of left female breast: Secondary | ICD-10-CM

## 2023-03-27 DIAGNOSIS — R001 Bradycardia, unspecified: Secondary | ICD-10-CM | POA: Diagnosis not present

## 2023-03-27 DIAGNOSIS — I442 Atrioventricular block, complete: Secondary | ICD-10-CM

## 2023-03-27 DIAGNOSIS — Z950 Presence of cardiac pacemaker: Secondary | ICD-10-CM

## 2023-03-27 DIAGNOSIS — Z170 Estrogen receptor positive status [ER+]: Secondary | ICD-10-CM

## 2023-03-27 DIAGNOSIS — E7800 Pure hypercholesterolemia, unspecified: Secondary | ICD-10-CM | POA: Diagnosis not present

## 2023-03-27 HISTORY — PX: PACEMAKER IMPLANT: EP1218

## 2023-03-27 HISTORY — PX: TEMPORARY PACEMAKER: CATH118268

## 2023-03-27 HISTORY — DX: Presence of cardiac pacemaker: Z95.0

## 2023-03-27 LAB — BASIC METABOLIC PANEL
Anion gap: 13 (ref 5–15)
BUN: 12 mg/dL (ref 8–23)
CO2: 24 mmol/L (ref 22–32)
Calcium: 9.5 mg/dL (ref 8.9–10.3)
Chloride: 107 mmol/L (ref 98–111)
Creatinine, Ser: 0.93 mg/dL (ref 0.44–1.00)
GFR, Estimated: >60 mL/min (ref >60)
Glucose, Bld: 106 mg/dL — ABNORMAL HIGH (ref 70–99)
Potassium: 3.9 mmol/L (ref 3.5–5.1)
Sodium: 144 mmol/L (ref 135–145)

## 2023-03-27 LAB — CBC
HCT: 35 % — ABNORMAL LOW (ref 36.0–46.0)
Hemoglobin: 11.1 g/dL — ABNORMAL LOW (ref 12.0–15.0)
MCH: 28.5 pg (ref 26.0–34.0)
MCHC: 31.7 g/dL (ref 30.0–36.0)
MCV: 90 fL (ref 80.0–100.0)
Platelets: 207 10*3/uL (ref 150–400)
RBC: 3.89 MIL/uL (ref 3.87–5.11)
RDW: 13.4 % (ref 11.5–15.5)
WBC: 7.8 10*3/uL (ref 4.0–10.5)
nRBC: 0 % (ref 0.0–0.2)

## 2023-03-27 LAB — MAGNESIUM: Magnesium: 2.2 mg/dL (ref 1.7–2.4)

## 2023-03-27 SURGERY — PACEMAKER IMPLANT
Anesthesia: Moderate Sedation

## 2023-03-27 MED ORDER — HEPARIN (PORCINE) IN NACL 1000-0.9 UT/500ML-% IV SOLN
INTRAVENOUS | Status: AC
  Filled 2023-03-27: qty 500

## 2023-03-27 MED ORDER — IOHEXOL 300 MG/ML  SOLN
INTRAMUSCULAR | Status: DC | PRN
  Administered 2023-03-27: 15 mL

## 2023-03-27 MED ORDER — HEPARIN (PORCINE) IN NACL 2000-0.9 UNIT/L-% IV SOLN
INTRAVENOUS | Status: DC | PRN
  Administered 2023-03-27: 1000 mL

## 2023-03-27 MED ORDER — CHLORHEXIDINE GLUCONATE 4 % EX SOLN: 60.0000 mL | Freq: Once | CUTANEOUS | Status: DC

## 2023-03-27 MED ORDER — FENTANYL CITRATE (PF) 100 MCG/2ML IJ SOLN
INTRAMUSCULAR | Status: DC | PRN
  Administered 2023-03-27: 12.5 ug via INTRAVENOUS
  Administered 2023-03-27: 25 ug via INTRAVENOUS

## 2023-03-27 MED ORDER — SODIUM CHLORIDE 0.9% FLUSH
3.0000 mL | Freq: Two times a day (BID) | INTRAVENOUS | Status: DC
  Administered 2023-03-27 (×2): 3 mL via INTRAVENOUS

## 2023-03-27 MED ORDER — MIDAZOLAM HCL 2 MG/2ML IJ SOLN
INTRAMUSCULAR | Status: DC | PRN
  Administered 2023-03-27: 1 mg via INTRAVENOUS
  Administered 2023-03-27: .5 mg via INTRAVENOUS

## 2023-03-27 MED ORDER — HEPARIN SODIUM (PORCINE) 1000 UNIT/ML IJ SOLN
INTRAMUSCULAR | Status: DC | PRN
  Administered 2023-03-27: 5000 [IU] via INTRAVENOUS

## 2023-03-27 MED ORDER — MIDAZOLAM HCL 2 MG/2ML IJ SOLN
INTRAMUSCULAR | Status: AC
  Filled 2023-03-27: qty 2

## 2023-03-27 MED ORDER — SODIUM CHLORIDE 0.9 % IV SOLN: 250.0000 mL | INTRAVENOUS | Status: AC | PRN

## 2023-03-27 MED ORDER — HEPARIN SODIUM (PORCINE) 1000 UNIT/ML IJ SOLN
INTRAMUSCULAR | Status: AC
  Filled 2023-03-27: qty 10

## 2023-03-27 MED ORDER — LIDOCAINE HCL (PF) 1 % IJ SOLN
INTRAMUSCULAR | Status: DC | PRN
  Administered 2023-03-27: 2 mL

## 2023-03-27 MED ORDER — ATORVASTATIN CALCIUM 20 MG PO TABS
40.0000 mg | ORAL_TABLET | Freq: Every day | ORAL | Status: DC
  Administered 2023-03-27 – 2023-03-28 (×2): 40 mg via ORAL
  Filled 2023-03-27 (×2): qty 2

## 2023-03-27 MED ORDER — FENTANYL CITRATE (PF) 100 MCG/2ML IJ SOLN
INTRAMUSCULAR | Status: AC
  Filled 2023-03-27: qty 2

## 2023-03-27 MED ORDER — ACETAMINOPHEN 325 MG PO TABS: 650.0000 mg | ORAL_TABLET | ORAL | Status: DC | PRN

## 2023-03-27 MED ORDER — CHLORHEXIDINE GLUCONATE CLOTH 2 % EX PADS
6.0000 | MEDICATED_PAD | Freq: Every day | CUTANEOUS | Status: DC
  Administered 2023-03-28: 6 via TOPICAL

## 2023-03-27 MED ORDER — PNEUMOCOCCAL 20-VAL CONJ VACC 0.5 ML IM SUSY
0.5000 mL | PREFILLED_SYRINGE | INTRAMUSCULAR | Status: AC
  Administered 2023-03-28: 0.5 mL via INTRAMUSCULAR
  Filled 2023-03-27: qty 0.5

## 2023-03-27 MED ORDER — SODIUM CHLORIDE 0.9% FLUSH
3.0000 mL | Freq: Two times a day (BID) | INTRAVENOUS | Status: DC
Start: 2023-03-27 — End: 2023-03-27

## 2023-03-27 MED ORDER — SODIUM CHLORIDE 0.9% FLUSH: 3.0000 mL | INTRAVENOUS | Status: DC | PRN

## 2023-03-27 MED ORDER — SPIRONOLACTONE 25 MG PO TABS
25.0000 mg | ORAL_TABLET | Freq: Every day | ORAL | Status: DC
  Administered 2023-03-27 – 2023-03-28 (×2): 25 mg via ORAL
  Filled 2023-03-27 (×2): qty 1

## 2023-03-27 MED ORDER — SODIUM CHLORIDE 0.9 % IV SOLN
80.0000 mg | INTRAVENOUS | Status: DC
  Filled 2023-03-27: qty 2

## 2023-03-27 MED ORDER — CEFAZOLIN SODIUM-DEXTROSE 2-4 GM/100ML-% IV SOLN: 2.0000 g | INTRAVENOUS | Status: DC

## 2023-03-27 MED ORDER — SACUBITRIL-VALSARTAN 49-51 MG PO TABS
1.0000 | ORAL_TABLET | Freq: Two times a day (BID) | ORAL | Status: DC
  Administered 2023-03-27 – 2023-03-28 (×3): 1 via ORAL
  Filled 2023-03-27 (×3): qty 1

## 2023-03-27 SURGICAL SUPPLY — 21 items
CABLE ADAPT PACING TEMP 12FT (ADAPTER) IMPLANT
DILATOR VESSEL 38 20CM 12FR (INTRODUCER) IMPLANT
DILATOR VESSEL 38 20CM 14FR (INTRODUCER) IMPLANT
DILATOR VESSEL 38 20CM 18FR (INTRODUCER) IMPLANT
DILATOR VESSEL 38 20CM 8FR (INTRODUCER) IMPLANT
DRAPE BRACHIAL (DRAPES) IMPLANT
KIT SYRINGE INJ CVI SPIKEX1 (MISCELLANEOUS) IMPLANT
MICRA INTRODUCER SHEATH (SHEATH) ×1 IMPLANT
NDL PERC 18GX7CM (NEEDLE) IMPLANT
NEEDLE PERC 18GX7CM (NEEDLE) ×2 IMPLANT
PACEMAKER LEADLESS AV2 MICRA (Pacemaker) IMPLANT
PAD ELECT DEFIB RADIOL ZOLL (MISCELLANEOUS) IMPLANT
PROTECTION STATION PRESSURIZED (MISCELLANEOUS) ×1 IMPLANT
SHEATH AVANTI 6FR X 11CM (SHEATH) IMPLANT
SHEATH AVANTI 7FRX11 (SHEATH) IMPLANT
SHEATH INTRODUCER MICRA (SHEATH) IMPLANT
STATION PROTECTION PRESSURIZED (MISCELLANEOUS) IMPLANT
SUT SILK 0 FSL (SUTURE) IMPLANT
TRAY PACEMAKER INSERTION (PACKS) ×1 IMPLANT
WIRE AMPLATZ SS-J .035X180CM (WIRE) IMPLANT
WIRE PACING TEMP ST TIP 5 (CATHETERS) IMPLANT

## 2023-03-27 NOTE — Plan of Care (Signed)

## 2023-03-27 NOTE — Plan of Care (Addendum)
 The patient remains on [facility] ICU/SDU at time of writing. The patient is s/p pace maker placement. The patient is no no active infusions, no supplemental O2, and denies chest pain. and/or shortness of breath. The patient's admission profile is completed overnight by this RN.    Problem: Education: Goal: Knowledge of General Education information will improve Description: Including pain rating scale, medication(s)/side effects and non-pharmacologic comfort measures Outcome: Progressing   Problem: Health Behavior/Discharge Planning: Goal: Ability to manage health-related needs will improve Outcome: Progressing   Problem: Clinical Measurements: Goal: Ability to maintain clinical measurements within normal limits will improve Outcome: Progressing Goal: Will remain free from infection Outcome: Progressing Goal: Diagnostic test results will improve Outcome: Progressing Goal: Respiratory complications will improve Outcome: Progressing Goal: Cardiovascular complication will be avoided Outcome: Progressing   Problem: Activity: Goal: Risk for activity intolerance will decrease Outcome: Progressing   Problem: Nutrition: Goal: Adequate nutrition will be maintained Outcome: Progressing   Problem: Coping: Goal: Level of anxiety will decrease Outcome: Progressing   Problem: Elimination: Goal: Will not experience complications related to bowel motility Outcome: Progressing Goal: Will not experience complications related to urinary retention Outcome: Progressing   Problem: Pain Managment: Goal: General experience of comfort will improve and/or be controlled Outcome: Progressing   Problem: Safety: Goal: Ability to remain free from injury will improve Outcome: Progressing   Problem: Skin Integrity: Goal: Risk for impaired skin integrity will decrease Outcome: Progressing   Problem: Education: Goal: Knowledge of cardiac device and self-care will improve Outcome: Progressing Goal:  Ability to safely manage health related needs after discharge will improve Outcome: Progressing Goal: Individualized Educational Video(s) Outcome: Progressing   Problem: Cardiac: Goal: Ability to achieve and maintain adequate cardiopulmonary perfusion will improve Outcome: Progressing

## 2023-03-27 NOTE — Progress Notes (Signed)
 Unable to release orders. Pre procedure orders completed with exception to consent form. Per pt, provider has not explain the procedure.

## 2023-03-27 NOTE — Progress Notes (Signed)
 [facility] CARDIOLOGY PROGRESS NOTE   Patient ID: Brenda Combs MRN: [id number] DOB/AGE: May 01, 1945 78 y.o.  Admit date: 03/26/2023 Referring Physician Dr. Roxan Hockey Primary Physician Merlinda Frederick, Merleen Milliner, MD  Primary Cardiologist Minda Ditto, [place] Reason for Consultation complete heart block  HPI: Brenda Combs is a 78 y.o. female with a past medical history of nonischemic cardiomyopathy with global LV dysfunction EF 35%, coronary artery disease by CT scan, hypertension, hyperlipidemia who presented to the ED on 03/26/2023 for bradycardia.  Found to be in complete heart block upon arrival to the ED.  Cardiology was consulted for further evaluation.  Interval History:  -Patient seen and examined this morning after Micra leadless pacemaker implantation.  Feeling well overall without any complaints. -Denies any chest pain, shortness of breath, lightheadedness/dizziness. -Pacemaker appears to be functioning appropriately, BP slightly elevated.  Review of systems complete and found to be negative unless listed above    Vitals:   03/27/23 0945 03/27/23 1000 03/27/23 1046 03/27/23 1100  BP: (!) 149/67 (!) 146/68 125/65 133/70  Pulse: 86 82 81 85  Resp: 15 (!) 23 20 (!) 21  Temp:      TempSrc:      SpO2: 98% 99% 100% 100%  Weight:      Height:         Intake/Output Summary (Last 24 hours) at 03/27/2023 1106 Last data filed at 03/27/2023 0654 Gross per 24 hour  Intake 268.94 ml  Output 450 ml  Net -181.06 ml     PHYSICAL EXAM General: Well-appearing female, well nourished, in no acute distress. HEENT: Normocephalic and atraumatic. Neck: No JVD.  Lungs: Normal respiratory effort on room air. Clear bilaterally to auscultation. No wheezes, crackles, rhonchi.  Heart: HRRR. Normal S1 and S2 without gallops or murmurs. Radial & DP pulses 2+ bilaterally. Abdomen: Non-distended appearing.  Msk: Normal strength and tone for age. Extremities: No clubbing, cyanosis or edema.    Neuro: Alert and oriented X 3. Psych: Mood appropriate, affect congruent.    LABS: Basic Metabolic Panel: Recent Labs    03/26/23 1246 03/26/23 1822 03/27/23 0333  NA 142  --  144  K 3.5  --  3.9  CL 110  --  107  CO2 22  --  24  GLUCOSE 157*  --  106*  BUN 12  --  12  CREATININE 0.96  --  0.93  CALCIUM 8.0*  --  9.5  MG 1.0* 1.6* 2.2  PHOS 2.5  --   --    Liver Function Tests: Recent Labs    03/26/23 1246  AST 19  ALT 14  ALKPHOS 58  BILITOT 0.6  PROT 6.1*  ALBUMIN 3.6   No results for input(s): "LIPASE", "AMYLASE" in the last 72 hours. CBC: Recent Labs    03/26/23 1246 03/27/23 0333  WBC 6.8 7.8  NEUTROABS 4.8  --   HGB 11.2* 11.1*  HCT 35.8* 35.0*  MCV 91.3 90.0  PLT 195 207   Cardiac Enzymes: Recent Labs    03/26/23 1246 03/26/23 1412  TROPONINIHS 16 16   BNP: No results for input(s): "BNP" in the last 72 hours. D-Dimer: No results for input(s): "DDIMER" in the last 72 hours. Hemoglobin A1C: No results for input(s): "HGBA1C" in the last 72 hours. Fasting Lipid Panel: No results for input(s): "CHOL", "HDL", "LDLCALC", "TRIG", "CHOLHDL", "LDLDIRECT" in the last 72 hours. Thyroid Function Tests: No results for input(s): "TSH", "T4TOTAL", "T3FREE", "THYROIDAB" in the last 72 hours.  Invalid input(s): "FREET3" Anemia  Panel: No results for input(s): "VITAMINB12", "FOLATE", "FERRITIN", "TIBC", "IRON", "RETICCTPCT" in the last 72 hours.  EP PPM/ICD IMPLANT Result Date: 03/27/2023 Successful Micra AV leadless pacemaker implantation   CARDIAC CATHETERIZATION Result Date: 03/27/2023 Successful Micra AV leadless pacemaker implantation    ECHO ordered  TELEMETRY reviewed by me 03/27/23: Ventricular pacing rate 90s  EKG reviewed by me 03/27/23: Third-degree heart block rate 38 bpm  DATA reviewed by me 03/27/23: last 24h vitals tele labs imaging I/O, hospitalist progress note  Principal Problem:   Symptomatic sinus bradycardia Active  Problems:   Hypertension   Hypercholesteremia   Nicotine dependence   Coronary artery disease involving native coronary artery of native heart   GERD (gastroesophageal reflux disease)   Malignant neoplasm of upper-inner quadrant of left breast in female, estrogen receptor positive (HCC)   Hypocalcemia   Complete heart block (HCC)    ASSESSMENT AND PLAN: [name] is a 78 y.o. female with a past medical history of nonischemic cardiomyopathy with global LV dysfunction EF 35%, coronary artery disease by CT scan, hypertension, hyperlipidemia who presented to the ED on 03/26/2023 for bradycardia.  Found to be in complete heart block upon arrival to the ED.  Cardiology was consulted for further evaluation.  # Complete heart block # Chronic LBBB # Non-ischemic cardiomyopathy Patient with history of nonischemic cardiomyopathy, chronic left bundle branch block, nonobstructive CAD presented with bradycardia.  Had not had any dizziness or syncope.  Heart rate was in the 30s and she was in complete heart block.  S/p Micra leadless pacemaker implantation this morning 03/27/2023. -Echo ordered. -Resume home Entresto 49-51 mg twice daily, spironolactone 25 mg daily. -Continue aspirin 81 mg daily.  Resume home atorvastatin 40 mg daily. -Will plan to remove suture from right groin access site tomorrow morning 03/28/2023.  Patient will likely be stable for discharge after this.  This patient's case was discussed and created with Dr. Darrold Junker and he is in agreement.  Signed:  Gale Journey, PA-C  03/27/2023, 11:06 AM [facility] Cardiology

## 2023-03-27 NOTE — Discharge Instructions (Signed)
 Please avoid showering until 03/30/2023 and do not submerge yourself in water (baths, oceans, swimming pools) for the next week. If your bandage gets wet or starts to fall off, replace it with another piece of gauze and the Tegaderm (clear bandage I provided). You should try to keep it covered for a week. You will follow up with Minda Ditto, PA in the office in about 1 week. If you have bleeding from the site, lie down and apply firm pressure for 20 minutes, if it continues to bleed, call our office [phone]). Avoid heavy lifting, squatting (other than sitting) and strenuous activity for 1 week. You will get a call from [employer] to set up your pacemaker and the [employer] device. You do not need to bring this device with you to appointments. It is used to monitor your pacemaker from home.

## 2023-03-27 NOTE — Progress Notes (Signed)
 Progress Note   [name] WUJ:[id number] DOB: 1945/10/30 DOA: 03/26/2023     1 DOS: the patient was seen and examined on 03/27/2023   Brief hospital course: Brenda Combs is a 78 year old female with history of hypertension, heart failure, hyperlipidemia, GERD, who presents to the emergency department for chief concerns of low blood pressure and low heart rate  Patient is admitted to the hospitalist service for complete heart block with cardiology consultation and plan to place pacemaker  Assessment and Plan: * Symptomatic sinus bradycardia Complete heart block Patient had permanent pacemaker placement today morning. Continue telemetry monitoring. Patient denies any chest pain, shortness of breath or palpitations. Close monitoring in ICU for overnight. Avoid AV nodal blocking agents. Echocardiogram pending.  Nonischemic cardiomyopathy last echo 9/24. Denies any chest discomfort. Continue aspirin, statin therapy. Continue Entresto, Aldactone therapy. Coreg on hold at this time.  Hypomagnesemia: Replacement ordered. Continue to monitor daily electrolytes.  Malignant neoplasm of upper-inner quadrant of left breast in female, estrogen receptor positive (HCC) Patient follows by oncology clinic, has been 2 years since radiation and surgery.  Nicotine dependence Encouraged patient to quit smoking. Nicotine patches offered.  Hypertension Patient restarted on Entresto, spironolactone therapy. IV hydralazine as needed ordered. Will hold Coreg for now.  Continue to monitor blood pressure closely.  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 Diet, exercise and weight reduction advised.     Out of bed to chair. Incentive spirometry. Nursing supportive care. Fall, aspiration precautions. DVT prophylaxis   Code Status: Full Code Subjective: Patient is seen and examined today after pacemaker placement.  She denies any complaints at this time.  Eating fair.  Did not get  out of bed.  Family at bedside.  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 Lung - Clear, no rales, rhonchi, wheezes. Heart - S1, S2 heard, paced rhythm, trace pedal edema. Abdomen - Soft, non tender, bowel sounds good Neuro - Alert, awake and oriented x 3, non focal exam. Skin - Warm and dry.  Data Reviewed:      Latest Ref Rng & Units 03/27/2023    3:33 AM 03/26/2023   12:46 PM 12/15/2022    9:49 AM  CBC  WBC 4.0 - 10.5 K/uL 7.8  6.8  7.4   Hemoglobin 12.0 - 15.0 g/dL 95.6  21.3  08.6   Hematocrit 36.0 - 46.0 % 35.0  35.8  39.1   Platelets 150 - 400 K/uL 207  195  201       Latest Ref Rng & Units 03/27/2023    3:33 AM 03/26/2023   12:46 PM 12/15/2022    9:49 AM  BMP  Glucose 70 - 99 mg/dL 578  469  629   BUN 8 - 23 mg/dL 12  12  11    Creatinine 0.44 - 1.00 mg/dL 5.28  4.13  2.44   Sodium 135 - 145 mmol/L 144  142  141   Potassium 3.5 - 5.1 mmol/L 3.9  3.5  3.6   Chloride 98 - 111  mmol/L 107  110  102   CO2 22 - 32 mmol/L 24  22  27    Calcium 8.9 - 10.3 mg/dL 9.5  8.0  9.3    EP PPM/ICD IMPLANT Result Date: 03/27/2023 Successful Micra AV leadless pacemaker implantation   CARDIAC CATHETERIZATION Result Date: 03/27/2023 Successful Micra AV leadless pacemaker implantation   Family Communication: Discussed with family at bedside they understand and agree. All questions answereed.  Disposition: Status is: Inpatient Remains inpatient appropriate because: s/p pacemaker, need tele  Planned Discharge Destination: Home     Time spent: 39 minutes  Author: Marcelino Duster, MD 03/27/2023 5:07 PM Secure chat 7am to 7pm For  on call review [email or web address].

## 2023-03-27 NOTE — Progress Notes (Signed)
 Pt is going down for procedure, daughter at bedside.

## 2023-03-27 NOTE — Progress Notes (Signed)
*  PRELIMINARY RESULTS* Echocardiogram 2D Echocardiogram has been performed.  Brenda Combs 03/27/2023, 2:18 PM

## 2023-03-28 ENCOUNTER — Other Ambulatory Visit: Payer: Self-pay

## 2023-03-28 DIAGNOSIS — Z23 Encounter for immunization: Secondary | ICD-10-CM | POA: Diagnosis present

## 2023-03-28 DIAGNOSIS — C50212 Malignant neoplasm of upper-inner quadrant of left female breast: Secondary | ICD-10-CM | POA: Diagnosis not present

## 2023-03-28 DIAGNOSIS — F17210 Nicotine dependence, cigarettes, uncomplicated: Secondary | ICD-10-CM

## 2023-03-28 DIAGNOSIS — I442 Atrioventricular block, complete: Secondary | ICD-10-CM | POA: Diagnosis not present

## 2023-03-28 DIAGNOSIS — Z87891 Personal history of nicotine dependence: Secondary | ICD-10-CM

## 2023-03-28 DIAGNOSIS — R001 Bradycardia, unspecified: Secondary | ICD-10-CM | POA: Diagnosis not present

## 2023-03-28 DIAGNOSIS — Z122 Encounter for screening for malignant neoplasm of respiratory organs: Secondary | ICD-10-CM

## 2023-03-28 LAB — CBC
HCT: 34.6 % — ABNORMAL LOW (ref 36.0–46.0)
Hemoglobin: 11.1 g/dL — ABNORMAL LOW (ref 12.0–15.0)
MCH: 28.2 pg (ref 26.0–34.0)
MCHC: 32.1 g/dL (ref 30.0–36.0)
MCV: 88 fL (ref 80.0–100.0)
Platelets: 184 10*3/uL (ref 150–400)
RBC: 3.93 MIL/uL (ref 3.87–5.11)
RDW: 13.2 % (ref 11.5–15.5)
WBC: 7.6 10*3/uL (ref 4.0–10.5)
nRBC: 0 % (ref 0.0–0.2)

## 2023-03-28 LAB — BASIC METABOLIC PANEL
Anion gap: 10 (ref 5–15)
BUN: 13 mg/dL (ref 8–23)
CO2: 24 mmol/L (ref 22–32)
Calcium: 9.4 mg/dL (ref 8.9–10.3)
Chloride: 105 mmol/L (ref 98–111)
Creatinine, Ser: 1.01 mg/dL — ABNORMAL HIGH (ref 0.44–1.00)
GFR, Estimated: 58 mL/min — ABNORMAL LOW (ref >60)
Glucose, Bld: 144 mg/dL — ABNORMAL HIGH (ref 70–99)
Potassium: 3.6 mmol/L (ref 3.5–5.1)
Sodium: 139 mmol/L (ref 135–145)

## 2023-03-28 MED ORDER — LABETALOL HCL 200 MG PO TABS
200.0000 mg | ORAL_TABLET | Freq: Two times a day (BID) | ORAL | Status: DC
  Administered 2023-03-28: 200 mg via ORAL
  Filled 2023-03-28: qty 1

## 2023-03-28 NOTE — Progress Notes (Signed)
 Pt discharged home via daughter driving, Aox4, vitals WNL, no pain, Sites clean, dry  and intact, all lines removed, all belongings taken with patient which was just clothes.

## 2023-03-28 NOTE — Progress Notes (Signed)
 [facility] CARDIOLOGY PROGRESS NOTE   Patient ID: Brenda Combs MRN: [id number] DOB/AGE: [birth year] 77 y.o.  Admit date: 03/26/2023 Referring Physician Dr. Roxan Hockey Primary Physician Merlinda Frederick, Merleen Milliner, MD  Primary Cardiologist Minda Ditto, [place] Reason for Consultation complete heart block  HPI: Brenda Combs is a 78 y.o. female with a past medical history of nonischemic cardiomyopathy with global LV dysfunction EF 35%, coronary artery disease by CT scan, hypertension, hyperlipidemia who presented to the ED on 03/26/2023 for bradycardia.  Found to be in complete heart block upon arrival to the ED.  Cardiology was consulted for further evaluation.  Interval History:  -Patient seen and examined this morning.  Feeling well overall without any complaints. -Denies any chest pain, shortness of breath, lightheadedness/dizziness. -Pacemaker appears to be functioning appropriately, BP remains elevated. -Patient is eager to go home, has been OOB around her room and tolerated this well.   Review of systems complete and found to be negative unless listed above    Vitals:   03/28/23 0400 03/28/23 0500 03/28/23 0600 03/28/23 0800  BP: (!) 149/74 (!) 151/73  (!) 160/84  Pulse: 77 63 71 73  Resp: (!) 25 19 17 18   Temp: 97.9 F (36.6 C)   98 F (36.7 C)  TempSrc: Oral   Oral  SpO2: 100% 99% 100% 100%  Weight: 90 kg     Height:        No intake or output data in the 24 hours ending 03/28/23 0851    PHYSICAL EXAM General: Well-appearing female, well nourished, in no acute distress. HEENT: Normocephalic and atraumatic. Neck: No JVD.  Lungs: Normal respiratory effort on room air. Clear bilaterally to auscultation. No wheezes, crackles, rhonchi.  Heart: HRRR. Normal S1 and S2 without gallops or murmurs. Radial & DP pulses 2+ bilaterally. Abdomen: Non-distended appearing.  Msk: Normal strength and tone for age. Extremities: No clubbing, cyanosis or edema.  R groin access site  without significant tenderness, ecchymosis, no active bleeding.  Neuro: Alert and oriented X 3. Psych: Mood appropriate, affect congruent.    LABS: Basic Metabolic Panel: Recent Labs    03/26/23 1246 03/26/23 1822 03/27/23 0333 03/28/23 0429  NA 142  --  144 139  K 3.5  --  3.9 3.6  CL 110  --  107 105  CO2 22  --  24 24  GLUCOSE 157*  --  106* 144*  BUN 12  --  12 13  CREATININE 0.96  --  0.93 1.01*  CALCIUM 8.0*  --  9.5 9.4  MG 1.0* 1.6* 2.2  --   PHOS 2.5  --   --   --    Liver Function Tests: Recent Labs    03/26/23 1246  AST 19  ALT 14  ALKPHOS 58  BILITOT 0.6  PROT 6.1*  ALBUMIN 3.6   No results for input(s): "LIPASE", "AMYLASE" in the last 72 hours. CBC: Recent Labs    03/26/23 1246 03/27/23 0333 03/28/23 0429  WBC 6.8 7.8 7.6  NEUTROABS 4.8  --   --   HGB 11.2* 11.1* 11.1*  HCT 35.8* 35.0* 34.6*  MCV 91.3 90.0 88.0  PLT 195 207 184   Cardiac Enzymes: Recent Labs    03/26/23 1246 03/26/23 1412  TROPONINIHS 16 16   BNP: No results for input(s): "BNP" in the last 72 hours. D-Dimer: No results for input(s): "DDIMER" in the last 72 hours. Hemoglobin A1C: No results for input(s): "HGBA1C" in the last 72 hours. Fasting Lipid Panel:  No results for input(s): "CHOL", "HDL", "LDLCALC", "TRIG", "CHOLHDL", "LDLDIRECT" in the last 72 hours. Thyroid Function Tests: No results for input(s): "TSH", "T4TOTAL", "T3FREE", "THYROIDAB" in the last 72 hours.  Invalid input(s): "FREET3" Anemia Panel: No results for input(s): "VITAMINB12", "FOLATE", "FERRITIN", "TIBC", "IRON", "RETICCTPCT" in the last 72 hours.  EP PPM/ICD IMPLANT Result Date: 03/27/2023 Successful Micra AV leadless pacemaker implantation   CARDIAC CATHETERIZATION Result Date: 03/27/2023 Successful Micra AV leadless pacemaker implantation    ECHO pending  TELEMETRY reviewed by me 03/28/23: Ventricular pacing rate 80s  EKG reviewed by me 03/28/23: Third-degree heart block rate 38  bpm  DATA reviewed by me 03/28/23: last 24h vitals tele labs imaging I/O, hospitalist progress note  Principal Problem:   Symptomatic sinus bradycardia Active Problems:   Hypertension   Hypercholesteremia   Nicotine dependence   Coronary artery disease involving native coronary artery of native heart   GERD (gastroesophageal reflux disease)   Malignant neoplasm of upper-inner quadrant of left breast in female, estrogen receptor positive (HCC)   Hypocalcemia   Complete heart block (HCC)    ASSESSMENT AND PLAN: Brenda Combs is a 78 y.o. female with a past medical history of nonischemic cardiomyopathy with global LV dysfunction EF 35%, coronary artery disease by CT scan, hypertension, hyperlipidemia who presented to the ED on 03/26/2023 for bradycardia.  Found to be in complete heart block upon arrival to the ED.  Cardiology was consulted for further evaluation.  # Complete heart block # Chronic LBBB # Non-ischemic cardiomyopathy Patient with history of nonischemic cardiomyopathy, chronic left bundle branch block, nonobstructive CAD presented with bradycardia.  Had not had any dizziness or syncope.  Heart rate was in the 30s and she was in complete heart block.  S/p Micra leadless pacemaker implantation this morning 03/27/2023. -Echo pending review. -Resume home labetolol 200 mg twice daily.  Continue home Entresto 49-51 mg twice daily, spironolactone 25 mg daily. -Continue aspirin 81 mg daily.  Resume home atorvastatin 40 mg daily.  Ok for discharge today from a cardiac perspective. Will arrange for follow up in clinic with Minda Ditto, PA in 1-2 weeks.    This patient's case was discussed and created with Dr. Darrold Junker and he is in agreement.  Signed:  Gale Journey, PA-C  03/28/2023, 8:51 AM [facility]

## 2023-03-28 NOTE — TOC CM/SW Note (Signed)
 Transition of Care [facility]) - Inpatient Brief Assessment   Patient Details  Name: Brenda Combs MRN: [id number] Date of Birth: 05-Oct-1945  Transition of Care [facility]) CM/SW Contact:    Margarito Liner, LCSW Phone Number: 03/28/2023, 9:34 AM   Clinical Narrative: Patient has orders to discharge home today. Chart reviewed. No TOC needs identified. CSW signing off.  Transition of Care Asessment: Insurance and Status: Insurance coverage has been reviewed Patient has primary care physician: Yes Home environment has been reviewed: Single family home Prior level of function:: Not documented Prior/Current Home Services: No current home services Social Drivers of Health Review: SDOH reviewed no interventions necessary Readmission risk has been reviewed: Yes Transition of care needs: no transition of care needs at this time

## 2023-03-28 NOTE — Discharge Summary (Signed)
 Physician Discharge Summary   [name] MRN: [id number] DOB: [birth year]  Admit date:     03/26/2023  Discharge date: {dischdate:26783}  Discharge Physician: Marcelino Duster   PCP: Patrice Paradise, MD   Recommendations at discharge:  {Tip this will not be part of the note when signed- Example include specific recommendations for outpatient follow-up, pending tests to follow-up on. (Optional):26781}  ***  Discharge Diagnoses: Principal Problem:   Symptomatic sinus bradycardia Active Problems:   Hypertension   Hypercholesteremia   Nicotine dependence   Coronary artery disease involving native coronary artery of native heart   GERD (gastroesophageal reflux disease)   Malignant neoplasm of upper-inner quadrant of left breast in female, estrogen receptor positive (HCC)   Hypocalcemia   Complete heart block (HCC)  Resolved Problems:   * No resolved hospital problems. *  Hospital Course: Brenda Combs is a 78 year old female with history of hypertension, heart failure, hyperlipidemia, GERD, who presents to the emergency department for chief concerns of low blood pressure and low heart rate  Vitals in the ED showed temperature of 97.9, respiration rate of 17, heart rate of 38, blood pressure 175/58, currently at bedside in the 150s over 60s, SpO2 of 100% on room air.  Serum sodium is 142, potassium 2.5, chloride 110, bicarb 22, BUN of 12, serum creatinine of 0.96, EGFR greater than 60, nonfasting blood glucose 157, WBC 6.8, hemoglobin 11.2, platelets of 195.  High sensitive troponin was 16.  Serum calcium level was 8.0.  ED treatment: Magnesium 2 g IV one-time dose, atropine 0.5 mg IV one-time dose.  EDP consulted cardiology who states that they will see the patient.  Assessment and Plan: * Symptomatic sinus bradycardia Suspect complete heart block Status post atropine 0.5 mg IV one-time dose per EDP Magnesium level was found to be decreased at 1.0,  status post magnesium 2 g IV one-time dose per EDP Complete echo ordered on admission EDP has consulted cardiology, [facility] who states they will see the patient Epic order placed for cardiology, to [facility] clinic Telemetry N.p.o. after midnight in anticipation of permanent pacemaker placement  Hypocalcemia Mild; corrected sCa is 8.3 Calcium carbonate once ordered  Malignant neoplasm of upper-inner quadrant of left breast in female, estrogen receptor positive (HCC) Patient follows by oncology clinic, has been 2 years since radiation and surgery Patient was not on hormone therapy due to intolerance Patient was never on chemotherapy  Nicotine dependence Patient seen she does not smoke every day and sometimes she smokes 2-3 times per day She had 1 cigarette prior to coming to the ED today Patient states she is ready to quit now  Hypertension Hydralazine 5 mg IV every 4 hours as needed for SBP greater than 175, 5 days ordered      {Tip this will not be part of the note when signed Body mass index is 38.77 kg/m. , ,  (Optional):26781}  {(NOTE) Pain control PDMP Statment (Optional):26782} Consultants: *** Procedures performed: ***  Disposition: {Plan; Disposition:26390} Diet recommendation:  Discharge Diet Orders (From admission, onward)     Start     Ordered   03/28/23 0000  Diet - low sodium heart healthy        03/28/23 0922           {Diet_Plan:26776} DISCHARGE MEDICATION: Allergies as of 03/28/2023       Reactions   Jardiance [empagliflozin]    Abdominal pain   Augmentin [amoxicillin-pot Clavulanate] Diarrhea   Zyrtec [cetirizine Hcl]  depression        Medication List     STOP taking these medications    diazepam 5 MG tablet Commonly known as: VALIUM   meloxicam 7.5 MG tablet Commonly known as: MOBIC   naproxen 500 MG tablet Commonly known as: NAPROSYN   OYSCO 500 + D 500-5 MG-MCG Tabs Generic drug: Calcium  Carb-Cholecalciferol       TAKE these medications    allopurinol 100 MG tablet Commonly known as: ZYLOPRIM Take 100 mg by mouth daily.   aspirin 81 MG tablet Take 81 mg by mouth daily.   atorvastatin 40 MG tablet Commonly known as: LIPITOR Take 40 mg by mouth daily.   cyclobenzaprine 5 MG tablet Commonly known as: FLEXERIL Take 5 mg by mouth 2 (two) times daily as needed for muscle spasms.   Entresto 49-51 MG Generic drug: sacubitril-valsartan Take 1 tablet by mouth 2 (two) times daily.   fluticasone 50 MCG/ACT nasal spray Commonly known as: FLONASE Place 1 spray into both nostrils daily.   labetalol 200 MG tablet Commonly known as: NORMODYNE Take 200 mg by mouth 2 (two) times daily.   magnesium oxide 400 (240 Mg) MG tablet Commonly known as: MAG-OX Take 400 mg by mouth daily.   omeprazole 40 MG capsule Commonly known as: PRILOSEC Take 40 mg by mouth 2 (two) times daily.   spironolactone 25 MG tablet Commonly known as: ALDACTONE Take 25 mg by mouth daily.   Vitamin B-12 500 MCG Subl Place 1 tablet under the tongue daily at 6 (six) AM.        Follow-up Information     Gerlene Fee, PA-C. Go in 1 week(s).   Specialty: Cardiology Contact information: [address] [place] [address] [phone]                Discharge Exam: Ceasar Mons Weights   03/26/23 1240 03/26/23 1613 03/28/23 0400  Weight: 89.4 kg 91.5 kg 90 kg   ***  Condition at discharge: {DC Condition:26389}  The results of significant diagnostics from this hospitalization (including imaging, microbiology, ancillary and laboratory) are listed below for reference.   Imaging Studies: EP PPM/ICD IMPLANT Result Date: 03/27/2023 Successful Micra AV leadless pacemaker implantation   CARDIAC CATHETERIZATION Result Date: 03/27/2023 Successful Micra AV leadless pacemaker implantation   CT ABDOMEN PELVIS W CONTRAST Result Date: 03/19/2023 CLINICAL DATA:  Left upper  quadrant abdominal pain, muscle cramping, intermittent for several months. EXAM: CT ABDOMEN AND PELVIS WITH CONTRAST TECHNIQUE: Multidetector CT imaging of the abdomen and pelvis was performed using the standard protocol following bolus administration of intravenous contrast. RADIATION DOSE REDUCTION: This exam was performed according to the departmental dose-optimization program which includes automated exposure control, adjustment of the mA and/or kV according to patient size and/or use of iterative reconstruction technique. CONTRAST:  OMNIPAQUE IOHEXOL 300 MG/ML  SOLN COMPARISON:  None Available. FINDINGS: Lower chest: No acute abnormality. Hepatobiliary: No suspicious hepatic lesion. Pharyngeal cap on the gallbladder. No biliary ductal dilation. Pancreas: No pancreatic ductal dilation or evidence of acute inflammation. Spleen: No splenomegaly. Adrenals/Urinary Tract: Bilateral adrenal glands appear normal. No hydronephrosis. Kidneys demonstrate symmetric enhancement. Urinary bladder is unremarkable for degree of distension. Stomach/Bowel: No radiopaque enteric contrast material was administered. Stomach is unremarkable for degree of distension. No pathologic dilation of small or large bowel. Surgical clips along the cecum likely reflect appendectomy. Colonic diverticulosis without findings of acute diverticulitis. Vascular/Lymphatic: Aortic atherosclerosis. Normal caliber abdominal aorta. Smooth IVC contours. The portal, splenic  and superior mesenteric veins are patent. No pathologically enlarged abdominal or pelvic lymph nodes. Reproductive: Uterus and bilateral adnexa are unremarkable. Other: No significant abdominopelvic free fluid. No pneumoperitoneum. No walled off fluid collection. Musculoskeletal: Multilevel degenerative change of the spine with grade 1 L4 on L5 degenerative anterolisthesis. Asymmetric sclerosis of the right SI joint. IMPRESSION: 1. No acute abnormality in the abdomen or pelvis. 2.  Colonic diverticulosis without findings of acute diverticulitis. 3. Asymmetric sclerosis of the right SI joint, which can be seen in the setting of sacroiliitis. 4.  Aortic Atherosclerosis (ICD10-I70.0). Electronically Signed   By: Maudry Mayhew M.D.   On: 03/19/2023 11:44    Microbiology: Results for orders placed or performed during the hospital encounter of 03/26/23  MRSA Next Gen by PCR, Nasal     Status: None   Collection Time: 03/26/23  4:15 PM   Specimen: Nasal Mucosa; Nasal Swab  Result Value Ref Range Status   MRSA by PCR Next Gen NOT DETECTED NOT DETECTED Final    Comment: (NOTE) The GeneXpert MRSA Assay (FDA approved for NASAL specimens only), is one component of a comprehensive MRSA colonization surveillance program. It is not intended to diagnose MRSA infection nor to guide or monitor treatment for MRSA infections. Test performance is not FDA approved in patients less than 50 years old. Performed at [facility], [address] [place], [place] [address]     Labs: CBC: Recent Labs  Lab 03/26/23 1246 03/27/23 0333 03/28/23 0429  WBC 6.8 7.8 7.6  NEUTROABS 4.8  --   --   HGB 11.2* 11.1* 11.1*  HCT 35.8* 35.0* 34.6*  MCV 91.3 90.0 88.0  PLT 195 207 184   Basic Metabolic Panel: Recent Labs  Lab 03/26/23 1246 03/26/23 1822 03/27/23 0333 03/28/23 0429  NA 142  --  144 139  K 3.5  --  3.9 3.6  CL 110  --  107 105  CO2 22  --  24 24  GLUCOSE 157*  --  106* 144*  BUN 12  --  12 13  CREATININE 0.96  --  0.93 1.01*  CALCIUM 8.0*  --  9.5 9.4  MG 1.0* 1.6* 2.2  --   PHOS 2.5  --   --   --    Liver Function Tests: Recent Labs  Lab 03/26/23 1246  AST 19  ALT 14  ALKPHOS 58  BILITOT 0.6  PROT 6.1*  ALBUMIN 3.6   CBG: Recent Labs  Lab 03/26/23 1611  GLUCAP 113*    Discharge time spent: {LESS THAN/GREATER THAN:26388} 30 minutes.  Signed: Marcelino Duster, MD [facility] 03/28/2023

## 2023-04-05 ENCOUNTER — Encounter: Payer: Self-pay | Admitting: Cardiology

## 2023-04-05 LAB — ECHOCARDIOGRAM COMPLETE
Height: 60 in
S' Lateral: 3 cm
Weight: 3227.53 [oz_av]

## 2023-04-11 ENCOUNTER — Ambulatory Visit
Admission: RE | Admit: 2023-04-11 | Discharge: 2023-04-11 | Disposition: A | Discharge status: Home / Self Care | Payer: Medicare PPO | Source: Ambulatory Visit | Attending: Physician Assistant | Admitting: Physician Assistant

## 2023-04-11 ENCOUNTER — Encounter: Payer: Self-pay | Admitting: Cardiology

## 2023-04-11 DIAGNOSIS — Z87891 Personal history of nicotine dependence: Secondary | ICD-10-CM | POA: Insufficient documentation

## 2023-04-11 DIAGNOSIS — Z122 Encounter for screening for malignant neoplasm of respiratory organs: Secondary | ICD-10-CM | POA: Diagnosis present

## 2023-04-11 DIAGNOSIS — F17210 Nicotine dependence, cigarettes, uncomplicated: Secondary | ICD-10-CM | POA: Insufficient documentation

## 2023-04-13 ENCOUNTER — Encounter: Payer: Self-pay | Admitting: Cardiology

## 2023-04-14 ENCOUNTER — Encounter: Payer: Self-pay | Admitting: Cardiology

## 2023-04-17 ENCOUNTER — Ambulatory Visit: Payer: Medicare PPO

## 2023-05-08 ENCOUNTER — Telehealth: Payer: Self-pay

## 2023-05-08 DIAGNOSIS — R911 Solitary pulmonary nodule: Secondary | ICD-10-CM

## 2023-05-08 NOTE — Telephone Encounter (Signed)
 Call Report from Tiffany:  IMPRESSION: 1. New part solid apical left upper lobe nodule. Lung-RADS 3, probably benign findings. Short-term follow-up in 6 months is recommended with repeat low-dose chest CT without contrast (please use the following order, "CT CHEST LCS NODULE FOLLOW-UP W/O CM"). These results will be called to the ordering clinician or representative by the Radiologist Assistant, and communication documented in the PACS or [employer]. 2. Enlarged nodular thyroid. Previous thyroid ultrasound 05/24/2013 and biopsy 06/05/2013. Please refer to those reports and consider repeat thyroid ultrasound, as clinically indicated. 3. Probable postoperative seroma in the medial left breast. Please correlate clinically. 4. Chronic gastric wall thickening. 5. Aortic atherosclerosis (ICD10-I70.0). Coronary artery calcification. 6. Enlarged pulmonic trunk, indicative of pulmonary arterial hypertension. 7.  Emphysema (ICD10-J43.9).

## 2023-05-15 NOTE — Telephone Encounter (Signed)
 Called and spoke to patient. Informed her of the results of LDCT. We discussed each finding. Patient verbalized understanding and is agreeable to 6 month follow up scan, order placed. Patient has PCP follow up next week and will discuss the thyroid findings. Results sent to PCP. Patient also aware the results will be sent to Oncology and Cardiology.

## 2023-05-15 NOTE — Addendum Note (Signed)
 Addended by: Veryl Gottron on: 05/15/2023 11:11 AM   Modules accepted: Orders

## 2023-05-15 NOTE — Telephone Encounter (Signed)
 Patient had LDCT on 04/11/2023 that resulted as an LR3. Dara Ear NP has reviewed patient's chart and results and has requested we reach out to PCP to see if they feel patient needs another thyroid ultrasound if they feel it is clinical indicated. Patient is being follow by cardiology and had a recent echo that resulted normal. Patient has a history of a left breast lumpectomy and could be cause of breast seroma. Patient will need 6 month follow up scan, due 10/13/2023. Will need to send results to PCP regarding thyroid, cardiology regarding PAH and oncology regarding breast seroma.

## 2023-06-14 ENCOUNTER — Other Ambulatory Visit: Payer: Medicare PPO

## 2023-06-14 ENCOUNTER — Ambulatory Visit: Payer: Medicare PPO | Admitting: Oncology

## 2023-07-03 ENCOUNTER — Inpatient Hospital Stay: Admitting: Oncology

## 2023-07-03 ENCOUNTER — Inpatient Hospital Stay

## 2023-07-03 ENCOUNTER — Inpatient Hospital Stay: Attending: Oncology

## 2023-07-03 ENCOUNTER — Encounter: Payer: Self-pay | Admitting: Oncology

## 2023-07-03 VITALS — BP 144/70 | HR 64 | Temp 98.6°F | Resp 20 | Wt 201.0 lb

## 2023-07-03 DIAGNOSIS — Z7982 Long term (current) use of aspirin: Secondary | ICD-10-CM | POA: Diagnosis not present

## 2023-07-03 DIAGNOSIS — Z881 Allergy status to other antibiotic agents status: Secondary | ICD-10-CM | POA: Insufficient documentation

## 2023-07-03 DIAGNOSIS — I700 Atherosclerosis of aorta: Secondary | ICD-10-CM | POA: Diagnosis not present

## 2023-07-03 DIAGNOSIS — Z8249 Family history of ischemic heart disease and other diseases of the circulatory system: Secondary | ICD-10-CM | POA: Insufficient documentation

## 2023-07-03 DIAGNOSIS — Z79899 Other long term (current) drug therapy: Secondary | ICD-10-CM | POA: Insufficient documentation

## 2023-07-03 DIAGNOSIS — Z9049 Acquired absence of other specified parts of digestive tract: Secondary | ICD-10-CM | POA: Insufficient documentation

## 2023-07-03 DIAGNOSIS — M8580 Other specified disorders of bone density and structure, unspecified site: Secondary | ICD-10-CM | POA: Diagnosis not present

## 2023-07-03 DIAGNOSIS — Z833 Family history of diabetes mellitus: Secondary | ICD-10-CM | POA: Insufficient documentation

## 2023-07-03 DIAGNOSIS — Z83438 Family history of other disorder of lipoprotein metabolism and other lipidemia: Secondary | ICD-10-CM | POA: Insufficient documentation

## 2023-07-03 DIAGNOSIS — Z1732 Human epidermal growth factor receptor 2 negative status: Secondary | ICD-10-CM | POA: Diagnosis not present

## 2023-07-03 DIAGNOSIS — Z170 Estrogen receptor positive status [ER+]: Secondary | ICD-10-CM

## 2023-07-03 DIAGNOSIS — Z1721 Progesterone receptor positive status: Secondary | ICD-10-CM | POA: Insufficient documentation

## 2023-07-03 DIAGNOSIS — C50212 Malignant neoplasm of upper-inner quadrant of left female breast: Secondary | ICD-10-CM | POA: Insufficient documentation

## 2023-07-03 DIAGNOSIS — Z8261 Family history of arthritis: Secondary | ICD-10-CM | POA: Diagnosis not present

## 2023-07-03 DIAGNOSIS — Z87891 Personal history of nicotine dependence: Secondary | ICD-10-CM | POA: Insufficient documentation

## 2023-07-03 DIAGNOSIS — Z880 Allergy status to penicillin: Secondary | ICD-10-CM | POA: Insufficient documentation

## 2023-07-03 DIAGNOSIS — E042 Nontoxic multinodular goiter: Secondary | ICD-10-CM | POA: Diagnosis not present

## 2023-07-03 DIAGNOSIS — M109 Gout, unspecified: Secondary | ICD-10-CM | POA: Insufficient documentation

## 2023-07-03 DIAGNOSIS — J432 Centrilobular emphysema: Secondary | ICD-10-CM | POA: Insufficient documentation

## 2023-07-03 LAB — CBC WITH DIFFERENTIAL (CANCER CENTER ONLY)
Abs Immature Granulocytes: 0.02 10*3/uL (ref 0.00–0.07)
Basophils Absolute: 0 10*3/uL (ref 0.0–0.1)
Basophils Relative: 1 %
Eosinophils Absolute: 0.1 10*3/uL (ref 0.0–0.5)
Eosinophils Relative: 2 %
HCT: 35.8 % — ABNORMAL LOW (ref 36.0–46.0)
Hemoglobin: 11.1 g/dL — ABNORMAL LOW (ref 12.0–15.0)
Immature Granulocytes: 0 %
Lymphocytes Relative: 19 %
Lymphs Abs: 1.4 10*3/uL (ref 0.7–4.0)
MCH: 28 pg (ref 26.0–34.0)
MCHC: 31 g/dL (ref 30.0–36.0)
MCV: 90.2 fL (ref 80.0–100.0)
Monocytes Absolute: 0.6 10*3/uL (ref 0.1–1.0)
Monocytes Relative: 8 %
Neutro Abs: 5.1 10*3/uL (ref 1.7–7.7)
Neutrophils Relative %: 70 %
Platelet Count: 189 10*3/uL (ref 150–400)
RBC: 3.97 MIL/uL (ref 3.87–5.11)
RDW: 13.7 % (ref 11.5–15.5)
WBC Count: 7.3 10*3/uL (ref 4.0–10.5)
nRBC: 0 % (ref 0.0–0.2)

## 2023-07-03 LAB — CMP (CANCER CENTER ONLY)
ALT: 16 U/L (ref 0–44)
AST: 20 U/L (ref 15–41)
Albumin: 4 g/dL (ref 3.5–5.0)
Alkaline Phosphatase: 66 U/L (ref 38–126)
Anion gap: 10 (ref 5–15)
BUN: 12 mg/dL (ref 8–23)
CO2: 23 mmol/L (ref 22–32)
Calcium: 7.9 mg/dL — ABNORMAL LOW (ref 8.9–10.3)
Chloride: 107 mmol/L (ref 98–111)
Creatinine: 1.04 mg/dL — ABNORMAL HIGH (ref 0.44–1.00)
GFR, Estimated: 55 mL/min — ABNORMAL LOW (ref >60)
Glucose, Bld: 118 mg/dL — ABNORMAL HIGH (ref 70–99)
Potassium: 3.7 mmol/L (ref 3.5–5.1)
Sodium: 140 mmol/L (ref 135–145)
Total Bilirubin: 0.6 mg/dL (ref 0.0–1.2)
Total Protein: 6.9 g/dL (ref 6.5–8.1)

## 2023-07-03 MED ORDER — SODIUM CHLORIDE 0.9 % IV SOLN
2.0000 g | Freq: Once | INTRAVENOUS | Status: AC
  Administered 2023-07-03: 2 g via INTRAVENOUS
  Filled 2023-07-03: qty 20

## 2023-07-03 NOTE — Patient Instructions (Signed)
Calcium Gluconate Injection What is this medication? CALCIUM GLUCONATE (KAL see um GLOO koe nate) increases calcium levels in your body. Calcium is a mineral that plays an important role in building strong bones and maintaining heart health. This medicine may be used for other purposes; ask your health care provider or pharmacist if you have questions. What should I tell my care team before I take this medication? They need to know if you have any of these conditions: High levels of calcium in the blood History of irregular heartbeat History of kidney stones Kidney disease Parathyroid disease An unusual or allergic reaction to calcium, other medications, foods, dyes, or preservatives Pregnant or trying to get pregnant Breast-feeding How should I use this medication? This medication is injected into a vein. It is given by your care team in a hospital or clinic setting. Talk to your care team about the use of this medication in children. While it may be given to children for selected conditions, precautions do apply. Overdosage: If you think you have taken too much of this medicine contact a poison control center or emergency room at once. NOTE: This medicine is only for you. Do not share this medicine with others. What if I miss a dose? This does not apply. What may interact with this medication? Ceftriaxone Certain diuretics Digoxin Other calcium products This list may not describe all possible interactions. Give your health care provider a list of all the medicines, herbs, non-prescription drugs, or dietary supplements you use. Also tell them if you smoke, drink alcohol, or use illegal drugs. Some items may interact with your medicine. What should I watch for while using this medication? Your condition will be monitored carefully while you are receiving this medication. You may need blood work while you are taking this medication. What side effects may I notice from receiving this  medication? Side effects that you should report to your care team as soon as possible: Allergic reactions--skin rash, itching, hives, swelling of the face, lips, tongue, or throat Fast or irregular heartbeat High calcium level--increased thirst or amount of urine, nausea, vomiting, confusion, unusual weakness or fatigue, bone pain Low blood pressure--dizziness, feeling faint or lightheaded, blurry vision Pain, redness, or irritation at injection site Side effects that usually do not require medical attention (report to your care team if they continue or are bothersome): Change in taste Flushing, mostly over the face, neck, and chest, during injection This list may not describe all possible side effects. Call your doctor for medical advice about side effects. You may report side effects to FDA at 1-800-FDA-1088. Where should I keep my medication? This medication is given in a hospital or clinic. It will not be stored at home. NOTE: This sheet is a summary. It may not cover all possible information. If you have questions about this medicine, talk to your doctor, pharmacist, or health care provider.  2024 Elsevier/Gold Standard (2021-08-09 00:00:00)

## 2023-07-03 NOTE — Progress Notes (Signed)
 Hematology/Oncology Progress note [phone] [phone]            Patient Care Team: Delmus Ferri, MD as PCP - General (Physician Assistant) Timmy Forbes, MD as Consulting Physician (Hematology and Oncology) Glenis Langdon, MD as Consulting Physician (Radiation Oncology) Eldred Grego, MD as Consulting Physician (General Surgery)  ASSESSMENT & PLAN:   Cancer Staging  Malignant neoplasm of upper-inner quadrant of left breast in female, estrogen receptor positive (HCC) Staging form: Breast, AJCC 8th Edition - Pathologic: Stage Unknown (pT47mi, pNX, cM0, G1, ER+, PR+, HER2-) - Signed by Timmy Forbes, MD on 06/10/2021   Malignant neoplasm of upper-inner quadrant of left breast in female, 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 Additional sentinel lymph node biopsy was omitted due to patient's age. Not able to tolerate Arimidex  and Aromasin .  She is not interested in switching to other AI's or switching to tamoxifen.   Annual bilateral diagnostic mammogram in Nov 2025-  Hypocalcemia Recommend patient to increase  calcium  supplementation, 1800mg  daily divided in several doses.  IV calcium  gluconate 2g x 1.  Check PTH  Osteopenia Recommend calcium  and vitamin D supplementation.   Orders Placed This Encounter  Procedures   MM DIAG BREAST TOMO BILATERAL    Standing Status:   Future    Expected Date:   01/02/2024    Expiration Date:   07/02/2024    Reason for Exam (SYMPTOM  OR DIAGNOSIS REQUIRED):   history of breast cancer    Preferred imaging location?:   [facility]   Parathyroid hormone,  intact and calcium     Standing Status:   Future    Number of Occurrences:   1    Expiration Date:   07/02/2024   Follow up  6 months  lab MD cbc cmp  All questions were answered. The patient knows to call the clinic  with any problems, questions or concerns.  Timmy Forbes, MD, PhD [facility] Hematology Oncology 07/03/2023   CHIEF COMPLAINTS/REASON FOR VISIT:  left breast cancer  HISTORY OF PRESENTING ILLNESS:   Brenda Combs is a  78 y.o.  female presents for follow up of left breast cancer Oncology History  Malignant neoplasm of upper-inner quadrant of left breast in female, estrogen receptor positive (HCC)  12/18/2020 Mammogram   12/18/2020, screening mammogram bilaterally showed possible mass warrants further evaluation.  Right breast has no findings suspicious for malignancy. 12/28/2020, left breast diagnostic mammogram showed 11:00 1.4 cm suspicious mass.  Associated calcifications in linear distribution spanning 4 cm.  There is no evidence of left axillary lymphadenopathy.   01/07/2021 Initial Diagnosis   Malignant neoplasm of upper-inner quadrant of left breast in female, estrogen receptor positive   -Ultrasound-guided left breast 11:00 mass biopsy   Pathology showed detached fragment of papillary lesion with sclerosis and atypia. and stereotactic biopsy of left upper inner quadrant calcification.  The pathology showed papillary lesion with atypia.  Calcifications are present.  No definitive evidence of invasive carcinoma in these biopsies.  -03/01/2021, patient underwent left breast lumpectomy by Dr. Charmel Cooter. 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 Margins are negative for both invasive carcinoma and DCIS.  pT1mi pNx ER+ [ER expression not visualizing the invasive component due to small size and suboptimal tissue sections.]-PR + HER2 negative     03/09/2021 Cancer Staging   Staging form:  Breast, AJCC 8th Edition - Pathologic: Stage Unknown (pT72mi, pNX, cM0, G1, ER+, PR+, HER2-) - Signed by Timmy Forbes, MD on 06/10/2021 Stage prefix: Initial  diagnosis Multigene prognostic tests performed: None Histologic grading system: 3 grade system   04/13/2021 - 05/20/2021 Radiation Therapy   adjuvant radiation to the left breast.   07/14/2021 Imaging   DEXA -This patient is considered osteopenic  FRAX* RESULTS  10-year Probability of Fracture1 Major Osteoporotic Fracture2 Hip Fracture 5.7% 1.5%   07/14/2021 - 07/28/2021 Anti-estrogen oral therapy   Arimidex  1mg  daily.self stopped due to arthralgia   09/10/2021 -  Anti-estrogen oral therapy   Aromasin  25mg  daily.  Patient tried and stopped shortly after starting.     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  INTERVAL HISTORY Brenda Combs is a 78 y.o. female who has above history reviewed by me today presents for follow up visit for management of left breast DCIS with microinvasive carcinoma. She can not tolerate Aromasin  and has stopped.  Chronic arthralgia, no new breast concerns +leg cramps During interval, patient has had pacemaker placed    Review of Systems  Constitutional:  Negative for appetite change, chills, fatigue and fever.  HENT:   Negative for hearing loss and voice change.   Eyes:  Negative for eye problems.  Respiratory:  Negative for chest tightness and cough.   Cardiovascular:  Negative for chest pain.  Gastrointestinal:  Negative for abdominal distention, abdominal pain and blood in stool.  Endocrine: Negative for hot flashes.  Genitourinary:  Negative for difficulty urinating and frequency.   Musculoskeletal:  Positive for arthralgias.  Skin:  Negative for itching and rash.  Neurological:  Negative for extremity weakness.  Hematological:  Negative for adenopathy.  Psychiatric/Behavioral:  Negative for confusion.     MEDICAL HISTORY:  Past Medical History:  Diagnosis Date   Arthritis    Bilateral  carotid artery stenosis    CAD (coronary artery disease)    GERD (gastroesophageal reflux disease)    Gout    Heart murmur    History of abnormal Pap smear    Hyperlipidemia    Hypertension    LBBB (left bundle branch block) 01/20/2021   a.) noted on preoperative ECG   NICM (nonischemic cardiomyopathy) (HCC) 02/15/2021   a.) TTE 02/15/2021: ED 35-40%; mod LV systolic dysfunction with LVH; mild panvalvular regurgitation. b.) Lexiscan 02/15/2021: EF 21%; global LV systolic dysfunction; no significant ischemia.   SOB (shortness of breath) on exertion     SURGICAL HISTORY: Past Surgical History:  Procedure Laterality Date   APPENDECTOMY     BREAST BIOPSY Left 01/07/2021   stereo bx calcs, coil marker, PAPILLARY LESION WITH ATYPIA- CALCIFICATIONS ARE PRESENT. 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, Microinvasive carcinoma   BREAST BIOPSY Left 03/01/2021   Procedure: Excisional BREAST BIOPSY WITH NEEDLE LOCALIZATION (bracket);  Surgeon: Eldred Grego, MD;  Location: [facility] ORS;  Service: General;  Laterality: Left;   BREAST EXCISIONAL BIOPSY Left 2022   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, Microinvasive carcinoma   COLONOSCOPY WITH PROPOFOL  N/A 11/23/2015   Procedure: COLONOSCOPY WITH PROPOFOL ;  Surgeon: Cassie Click, MD;  Location: [facility] ENDOSCOPY;  Service: Endoscopy;  Laterality: N/A;   COLONOSCOPY WITH PROPOFOL  N/A 02/07/2017   Procedure:  COLONOSCOPY WITH PROPOFOL ;  Surgeon: Deveron Fly, MD;  Location: [facility] ENDOSCOPY;  Service: Endoscopy;  Laterality: N/A;   ESOPHAGOGASTRODUODENOSCOPY (EGD) WITH PROPOFOL  N/A 11/23/2015   Procedure: ESOPHAGOGASTRODUODENOSCOPY (EGD) WITH PROPOFOL ;  Surgeon: Cassie Click, MD;  Location[facility] END[facility]COPY;  Service: Endoscopy;  Laterality: N/A;   PACEMAKER IMPLANT N/A 03/27/2023    Procedure: PACEMAKER IMPLANT;  Surgeon: Percival Brace, MD;  Location: [facility] INVASIVE CV LAB;  Service: Cardiovascular;  Laterality: N/A;  DDD pacer   TEMPORARY PACEMAKER N/A 03/27/2023   Procedure: TEMPORARY PACEMAKER;  Surgeon: Percival Brace, MD;  Location: [facility] INVASIVE CV LAB;  Service: Cardiovascular;  Laterality: N/A;   TUBAL LIGATION     WISDOM TOOTH EXTRACTION     x 2    SOCIAL HISTORY: Social History   Socioeconomic History   Marital status: Widowed    Spouse name: Not on file   Number of children: Not on file   Years of education: Not on file   Highest education level: Not on file  Occupational History   Not on file  Tobacco Use   Smoking status: Former    Current packs/day: 0.00    Average packs/day: 0.8 packs/day for 40.0 years (30.0 ttl pk-yrs)    Types: Cigarettes    Start date: 02/28/1981    Quit date: 02/28/2021    Years since quitting: 2.3   Smokeless tobacco: Never  Vaping Use   Vaping status: Never Used  Substance and Sexual Activity   Alcohol use: No   Drug use: No   Sexual activity: Not Currently    Birth control/protection: Post-menopausal    Comment: widow since 2000  Other Topics Concern   Not on file  Social History Narrative   Lives with daughter   Social Drivers of Health   Financial Resource Strain: Low Risk  (11/21/2022)   Received [facility]   Overall Financial Resource Strain (CARDIA)    Difficulty of Paying Living Expenses: Not hard at all  Food Insecurity: No Food Insecurity (03/26/2023)   Hunger Vital Sign    Worried About Running Out of Food in the Last Year: Never true    Ran Out of Food in the Last Year: Never true  Transportation Needs: No Transportation Needs (03/26/2023)   PRAPARE - Transportation    Lack of Transportation (Medical): No    Lack of Transportation (Non-Medical): No  Physical Activity: Not on file  Stress: Not on file  Social Connections: Moderately Integrated (03/27/2023)    Social Connection and Isolation Panel [NHANES]    Frequency of Communication with Friends and Family: Three times a week    Frequency of Social Gatherings with Friends and Family: Three times a week    Attends Religious Services: More than 4 times per year    Active Member of Clubs or Organizations: Yes    Attends Banker Meetings: 1 to 4 times per year    Marital Status: Widowed  Intimate Partner Violence: Not At Risk (03/26/2023)   Humiliation, Afraid, Rape, and Kick questionnaire    Fear of Current or Ex-Partner: No    Emotionally Abused: No    Physically Abused: No    Sexually Abused: No    FAMILY HISTORY: Family History  Problem Relation Age of Onset   Arthritis Mother    Hyperlipidemia Mother    Hypertension Mother    Hyperlipidemia Sister    Hypertension Sister    Hypertension Daughter    Diabetes Sister  Hyperlipidemia Sister    Hypertension Sister    Hypertension Daughter    Hypertension Daughter    Breast cancer Neg Hx     ALLERGIES:  is allergic to jardiance [empagliflozin], augmentin [amoxicillin-pot clavulanate], and zyrtec [cetirizine hcl].  MEDICATIONS:  Current Outpatient Medications  Medication Sig Dispense Refill   allopurinol  (ZYLOPRIM ) 100 MG tablet Take 100 mg by mouth daily.     amLODipine (NORVASC) 5 MG tablet Take 7.5 mg by mouth.     aspirin  81 MG tablet Take 81 mg by mouth daily.     atorvastatin  (LIPITOR) 40 MG tablet Take 40 mg by mouth daily.     Cyanocobalamin (VITAMIN B-12) 500 MCG SUBL Place 1 tablet under the tongue daily at 6 (six) AM.     cyclobenzaprine (FLEXERIL) 5 MG tablet Take 5 mg by mouth 2 (two) times daily as needed for muscle spasms.     ENTRESTO  49-51 MG Take 1 tablet by mouth 2 (two) times daily.     fluticasone (FLONASE) 50 MCG/ACT nasal spray Place 1 spray into both nostrils daily.     labetalol  (NORMODYNE ) 200 MG tablet Take 200 mg by mouth 2 (two) times daily.     magnesium  oxide (MAG-OX) 400 (240 Mg) MG  tablet Take 400 mg by mouth daily.     omeprazole (PRILOSEC) 40 MG capsule Take 40 mg by mouth 2 (two) times daily.     spironolactone  (ALDACTONE ) 25 MG tablet Take 25 mg by mouth daily.     No current facility-administered medications for this visit.     PHYSICAL EXAMINATION: ECOG PERFORMANCE STATUS: 1 - Symptomatic but completely ambulatory Vitals:   07/03/23 1359  BP: (!) 144/70  Pulse: 64  Resp: 20  Temp: 98.6 F (37 C)  SpO2: 100%   Filed Weights   07/03/23 1359  Weight: 201 lb (91.2 kg)    Physical Exam Constitutional:      General: She is not in acute distress. HENT:     Head: Normocephalic and atraumatic.  Eyes:     General: No scleral icterus. Cardiovascular:     Rate and Rhythm: Normal rate and regular rhythm.     Heart sounds: Normal heart sounds.  Pulmonary:     Effort: Pulmonary effort is normal. No respiratory distress.     Breath sounds: No wheezing.  Abdominal:     General: Bowel sounds are normal. There is no distension.     Palpations: Abdomen is soft.  Musculoskeletal:        General: No deformity. Normal range of motion.     Cervical back: Normal range of motion and neck supple.  Skin:    General: Skin is warm and dry.     Findings: No erythema or rash.  Neurological:     Mental Status: She is alert and oriented to person, place, and time. Mental status is at baseline.     Cranial Nerves: No cranial nerve deficit.     Coordination: Coordination normal.  Psychiatric:        Mood and Affect: Mood normal.   Breast examination was performed. No palpable masses in bilateral breast.  No palpable axillary lymphadenopathy.  LABORATORY DATA:  I have reviewed the data as listed Lab Results  Component Value Date   WBC 7.3 07/03/2023   HGB 11.1 (L) 07/03/2023   HCT 35.8 (L) 07/03/2023   MCV 90.2 07/03/2023   PLT 189 07/03/2023   Recent Labs    12/15/22 0949 12/15/22 0949 03/26/23  1246 03/27/23 0333 03/28/23 0429 07/03/23 1314  NA 141   --  142 144 139 140  K 3.6  --  3.5 3.9 3.6 3.7  CL 102  --  110 107 105 107  CO2 27  --  22 24 24 23   GLUCOSE 110*  --  157* 106* 144* 118*  BUN 11  --  12 12 13 12   CREATININE 0.93   < > 0.96 0.93 1.01* 1.04*  CALCIUM  9.3  --  8.0* 9.5 9.4 7.9*  GFRNONAA >60   < > >60 >60 58* 55*  PROT 7.1  --  6.1*  --   --  6.9  ALBUMIN 4.2  --  3.6  --   --  4.0  AST 22  --  19  --   --  20  ALT 17  --  14  --   --  16  ALKPHOS 73  --  58  --   --  66  BILITOT 0.8  --  0.6  --   --  0.6   < > = values in this interval not displayed.   RADIOGRAPHIC STUDIES: I have personally reviewed the radiological images as listed and agreed with the findings in the report. CT CHEST LUNG CA SCREEN LOW DOSE W/O CM Result Date: 05/08/2023 CLINICAL DATA:  Current 35 pack-year smoker. EXAM: CT CHEST WITHOUT CONTRAST LOW-DOSE FOR LUNG CANCER SCREENING TECHNIQUE: Multidetector CT imaging of the chest was performed following the standard protocol without IV contrast. RADIATION DOSE REDUCTION: This exam was performed according to the departmental dose-optimization program which includes automated exposure control, adjustment of the mA and/or kV according to patient size and/or use of iterative reconstruction technique. COMPARISON:  11/05/2019. FINDINGS: Cardiovascular: Atherosclerotic calcification of the aorta, aortic valve and coronary arteries. Enlarged pulmonic trunk and heart. No pericardial effusion. Ascending aorta measures approximately 3.8 cm (coronal image 111). Mediastinum/Nodes: Enlarged and nodular thyroid  with index left thyroid  nodule measuring 3.0 cm. No pathologically enlarged mediastinal or axillary lymph nodes. Hilar regions are difficult to definitively evaluate without IV contrast. Fluid collection in the medial left breast measures 2.9 cm, new from 11/05/2019 and possibly a postoperative seroma. Esophagus is grossly unremarkable. Lungs/Pleura: Mild centrilobular and paraseptal emphysema. Smoking related  respiratory bronchiolitis. New 10.8 mm part solid apical left upper lobe nodule with a 5.8 mm solid component (3/33). Additional pulmonary nodules measure 4.1 mm or less in size. No pleural fluid. No pleural fluid. Airway is unremarkable. Upper Abdomen: Chronic gastric wall thickening. Visualized portions of the liver, gallbladder, adrenal glands, kidneys, spleen, pancreas, stomach and bowel are grossly unremarkable. No upper abdominal adenopathy. Musculoskeletal: Degenerative changes in the spine. IMPRESSION: 1. New part solid apical left upper lobe nodule. Lung-RADS 3, probably benign findings. Short-term follow-up in 6 months is recommended with repeat low-dose chest CT without contrast (please use the following order, "CT CHEST LCS NODULE FOLLOW-UP W/O CM"). These results will be called to the ordering clinician or representative by the Radiologist Assistant, and communication documented in the PACS or [employer]. 2. Enlarged nodular thyroid . Previous thyroid  ultrasound 05/24/2013 and biopsy 06/05/2013. Please refer to those reports and consider repeat thyroid  ultrasound, as clinically indicated. 3. Probable postoperative seroma in the medial left breast. Please correlate clinically. 4. Chronic gastric wall thickening. 5. Aortic atherosclerosis (ICD10-I70.0). Coronary artery calcification. 6. Enlarged pulmonic trunk, indicative of pulmonary arterial hypertension. 7.  Emphysema (ICD10-J43.9). Electronically Signed   By: Shearon Denis M.D.   On: 05/08/2023 10:16

## 2023-07-03 NOTE — Assessment & Plan Note (Signed)
 Recommend calcium and vitamin D supplementation.

## 2023-07-03 NOTE — Assessment & Plan Note (Addendum)
 Recommend patient to increase  calcium  supplementation, 1800mg  daily divided in several doses.  IV calcium  gluconate 2g x 1.  Check PTH

## 2023-07-03 NOTE — Assessment & Plan Note (Addendum)
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 Additional sentinel lymph node biopsy was omitted due to patient's age. Not able to tolerate Arimidex  and Aromasin .  She is not interested in switching to other AI's or switching to tamoxifen.   Annual bilateral diagnostic mammogram in Nov 2025-

## 2023-07-04 ENCOUNTER — Other Ambulatory Visit: Payer: Self-pay

## 2023-07-04 DIAGNOSIS — Z170 Estrogen receptor positive status [ER+]: Secondary | ICD-10-CM

## 2023-07-04 LAB — PTH, INTACT AND CALCIUM
Calcium, Total (PTH): 8.6 mg/dL — ABNORMAL LOW (ref 8.7–10.3)
PTH: 51 pg/mL (ref 15–65)

## 2023-07-17 ENCOUNTER — Ambulatory Visit: Payer: Medicare PPO | Admitting: Radiation Oncology

## 2023-07-19 ENCOUNTER — Ambulatory Visit
Admission: RE | Admit: 2023-07-19 | Discharge: 2023-07-19 | Disposition: A | Discharge status: Home / Self Care | Source: Ambulatory Visit | Attending: Radiation Oncology | Admitting: Radiation Oncology

## 2023-07-19 ENCOUNTER — Encounter: Payer: Self-pay | Admitting: Radiation Oncology

## 2023-07-19 VITALS — BP 127/86 | HR 94 | Temp 96.5°F | Resp 16 | Ht 60.0 in | Wt 198.2 lb

## 2023-07-19 DIAGNOSIS — C50212 Malignant neoplasm of upper-inner quadrant of left female breast: Secondary | ICD-10-CM

## 2023-07-19 DIAGNOSIS — Z853 Personal history of malignant neoplasm of breast: Secondary | ICD-10-CM | POA: Diagnosis present

## 2023-07-19 DIAGNOSIS — Z923 Personal history of irradiation: Secondary | ICD-10-CM | POA: Insufficient documentation

## 2023-07-19 NOTE — Progress Notes (Signed)
 Radiation Oncology Follow up Note  Name: Brenda Combs   Date:   07/19/2023 MRN:  [id number] DOB: [birth year]    This 78 y.o. female presents to the clinic today for 2-year follow-up status post whole breast radiation to her left breast for stage Ia ER/PR positive microinvasive carcinoma.  REFERRING PROVIDER: Delmus Ferri, PA  HPI: Patient is a 78 year old female now out over 2 years having completed whole breast radiation to her left breast for stage Ia ER/PR positive microinvasive carcinoma.  Seen today in follow-up she is doing well.  She has recently had a pacemaker placed in her left anterior chest.  Specifically denies breast tenderness cough or bone pain..  She had mammograms last November which I reviewed were BI-RADS 2 benign.  She also had recent lung screening showing a part solid apical left upper lobe nodule lung RADS 3 probably benign.  That is being followed clinically.  Patient is not on endocrine therapy.  COMPLICATIONS OF TREATMENT: none  FOLLOW UP COMPLIANCE: keeps appointments   PHYSICAL EXAM:  BP 127/86   Pulse 94   Temp (!) 96.5 F (35.8 C) (Tympanic)   Resp 16   Ht 5' (1.524 m)   Wt 198 lb 3.2 oz (89.9 kg)   BMI 38.71 kg/m  Lungs are clear to A&P cardiac examination essentially unremarkable with regular rate and rhythm. No dominant mass or nodularity is noted in either breast in 2 positions examined. Incision is well-healed. No axillary or supraclavicular adenopathy is appreciated. Cosmetic result is excellent.  Well-developed well-nourished patient in NAD. HEENT reveals PERLA, EOMI, discs not visualized.  Oral cavity is clear. No oral mucosal lesions are identified. Neck is clear without evidence of cervical or supraclavicular adenopathy. Lungs are clear to A&P. Cardiac examination is essentially unremarkable with regular rate and rhythm without murmur rub or thrill. Abdomen is benign with no organomegaly or masses noted. Motor sensory and DTR levels are  equal and symmetric in the upper and lower extremities. Cranial nerves II through XII are grossly intact. Proprioception is intact. No peripheral adenopathy or edema is identified. No motor or sensory levels are noted. Crude visual fields are within normal range.  RADIOLOGY RESULTS: CT scans and mammograms reviewed compatible with above-stated findings  PLAN: Present time patient is now out over 2 years with no evidence of recurrent disease.  I am going to turn her follow-up care over to her PMD as well as medical oncology.  I would be happy to reevaluate the patient anytime should that be indicated.  Patient knows to call with any concerns.  I would like to take this opportunity to thank you for allowing me to participate in the care of your patient.Glenis Langdon, MD

## 2023-08-31 ENCOUNTER — Encounter: Payer: Self-pay | Admitting: Nurse Practitioner

## 2023-08-31 ENCOUNTER — Other Ambulatory Visit: Payer: Self-pay | Admitting: Nurse Practitioner

## 2023-08-31 DIAGNOSIS — Z853 Personal history of malignant neoplasm of breast: Secondary | ICD-10-CM

## 2023-08-31 DIAGNOSIS — M7989 Other specified soft tissue disorders: Secondary | ICD-10-CM

## 2023-09-01 ENCOUNTER — Ambulatory Visit
Admission: RE | Admit: 2023-09-01 | Discharge: 2023-09-01 | Disposition: A | Discharge status: Home / Self Care | Source: Ambulatory Visit | Attending: Nurse Practitioner | Admitting: Nurse Practitioner

## 2023-09-01 DIAGNOSIS — M7989 Other specified soft tissue disorders: Secondary | ICD-10-CM | POA: Insufficient documentation

## 2023-09-01 DIAGNOSIS — Z853 Personal history of malignant neoplasm of breast: Secondary | ICD-10-CM | POA: Insufficient documentation

## 2023-09-26 ENCOUNTER — Encounter: Payer: Self-pay | Admitting: Acute Care

## 2023-10-13 ENCOUNTER — Ambulatory Visit
Admission: RE | Admit: 2023-10-13 | Discharge: 2023-10-13 | Disposition: A | Discharge status: Home / Self Care | Source: Ambulatory Visit | Attending: Acute Care | Admitting: Acute Care

## 2023-10-13 DIAGNOSIS — R911 Solitary pulmonary nodule: Secondary | ICD-10-CM | POA: Diagnosis present

## 2023-10-23 ENCOUNTER — Telehealth: Payer: Self-pay | Admitting: Acute Care

## 2023-10-23 ENCOUNTER — Other Ambulatory Visit: Payer: Self-pay | Admitting: Acute Care

## 2023-10-23 DIAGNOSIS — R911 Solitary pulmonary nodule: Secondary | ICD-10-CM

## 2023-10-23 NOTE — Telephone Encounter (Signed)
 I have called the patient with the results of her low dose CT Chest. It was read as a 4 B. There has been significant growth since the March 2025 scan. This is highly concerning. Plan is for  a PET scan and follow up with one of the pulmonary interventionists to review results.PET has been ordered.  Please let PCP know results and plan for care.

## 2023-10-23 NOTE — Telephone Encounter (Signed)
 Call report:   IMPRESSION: 1. Enlarging 12.9 mm spiculated mixed attenuation nodule in the apical left upper lobe. Lung-RADS 4X, highly suspicious. Additional imaging evaluation or consultation with Pulmonology or Thoracic Surgery recommended. These results will be called to the ordering clinician or representative by the Radiologist Assistant, and communication documented in the PACS or [employer]. 2. Enlarged, heterogeneous and nodular thyroid . Previous thyroid  ultrasound 05/24/2013 and biopsy 06/05/2013. Please correlate clinically and consider repeat ultrasound, as clinically indicated. 3. 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 4. Liver appears cirrhotic. 5. Chronic gastric wall thickening. 6. Aortic atherosclerosis (ICD10-I70.0). Coronary artery calcification. 7. Enlarged pulmonic trunk, indicative of pulmonary arterial hypertension. 8.  Emphysema (ICD10-J43.9).     Electronically Signed   By: Newell Eke M.D.   On: 10/23/2023 09:57

## 2023-10-23 NOTE — Telephone Encounter (Signed)
 See other telephone note from 10/23/23.

## 2023-10-23 NOTE — Telephone Encounter (Signed)
 Results/ plans sent to PCP. Reminder set to follow up and [place] f/u appt.

## 2023-10-24 ENCOUNTER — Encounter: Payer: Self-pay | Admitting: Pulmonary Disease

## 2023-10-24 ENCOUNTER — Telehealth: Payer: Self-pay

## 2023-10-24 ENCOUNTER — Ambulatory Visit: Admitting: Pulmonary Disease

## 2023-10-24 VITALS — BP 116/66 | HR 73 | Temp 97.7°F | Ht 60.0 in | Wt 199.6 lb

## 2023-10-24 DIAGNOSIS — C50212 Malignant neoplasm of upper-inner quadrant of left female breast: Secondary | ICD-10-CM

## 2023-10-24 DIAGNOSIS — Z950 Presence of cardiac pacemaker: Secondary | ICD-10-CM

## 2023-10-24 DIAGNOSIS — Z170 Estrogen receptor positive status [ER+]: Secondary | ICD-10-CM

## 2023-10-24 DIAGNOSIS — R911 Solitary pulmonary nodule: Secondary | ICD-10-CM | POA: Diagnosis not present

## 2023-10-24 DIAGNOSIS — F17210 Nicotine dependence, cigarettes, uncomplicated: Secondary | ICD-10-CM

## 2023-10-24 NOTE — H&P (View-Only) (Signed)
 Subjective:    Patient ID: Brenda Combs, female    DOB: 03-24-1945, 78 y.o.   MRN: [id number]  Patient Care Team: [name], PA as PCP - General (Physician Assistant) [name], MD as Consulting Physician (Hematology and Oncology) [name], MD as Consulting Physician (Radiation Oncology) Rodolph Romano, MD as Consulting Physician (General Surgery) Verdene Gills, RN as Oncology Nurse Navigator  Chief Complaint  Patient presents with   Consult    Shortness of breath on exertion.    BACKGROUND: Patient is a 78 year old current smoker with a history as noted below who presents for evaluation of a left lung nodule noted on lung cancer screening CT.  She is kindly referred by Eva Marikay, PA.  She is followed by Dr. Call Babara for a history of left breast cancer   HPI Discussed the use of AI scribe software for clinical note transcription with the patient, who gave verbal consent to proceed.  History of Present Illness   Brenda Combs is a 78 year old female with a history of breast cancer and a pacemaker who presents for evaluation of a lung nodule. She is accompanied by her daughter, Sharlet.  A lung nodule was identified in the left lung in March 2025 and has shown slow growth over the past six months. She is scheduled for a PET CT on October 31, 2023, to further evaluate the nodule.  She has a history of left-sided breast cancer, which is relevant to the current evaluation of the lung nodule. 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 A recent echocardiogram in February 2025 showed good heart function.  She continues to smoke approximately a pack a week, although she does not keep an exact count. She experiences shortness of breath when walking short distances but denies any swelling in her legs or ankles.  Her current medications include aspirin , and she denies any use of blood  thinners.   I reviewed imaging with the patient and her daughter and discussed next steps which would be PET/CT plus biopsy.  The procedure of choice in the situation would be robotic assisted navigational bronchoscopy with endobronchial ultrasound for mediastinal staging.  The potential benefits, limitations and complications of the procedure were discussed with the patient and her daughter.  All questions were asked to their satisfaction.     Review of Systems A 10 point review of systems was performed and it is as noted above otherwise negative.   Past Medical History:  Diagnosis Date   Arthritis    Bilateral carotid artery stenosis    CAD (coronary artery disease)    GERD (gastroesophageal reflux disease)    Gout    Heart murmur    History of abnormal Pap smear    Hyperlipidemia    Hypertension    LBBB (left bundle branch block) 01/20/2021   a.) noted on preoperative ECG   NICM (nonischemic cardiomyopathy) (HCC) 02/15/2021   a.) TTE 02/15/2021: ED 35-40%; mod LV systolic dysfunction with LVH; mild panvalvular regurgitation. b.) Lexiscan 02/15/2021: EF 21%; global LV systolic dysfunction; no significant ischemia.   SOB (shortness of breath) on exertion     Past Surgical History:  Procedure Laterality Date   APPENDECTOMY     BREAST BIOPSY Left 01/07/2021   stereo bx calcs, coil marker, PAPILLARY LESION WITH ATYPIA- CALCIFICATIONS ARE PRESENT. 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, Microinvasive carcinoma   COLONOSCOPY WITH  PROPOFOL  N/A 11/23/2015   Procedure: COLONOSCOPY WITH PROPOFOL ;  Surgeon: Lamar ONEIDA Holmes, MD;  Location: [facility] ENDOSCOPY;  Service: Endoscopy;  Laterality: N/A;   COLONOSCOPY WITH PROPOFOL  N/A 02/07/2017   Procedure: COLONOSCOPY WITH PROPOFOL ;  Surgeon: Gaylyn Gladis PENNER, MD;  Location: [facility]COPY;  Service: Endoscopy;  Laterality: N/A;   ESOPHAGOGASTRODUODENOSCOPY (EGD) WITH PROPOFOL  N/A 11/23/2015   Procedure: ESOPHAGOGASTRODUODENOSCOPY (EGD) WITH PROPOFOL ;  Surgeon: Lamar ONEIDA Holmes, MD;  Location: [facility] ENDOSCOPY;  Service: Endoscopy;  Laterality: N/A;   PACEMAKER IMPLANT N/A 03/27/2023   Procedure: PACEMAKER IMPLANT;  Surgeon: Ammon Blunt, MD;  Location: [facility] INVASIVE CV LAB;  Service: Cardiovascular;  Laterality: N/A;  DDD pacer   TEMPORARY PACEMAKER N/A 03/27/2023   Procedure: TEMPORARY PACEMAKER;  Surgeon: Ammon Blunt, MD;  Location: [facility] INVASIVE CV LAB;  Service: Cardiovascular;  Laterality: N/A;   TUBAL LIGATION     WISDOM TOOTH EXTRACTION     x 2    Patient Active Problem List   Diagnosis Date Noted   Lung nodule seen on imaging study 10/24/2023   Symptomatic sinus bradycardia 03/26/2023   Complete heart block (HCC) 03/26/2023   Hypocalcemia 06/15/2022   Nicotine dependence 03/09/2021   Gastritis 03/09/2021   Malignant neoplasm of upper-inner quadrant of left breast in female, estrogen receptor positive (HCC) 03/09/2021   Goals of care, counseling/discussion 03/09/2021   Cardiomyopathy, nonischemic (HCC) 02/23/2021   Coronary artery disease involving native coronary artery of native heart 01/26/2021   Bilateral carotid artery stenosis 01/26/2021   Osteopenia 04/13/2015   Hypokalemia 04/13/2015   Allergic rhinitis 04/13/2015   Thyroid  nodule 04/13/2015   Vitamin D deficiency 04/13/2015   GERD (gastroesophageal reflux disease) 04/13/2015   Plantar fasciitis 04/13/2015   Gout 11/20/2013   Hypertension 06/29/2011   Hypercholesteremia 06/29/2011    Obesity 06/29/2011   Former smoker 06/29/2011   Heart murmur    Arthritis     Family History  Problem Relation Age of Onset   Arthritis Mother    Hyperlipidemia Mother    Hypertension Mother    Hyperlipidemia Sister    Hypertension Sister    Hypertension Daughter    Diabetes Sister    Hyperlipidemia Sister    Hypertension Sister    Hypertension Daughter    Hypertension Daughter    Breast cancer Neg Hx     Social History   Tobacco Use   Smoking status: Every Day    Current packs/day: 0.25    Average packs/day: 0.7 packs/day for 41.8 years (30.5 ttl pk-yrs)    Types: Cigarettes    Start date: 02/28/1981    Last attempt to quit: 02/28/2021   Smokeless tobacco: Never  Substance Use Topics   Alcohol use: No    Allergies  Allergen Reactions   Jardiance [Empagliflozin]     Abdominal pain   Augmentin [Amoxicillin-Pot Clavulanate] Diarrhea   Zyrtec [Cetirizine Hcl]     depression    Current Meds  Medication Sig   allopurinol  (ZYLOPRIM ) 100 MG tablet Take 100 mg by mouth daily.   amLODipine (NORVASC) 5 MG tablet Take 7.5 mg by  mouth.   aspirin  81 MG tablet Take 81 mg by mouth daily.   atorvastatin  (LIPITOR) 40 MG tablet Take 40 mg by mouth daily.   Calcium  Carbonate-Vit D-Min (CALCIUM  1200 PO) Take by mouth. (Patient taking differently: Take by mouth. 1200 mg in the morning and 600 mg in the evening.)   Cyanocobalamin (VITAMIN B-12) 500 MCG SUBL Place 1 tablet under the tongue daily at 6 (six) AM.   cyclobenzaprine (FLEXERIL) 5 MG tablet Take 5 mg by mouth 2 (two) times daily as needed for muscle spasms.   ENTRESTO  49-51 MG Take 1 tablet by mouth 2 (two) times daily.   fluticasone (FLONASE) 50 MCG/ACT nasal spray Place 1 spray into both nostrils daily.   furosemide (LASIX) 20 MG tablet Take 20 mg by mouth every other day.   labetalol  (NORMODYNE ) 200 MG tablet Take 200 mg by mouth 2 (two) times daily.   magnesium  oxide (MAG-OX) 400 (240 Mg) MG tablet Take 400 mg by mouth  daily.   omeprazole (PRILOSEC) 40 MG capsule Take 40 mg by mouth 2 (two) times daily.   spironolactone  (ALDACTONE ) 25 MG tablet Take 25 mg by mouth daily.    Immunization History  Administered Date(s) Administered   PNEUMOCOCCAL CONJUGATE-20 03/28/2023        Objective:     BP 116/66   Pulse 73   Temp 97.7 F (36.5 C) (Temporal)   Ht 5' (1.524 m)   Wt 199 lb 9.6 oz (90.5 kg)   SpO2 99%   BMI 38.98 kg/m   SpO2: 99 %  GENERAL: Obese, well-developed woman, no acute distress.  No conversational dyspnea.  Fully ambulatory. HEAD: Normocephalic, atraumatic.  EYES: Pupils equal, round, reactive to light.  No scleral icterus.  MOUTH: Dentition intact, oral mucosa moist.  No thrush. NECK: Supple. No thyromegaly. Trachea midline. No JVD.  No adenopathy. PULMONARY: Good air entry bilaterally.  No adventitious sounds. CARDIOVASCULAR: S1 and S2. Regular rate and rhythm.  No rubs, murmurs or gallops heard. ABDOMEN: Obese, otherwise benign. MUSCULOSKELETAL: No joint deformity, no clubbing, no edema.  NEUROLOGIC: No overt focal deficit, no gait disturbance, speech is fluent. SKIN: Intact,warm,dry. PSYCH: Mood and behavior normal.   Representative images from CT chest performed 13 October 2023 showing a mixed attenuation nodule on the left upper lobe 12.9 mm in diameter (arrows):     Assessment & Plan:     ICD-10-CM   1. Lung nodule seen on imaging study  R91.1 CT SUPER D CHEST WO [facility] PILOT    2. Malignant neoplasm of upper-inner quadrant of left breast in female, estrogen receptor positive (HCC)  C50.212    Z17.0     3. Cigarette smoker  F17.210     4. S/P placement of leadless cardiac pacemaker  Z95.0       Orders Placed This Encounter  Procedures   CT SUPER D CHEST WO [facility] PILOT    Standing Status:   Future    Expiration Date:   10/23/2024    Scheduling Instructions:     Please do on 30 Sept if possible Patient has PET CT that day. Robotic procedure will be  6 October.    Preferred imaging location?:   [facility]   Discussion:    Left upper lobe lung nodule, increasing in size A left upper lobe lung nodule has increased in size over the past six months, appearing slow-growing with a cotton candy-like appearance on imaging. Differential diagnosis includes a slow-growing tumor or inflammation. -  Perform PET CT on September 30 to assess nodule activity and lymph node involvement - Schedule robotic-assisted biopsy of the lung nodule on October 6, endobronchial ultrasound for mediastinal staging - Order additional scan to map airways for robotic biopsy - Ensure PET CT and planning scan are done on the same day to minimize visits - Discuss potential risks of biopsy, including lung collapse (1-3% risk) and possible overnight hospital stay if complications arise - Ensure she is aware of the procedure details, including anesthesia and breathing support during biopsy  Nicotine dependence (current smoker) Current smoker, approximately a pack a week, using smoking as a relaxation method. Smoking may contribute to respiratory symptoms and complicate lung health.     Follow-up in 4 weeks time call sooner should any new problems arise.  Advised if symptoms do not improve or worsen, to please contact office for sooner follow up or seek emergency care.    I spent 45 minutes of dedicated to the care of this patient on the date of this encounter to include pre-visit review of records, face-to-face time with the patient discussing conditions above, post visit ordering of testing, clinical documentation with the electronic health record, making appropriate referrals as documented, and communicating necessary findings to members of the patients care team.   C. Leita Sanders, MD Advanced Bronchoscopy [facility]    *This note was dictated using voice recognition software/Dragon.  Despite best efforts to proofread, errors can occur  which can change the meaning. Any transcriptional errors that result from this process are unintentional and may not be fully corrected at the time of dictation.

## 2023-10-24 NOTE — Progress Notes (Signed)
 Subjective:    Patient ID: Brenda Combs, female    DOB: 03-24-1945, 78 y.o.   MRN: [id number]  Patient Care Team: [name], PA as PCP - General (Physician Assistant) [name], MD as Consulting Physician (Hematology and Oncology) [name], MD as Consulting Physician (Radiation Oncology) Rodolph Romano, MD as Consulting Physician (General Surgery) Verdene Gills, RN as Oncology Nurse Navigator  Chief Complaint  Patient presents with   Consult    Shortness of breath on exertion.    BACKGROUND: Patient is a 78 year old current smoker with a history as noted below who presents for evaluation of a left lung nodule noted on lung cancer screening CT.  She is kindly referred by Eva Marikay, PA.  She is followed by Dr. Call Babara for a history of left breast cancer   HPI Discussed the use of AI scribe software for clinical note transcription with the patient, who gave verbal consent to proceed.  History of Present Illness   Brenda Combs is a 78 year old female with a history of breast cancer and a pacemaker who presents for evaluation of a lung nodule. She is accompanied by her daughter, Sharlet.  A lung nodule was identified in the left lung in March 2025 and has shown slow growth over the past six months. She is scheduled for a PET CT on October 31, 2023, to further evaluate the nodule.  She has a history of left-sided breast cancer, which is relevant to the current evaluation of the lung nodule. 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 A recent echocardiogram in February 2025 showed good heart function.  She continues to smoke approximately a pack a week, although she does not keep an exact count. She experiences shortness of breath when walking short distances but denies any swelling in her legs or ankles.  Her current medications include aspirin , and she denies any use of blood  thinners.   I reviewed imaging with the patient and her daughter and discussed next steps which would be PET/CT plus biopsy.  The procedure of choice in the situation would be robotic assisted navigational bronchoscopy with endobronchial ultrasound for mediastinal staging.  The potential benefits, limitations and complications of the procedure were discussed with the patient and her daughter.  All questions were asked to their satisfaction.     Review of Systems A 10 point review of systems was performed and it is as noted above otherwise negative.   Past Medical History:  Diagnosis Date   Arthritis    Bilateral carotid artery stenosis    CAD (coronary artery disease)    GERD (gastroesophageal reflux disease)    Gout    Heart murmur    History of abnormal Pap smear    Hyperlipidemia    Hypertension    LBBB (left bundle branch block) 01/20/2021   a.) noted on preoperative ECG   NICM (nonischemic cardiomyopathy) (HCC) 02/15/2021   a.) TTE 02/15/2021: ED 35-40%; mod LV systolic dysfunction with LVH; mild panvalvular regurgitation. b.) Lexiscan 02/15/2021: EF 21%; global LV systolic dysfunction; no significant ischemia.   SOB (shortness of breath) on exertion     Past Surgical History:  Procedure Laterality Date   APPENDECTOMY     BREAST BIOPSY Left 01/07/2021   stereo bx calcs, coil marker, PAPILLARY LESION WITH ATYPIA- CALCIFICATIONS ARE PRESENT. 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, Microinvasive carcinoma   COLONOSCOPY WITH  PROPOFOL  N/A 11/23/2015   Procedure: COLONOSCOPY WITH PROPOFOL ;  Surgeon: Lamar ONEIDA Holmes, MD;  Location: [facility] ENDOSCOPY;  Service: Endoscopy;  Laterality: N/A;   COLONOSCOPY WITH PROPOFOL  N/A 02/07/2017   Procedure: COLONOSCOPY WITH PROPOFOL ;  Surgeon: Gaylyn Gladis PENNER, MD;  Location: [facility];  Service: Endoscopy;  Laterality: N/A;   ESOPHAGOGASTRODUODENOSCOPY (EGD) WITH PROPOFOL  N/A 11/23/2015   Procedure: ESOPHAGOGASTRODUODENOSCOPY (EGD) WITH PROPOFOL ;  Surgeon: Lamar ONEIDA Holmes, MD;  Location: [facility] ENDOSCOPY;  Service: Endoscopy;  Laterality: N/A;   PACEMAKER IMPLANT N/A 03/27/2023   Procedure: PACEMAKER IMPLANT;  Surgeon: Ammon Blunt, MD;  Location: [facility] INVASIVE CV LAB;  Service: Cardiovascular;  Laterality: N/A;  DDD pacer   TEMPORARY PACEMAKER N/A 03/27/2023   Procedure: TEMPORARY PACEMAKER;  Surgeon: Ammon Blunt, MD;  Location: [facility] INVASIVE CV LAB;  Service: Cardiovascular;  Laterality: N/A;   TUBAL LIGATION     WISDOM TOOTH EXTRACTION     x 2    Patient Active Problem List   Diagnosis Date Noted   Lung nodule seen on imaging study 10/24/2023   Symptomatic sinus bradycardia 03/26/2023   Complete heart block (HCC) 03/26/2023   Hypocalcemia 06/15/2022   Nicotine dependence 03/09/2021   Gastritis 03/09/2021   Malignant neoplasm of upper-inner quadrant of left breast in female, estrogen receptor positive (HCC) 03/09/2021   Goals of care, counseling/discussion 03/09/2021   Cardiomyopathy, nonischemic (HCC) 02/23/2021   Coronary artery disease involving native coronary artery of native heart 01/26/2021   Bilateral carotid artery stenosis 01/26/2021   Osteopenia 04/13/2015   Hypokalemia 04/13/2015   Allergic rhinitis 04/13/2015   Thyroid  nodule 04/13/2015   Vitamin D deficiency 04/13/2015   GERD (gastroesophageal reflux disease) 04/13/2015   Plantar fasciitis 04/13/2015   Gout 11/20/2013   Hypertension 06/29/2011   Hypercholesteremia 06/29/2011    Obesity 06/29/2011   Former smoker 06/29/2011   Heart murmur    Arthritis     Family History  Problem Relation Age of Onset   Arthritis Mother    Hyperlipidemia Mother    Hypertension Mother    Hyperlipidemia Sister    Hypertension Sister    Hypertension Daughter    Diabetes Sister    Hyperlipidemia Sister    Hypertension Sister    Hypertension Daughter    Hypertension Daughter    Breast cancer Neg Hx     Social History   Tobacco Use   Smoking status: Every Day    Current packs/day: 0.25    Average packs/day: 0.7 packs/day for 41.8 years (30.5 ttl pk-yrs)    Types: Cigarettes    Start date: 02/28/1981    Last attempt to quit: 02/28/2021   Smokeless tobacco: Never  Substance Use Topics   Alcohol use: No    Allergies  Allergen Reactions   Jardiance [Empagliflozin]     Abdominal pain   Augmentin [Amoxicillin-Pot Clavulanate] Diarrhea   Zyrtec [Cetirizine Hcl]     depression    Current Meds  Medication Sig   allopurinol  (ZYLOPRIM ) 100 MG tablet Take 100 mg by mouth daily.   amLODipine (NORVASC) 5 MG tablet Take 7.5 mg by  mouth.   aspirin  81 MG tablet Take 81 mg by mouth daily.   atorvastatin  (LIPITOR) 40 MG tablet Take 40 mg by mouth daily.   Calcium  Carbonate-Vit D-Min (CALCIUM  1200 PO) Take by mouth. (Patient taking differently: Take by mouth. 1200 mg in the morning and 600 mg in the evening.)   Cyanocobalamin (VITAMIN B-12) 500 MCG SUBL Place 1 tablet under the tongue daily at 6 (six) AM.   cyclobenzaprine (FLEXERIL) 5 MG tablet Take 5 mg by mouth 2 (two) times daily as needed for muscle spasms.   ENTRESTO  49-51 MG Take 1 tablet by mouth 2 (two) times daily.   fluticasone (FLONASE) 50 MCG/ACT nasal spray Place 1 spray into both nostrils daily.   furosemide (LASIX) 20 MG tablet Take 20 mg by mouth every other day.   labetalol  (NORMODYNE ) 200 MG tablet Take 200 mg by mouth 2 (two) times daily.   magnesium  oxide (MAG-OX) 400 (240 Mg) MG tablet Take 400 mg by mouth  daily.   omeprazole (PRILOSEC) 40 MG capsule Take 40 mg by mouth 2 (two) times daily.   spironolactone  (ALDACTONE ) 25 MG tablet Take 25 mg by mouth daily.    Immunization History  Administered Date(s) Administered   PNEUMOCOCCAL CONJUGATE-20 03/28/2023        Objective:     BP 116/66   Pulse 73   Temp 97.7 F (36.5 C) (Temporal)   Ht 5' (1.524 m)   Wt 199 lb 9.6 oz (90.5 kg)   SpO2 99%   BMI 38.98 kg/m   SpO2: 99 %  GENERAL: Obese, well-developed woman, no acute distress.  No conversational dyspnea.  Fully ambulatory. HEAD: Normocephalic, atraumatic.  EYES: Pupils equal, round, reactive to light.  No scleral icterus.  MOUTH: Dentition intact, oral mucosa moist.  No thrush. NECK: Supple. No thyromegaly. Trachea midline. No JVD.  No adenopathy. PULMONARY: Good air entry bilaterally.  No adventitious sounds. CARDIOVASCULAR: S1 and S2. Regular rate and rhythm.  No rubs, murmurs or gallops heard. ABDOMEN: Obese, otherwise benign. MUSCULOSKELETAL: No joint deformity, no clubbing, no edema.  NEUROLOGIC: No overt focal deficit, no gait disturbance, speech is fluent. SKIN: Intact,warm,dry. PSYCH: Mood and behavior normal.   Representative images from CT chest performed 13 October 2023 showing a mixed attenuation nodule on the left upper lobe 12.9 mm in diameter (arrows):     Assessment & Plan:     ICD-10-CM   1. Lung nodule seen on imaging study  R91.1 CT SUPER D CHEST WO [facility] PILOT    2. Malignant neoplasm of upper-inner quadrant of left breast in female, estrogen receptor positive (HCC)  C50.212    Z17.0     3. Cigarette smoker  F17.210     4. S/P placement of leadless cardiac pacemaker  Z95.0       Orders Placed This Encounter  Procedures   CT SUPER D CHEST WO [facility] PILOT    Standing Status:   Future    Expiration Date:   10/23/2024    Scheduling Instructions:     Please do on 30 Sept if possible Patient has PET CT that day. Robotic procedure will be  6 October.    Preferred imaging location?:   [facility]   Discussion:    Left upper lobe lung nodule, increasing in size A left upper lobe lung nodule has increased in size over the past six months, appearing slow-growing with a cotton candy-like appearance on imaging. Differential diagnosis includes a slow-growing tumor or inflammation. -  Perform PET CT on September 30 to assess nodule activity and lymph node involvement - Schedule robotic-assisted biopsy of the lung nodule on October 6, endobronchial ultrasound for mediastinal staging - Order additional scan to map airways for robotic biopsy - Ensure PET CT and planning scan are done on the same day to minimize visits - Discuss potential risks of biopsy, including lung collapse (1-3% risk) and possible overnight hospital stay if complications arise - Ensure she is aware of the procedure details, including anesthesia and breathing support during biopsy  Nicotine dependence (current smoker) Current smoker, approximately a pack a week, using smoking as a relaxation method. Smoking may contribute to respiratory symptoms and complicate lung health.     Follow-up in 4 weeks time call sooner should any new problems arise.  Advised if symptoms do not improve or worsen, to please contact office for sooner follow up or seek emergency care.    I spent 45 minutes of dedicated to the care of this patient on the date of this encounter to include pre-visit review of records, face-to-face time with the patient discussing conditions above, post visit ordering of testing, clinical documentation with the electronic health record, making appropriate referrals as documented, and communicating necessary findings to members of the patients care team.   C. Leita Sanders, MD Advanced Bronchoscopy [facility]    *This note was dictated using voice recognition software/Dragon.  Despite best efforts to proofread, errors can occur  which can change the meaning. Any transcriptional errors that result from this process are unintentional and may not be fully corrected at the time of dictation.

## 2023-10-24 NOTE — Patient Instructions (Addendum)
 VISIT SUMMARY:  Today, we discussed the evaluation of a lung nodule that has shown slow growth over the past six months. You have a history of breast cancer and a pacemaker, and you continue to smoke approximately a pack a week. We reviewed your current medications and symptoms, including shortness of breath when walking short distances.  YOUR PLAN:  -LEFT UPPER LOBE LUNG NODULE: A lung nodule is a small growth in the lung that can be benign or malignant. Your nodule has increased in size, and we need to perform further tests to understand its nature. You are scheduled for a PET CT on September 30 to assess the nodule's activity and lymph node involvement. We will also perform a robotic-assisted biopsy on October 6 to obtain a tissue sample. An additional scan will be done to map your airways for the biopsy. We discussed the potential risks of the biopsy, including a small risk of lung collapse and the possibility of an overnight hospital stay if complications arise. You will be given anesthesia and breathing support during the procedure.  -NICOTINE DEPENDENCE (CURRENT SMOKER): Nicotine dependence means you are addicted to smoking, which can harm your lungs and overall health. You currently smoke about a pack a week. Smoking can contribute to your shortness of breath and complicate lung health. We recommend considering smoking cessation options to improve your respiratory symptoms and overall health.  INSTRUCTIONS:  Please ensure you attend your PET CT scan on September 30 and the robotic-assisted biopsy on October 6. If you have any questions or concerns about the procedures, feel free to contact our office.   We discussed that the procedure would have to be done under general anesthesia. The anesthesia team will discuss his part of the process with you.  Complications from the procedure itself are usually minor. One potential complication would be collapse of the lung which can occur in 2-3% of the  cases. If  this happens we would have to put a small tube to relieve the collapse and you would have to spend the night in the hospital in that event.  Another possibility would be that of bleeding, this is usually taken care of during the procedure.  In the situations you may need to be observed overnight.  For the most part though, should be able to go home the same day.  Other possibilities could include that the procedure would be nondiagnostic meaning that no definitive diagnosis could be attained.  We strive to try to decrease these potential issues.

## 2023-10-24 NOTE — Telephone Encounter (Signed)
 Robotic Bronchoscopy with EBUS 11/06/2023 9:20am R91.1 CPT Code: 68372, [id number], [id number] Brenda Combs see Bronch info.

## 2023-10-25 NOTE — Telephone Encounter (Signed)
 For the codes 68372, [id number], [phone] Prior Auth Not Required Refer # [id number] Joe

## 2023-10-30 ENCOUNTER — Inpatient Hospital Stay: Admission: RE | Admit: 2023-10-30 | Source: Ambulatory Visit

## 2023-10-31 ENCOUNTER — Ambulatory Visit
Admission: RE | Admit: 2023-10-31 | Discharge: 2023-10-31 | Disposition: A | Discharge status: Home / Self Care | Source: Ambulatory Visit | Attending: Pulmonary Disease | Admitting: Pulmonary Disease

## 2023-10-31 ENCOUNTER — Ambulatory Visit
Admission: RE | Admit: 2023-10-31 | Discharge: 2023-10-31 | Disposition: A | Discharge status: Home / Self Care | Source: Ambulatory Visit | Attending: Acute Care | Admitting: Acute Care

## 2023-10-31 DIAGNOSIS — K118 Other diseases of salivary glands: Secondary | ICD-10-CM | POA: Insufficient documentation

## 2023-10-31 DIAGNOSIS — I700 Atherosclerosis of aorta: Secondary | ICD-10-CM | POA: Insufficient documentation

## 2023-10-31 DIAGNOSIS — I288 Other diseases of pulmonary vessels: Secondary | ICD-10-CM | POA: Diagnosis not present

## 2023-10-31 DIAGNOSIS — R911 Solitary pulmonary nodule: Secondary | ICD-10-CM

## 2023-10-31 DIAGNOSIS — I2510 Atherosclerotic heart disease of native coronary artery without angina pectoris: Secondary | ICD-10-CM | POA: Insufficient documentation

## 2023-10-31 DIAGNOSIS — I517 Cardiomegaly: Secondary | ICD-10-CM | POA: Diagnosis not present

## 2023-10-31 LAB — GLUCOSE, CAPILLARY: Glucose-Capillary: 83 mg/dL (ref 70–99)

## 2023-10-31 MED ORDER — FLUDEOXYGLUCOSE F - 18 (FDG) INJECTION
11.1800 | Freq: Once | INTRAVENOUS | Status: AC | PRN
Start: 2023-10-31 — End: 2023-10-31
  Administered 2023-10-31: 11.18 via INTRAVENOUS

## 2023-11-01 ENCOUNTER — Encounter
Admission: RE | Admit: 2023-11-01 | Discharge: 2023-11-01 | Disposition: A | Discharge status: Home / Self Care | Source: Ambulatory Visit | Attending: Pulmonary Disease | Admitting: Pulmonary Disease

## 2023-11-01 ENCOUNTER — Other Ambulatory Visit: Payer: Self-pay

## 2023-11-01 DIAGNOSIS — D508 Other iron deficiency anemias: Secondary | ICD-10-CM

## 2023-11-01 DIAGNOSIS — I10 Essential (primary) hypertension: Secondary | ICD-10-CM

## 2023-11-01 HISTORY — DX: Thoracic aortic ectasia: I77.810

## 2023-11-01 HISTORY — DX: Dyspnea, unspecified: R06.00

## 2023-11-01 HISTORY — DX: Anemia, unspecified: D64.9

## 2023-11-01 HISTORY — DX: Other cardiomyopathies: I42.8

## 2023-11-01 HISTORY — DX: Presence of cardiac pacemaker: Z95.0

## 2023-11-01 HISTORY — DX: Malignant (primary) neoplasm, unspecified: C80.1

## 2023-11-01 NOTE — Patient Instructions (Addendum)
 Your procedure is scheduled on: 11/06/23  Report to the Registration Desk on the 1st floor of the [facility]. To find out your arrival time, please call [phone] between 1PM - 3PM on: 11/03/23  If your arrival time is 6:00 am, do not arrive before that time as the [facility] entrance doors do not open until 6:00 am.  REMEMBER: Instructions that are not followed completely may result in serious medical risk, up to and including death; or upon the discretion of your surgeon and anesthesiologist your surgery may need to be rescheduled.  Do not eat food or no liquids after midnight the night before surgery.  No gum chewing or hard candies.  One week prior to surgery: Stop Anti-inflammatories (NSAIDS) such as Advil, Aleve, Ibuprofen, Motrin, Naproxen, Naprosyn and Aspirin  based products such as Excedrin, Goody's Powder, BC Powder. You may take Tylenol  if needed for pain up until the day of surgery.  Stop ANY OVER THE COUNTER supplements until after surgery : Calcium  Carbonate-Vit D-Min   aspirin  81 hold beginning 11/02/23  Continue taking all of your other prescription medications up until the day of surgery.  ON THE DAY OF SURGERY ONLY TAKE THESE MEDICATIONS WITH SIPS OF WATER:  amLODipine (NORVASC)  labetalol  (NORMODYNE )  omeprazole (PRILOSEC)   No Alcohol for 24 hours before or after surgery.  No Smoking including e-cigarettes for 24 hours before surgery.  No chewable tobacco products for at least 6 hours before surgery.  No nicotine patches on the day of surgery.  Do not use any recreational drugs for at least a week (preferably 2 weeks) before your surgery.  Please be advised that the combination of cocaine and anesthesia may have negative outcomes, up to and including death. If you test positive for cocaine, your surgery will be cancelled.  On the morning of surgery brush your teeth with toothpaste and water, you may rinse your mouth with mouthwash if you wish. Do  not swallow any toothpaste or mouthwash.  Do not wear jewelry, make-up, hairpins, clips or nail polish.  For welded (permanent) jewelry: bracelets, anklets, waist bands, etc.  Please have this removed prior to surgery.  If it is not removed, there is a chance that hospital personnel will need to cut it off on the day of surgery.  Do not wear lotions, powders, or perfumes.   Do not shave body hair from the neck down 48 hours before surgery.  Contact lenses, hearing aids and dentures may not be worn into surgery.  Do not bring valuables to the hospital. [facility] is not responsible for any missing/lost belongings or valuables.   Notify your doctor if there is any change in your medical condition (cold, fever, infection).  Wear comfortable clothing (specific to your surgery type) to the hospital.  After surgery, you can help prevent lung complications by doing breathing exercises.  Take deep breaths and cough every 1-2 hours. Your doctor may order a device called an Incentive Spirometer to help you take deep breaths.  When coughing or sneezing, hold a pillow firmly against your incision with both hands. This is called "splinting." Doing this helps protect your incision. It also decreases belly discomfort.  If you are being admitted to the hospital overnight, leave your suitcase in the car. After surgery it may be brought to your room.  In case of increased patient census, it may be necessary for you, the patient, to continue your postoperative care in the Same Day Surgery department.  If you  are being discharged the day of surgery, you will not be allowed to drive home. You will need a responsible individual to drive you home and stay with you for 24 hours after surgery.   If you are taking public transportation, you will need to have a responsible individual with you.  Please call the Pre-admissions Testing Dept. at [phone] if you have any questions about these  instructions.  Surgery Visitation Policy:  Patients having surgery or a procedure may have two visitors.  Children under the age of 21 must have an adult with them who is not the patient.  Inpatient Visitation:    Visiting hours are 7 a.m. to 8 p.m. Up to four visitors are allowed at one time in a patient room. The visitors may rotate out with other people during the day.  One visitor age 30 or older may stay with the patient overnight and must be in the room by 8 p.m.   Merchandiser, retail to address health-related social needs:  [email or web address]

## 2023-11-02 ENCOUNTER — Encounter
Admission: RE | Admit: 2023-11-02 | Discharge: 2023-11-02 | Disposition: A | Discharge status: Home / Self Care | Source: Ambulatory Visit | Attending: Pulmonary Disease | Admitting: Pulmonary Disease

## 2023-11-02 ENCOUNTER — Encounter: Payer: Self-pay | Admitting: Pulmonary Disease

## 2023-11-02 DIAGNOSIS — Z01812 Encounter for preprocedural laboratory examination: Secondary | ICD-10-CM | POA: Insufficient documentation

## 2023-11-02 DIAGNOSIS — I10 Essential (primary) hypertension: Secondary | ICD-10-CM | POA: Diagnosis not present

## 2023-11-02 DIAGNOSIS — Z01818 Encounter for other preprocedural examination: Secondary | ICD-10-CM | POA: Diagnosis present

## 2023-11-02 LAB — CBC
HCT: 36.2 % (ref 36.0–46.0)
Hemoglobin: 11.5 g/dL — ABNORMAL LOW (ref 12.0–15.0)
MCH: 28.2 pg (ref 26.0–34.0)
MCHC: 31.8 g/dL (ref 30.0–36.0)
MCV: 88.7 fL (ref 80.0–100.0)
Platelets: 195 K/uL (ref 150–400)
RBC: 4.08 MIL/uL (ref 3.87–5.11)
RDW: 13.7 % (ref 11.5–15.5)
WBC: 6.1 K/uL (ref 4.0–10.5)
nRBC: 0 % (ref 0.0–0.2)

## 2023-11-02 NOTE — Progress Notes (Signed)
 Perioperative / Anesthesia Services  Pre-Admission Testing Clinical Review / Pre-Operative Anesthesia Consult  Date: 11/02/23  PATIENT DEMOGRAPHICS: Name: Brenda Combs DOB: 02-17-1945 MRN:   [id number]  Note: Available PAT nursing documentation and vital signs have been reviewed. Clinical nursing staff has updated patient's PMH/PSHx, current medication list, and drug allergies/intolerances to ensure complete and comprehensive history available to assist care teams in MDM as it pertains to the aforementioned surgical procedure and anticipated anesthetic course. Extensive review of available clinical information personally performed. [place] [facility] and PSHx updated with any diagnoses/procedures that  may have been inadvertently omitted during her intake with the pre-admission testing department's nursing staff.  PLANNED SURGICAL PROCEDURE(S):   Case: [id number] Date/Time: 11/06/23 0932   Procedures:      VIDEO BRONCHOSCOPY WITH ENDOBRONCHIAL NAVIGATION (Left)     ENDOBRONCHIAL ULTRASOUND (EBUS) (Left)   Anesthesia type: General   Diagnosis: Lung nodule [R91.1]   Pre-op diagnosis: Lung Nodule   Location: [facility] PROCEDURE RM 02 / [facility] ORS FOR ANESTHESIA GROUP   Surgeons: Tamea Dedra CROME, MD        CLINICAL DISCUSSION: Brenda Combs is a 78 y.o. female who is submitted for pre-surgical anesthesia review and clearance prior to her undergoing the above procedure. Patient is a Current Smoker (30.5 pack years). Pertinent PMH includes: CAD, NICM, CHB (s/p PPM placement), LBBB, ascending aorta dilatation, cardiac murmur, LBBB, aortic atherosclerosis, BILATERAL carotid artery stenosis, HTN, HLD, DOE, LEFT upper pulmonary lobe nodule, BILATERAL parotid nodules, LEFT thyroid  nodule, GERD (on daily PPI), anemia, OA, LEFT breast cancer, lumbar spinal stenosis.  Patient is followed by cardiology Andrea, MD). She was last seen in the cardiology clinic on 10/09/2023; notes reviewed. At the time  of her clinic visit, patient doing well overall from a cardiovascular perspective.  Patient with chronic exertional dyspnea that was reported to be stable at baseline.  Patient denied any chest pain, shortness of breath, PND, orthopnea, palpitations, significant peripheral edema, weakness, fatigue, vertiginous symptoms, or presyncope/syncope. Patient with a past medical history significant for cardiovascular diagnoses. Documented physical exam was grossly benign, providing no evidence of acute exacerbation and/or decompensation of the patient's known cardiovascular conditions.  Most recent myocardial perfusion imaging study was performed on 02/15/2021 revealing a  mildly reduced left ventricular systolic function with an EF of 41%.  There were no regional wall motion abnormalities.  No artifact or left ventricular cavity size enlargement appreciated on review of imaging. SPECT images demonstrated no evidence of stress-induced myocardial ischemia or arrhythmia; no scintigraphic evidence of scar.  TID ratio = 0.89  (normal range </= 1.2).  Treadmill ECG indeterminate due to baseline ECG changes (LBBB).  Study determined to be low risk.  Most recent TTE performed on 03/27/2023 revealed a normal left ventricular systolic function with an EF of 55-60%. There were no regional wall motion abnormalities.  Left ventricular diastolic Doppler parameters were normal.  Right ventricular size and function normal. There was trivial mitral and tricuspid valve regurgitation.  All transvalvular gradients were noted to be normal providing no evidence of hemodynamically significant valvular stenosis. Aorta normal in size with no evidence of ectasia or aneurysmal dilatation.  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  [employer] Micra AV 2 (SN: [id number]) device was placed.  Device is regularly interrogated by patient's primary cardiology/electrophysiology team.  Most  recent interrogation was on 10/09/2023, at which time device was noted to be functioning properly.    NICM being managed on GDMT including  CCB (amlodipine), ARB/ARNI (Entresto ), diuretic (furosemide), beta-blocker (labetalol ), and MRA (prior lactone) therapies.  Patient is on atorvastatin  for her HLD diagnosis and further ASCVD prevention.  She is not diabetic.  She does not have an OSAH diagnosis. Patient is able to complete all of her  ADL/IADLs without cardiovascular limitation.  Per the DASI, patient is able to achieve at least 4 METS of physical activity without experiencing any significant degree of angina/anginal equivalent symptoms. No changes were made to her medication regimen during her visit with cardiology.  Patient scheduled to follow-up with outpatient cardiology in 4 months or sooner if needed.  Brenda Combs underwent low-dose lung cancer screening CT of the chest on 04/11/2023 that revealed a new partially solid apical LEFT upper lobe pulmonary nodule that measured 10.8 mm.  In its entirety; 5.8 mm solid component noted.  There were additional pulmonary nodules measuring up to 4.1 mm or less in size.  Outside of the lungs, there was a 3 cm nodule noted in the LEFT thyroid  observed.  Follow-up imaging on 10/13/2023 demonstrated  interval enlargement of the apical nodule to 12.9 mm; findings concerning for bronchogenic carcinoma.  PET imaging performed on 10/31/2023 revealed low-level hypermetabolism in the LEFT upper lobe pulmonary nodule with a maximum SUV of 2.8.  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  Patient was referred to pulmonary medicine for further evaluation and discussions regarding tissue biopsy for diagnostic purposes.  She is subsequent been scheduled for VIDEO  BRONCHOSCOPY WITH ENDOBRONCHIAL NAVIGATION (Left); ENDOBRONCHIAL ULTRASOUND (EBUS) (Left) on 11/06/2023 with Dr. Dedra LITTIE Sanders, MD.   Given patient's past medical history significant for cardiovascular diagnoses, presurgical cardiac clearance was sought by the PAT team. Per cardiology, this patient is optimized for surgery and may proceed with the planned procedural course with a MODERATE risk of significant perioperative cardiovascular complications.  In review of the patient's chart, it is noted that she is on daily oral antithrombotic therapy. Given that patient's past medical history is significant for cardiovascular diagnoses, including but not limited to CAD, pulmonary medicine has cleared patient to continue her daily low dose ASA throughout her perioperative course.  Patient has been updated on these directives from her specialty care providers by the PAT team.  Patient denies previous perioperative complications with anesthesia in the past. In review her EMR, it is noted that patient underwent a general anesthetic course here at [facility] (ASA [facility]) in 01/2021 without documented complications.   MOST RECENT VITAL SIGNS:    10/24/2023   10:49 AM 07/19/2023    9:04 AM 07/03/2023    1:59 PM  Vitals with BMI  Height 5' 0 5' 0   Weight 199 lbs 10 oz 198 lbs 3 oz 201 lbs  BMI 38.98 38.71   Systolic 116 127 855  Diastolic 66 86 70  Pulse 73 94 64   PROVIDERS/SPECIALISTS: NOTE: Primary physician provider listed below. Patient may have been seen by APP or partner within same practice.   PROVIDER ROLE / SPECIALTY LAST SHERLEAN Sanders Dedra LITTIE, MD Pulmonary Medicine (Surgeon) 10/24/2023  [name], [place] Primary Care Provider 08/22/2023  Ammon Blunt, MD Cardiology 10/09/2023  [name], MD Radiation Oncology 07/20/2023  [name], MD Medical Oncology 07/03/2023   ALLERGIES: Allergies  Allergen Reactions  Jardiance  [Empagliflozin]     Abdominal pain   Augmentin [Amoxicillin-Pot Clavulanate] Diarrhea   Zyrtec [Cetirizine Hcl]     depression    CURRENT HOME MEDICATIONS: No current facility-administered medications for this encounter.    allopurinol  (ZYLOPRIM ) 100 MG tablet   amLODipine (NORVASC) 5 MG tablet   aspirin  81 MG tablet   atorvastatin  (LIPITOR) 40 MG tablet   Calcium  Carbonate-Vit D-Min (CALCIUM  1200 PO)   Cyanocobalamin (VITAMIN B-12) 500 MCG SUBL   cyclobenzaprine (FLEXERIL) 5 MG tablet   ENTRESTO  49-51 MG   fluticasone (FLONASE) 50 MCG/ACT nasal spray   furosemide (LASIX) 20 MG tablet   labetalol  (NORMODYNE ) 200 MG tablet   magnesium  oxide (MAG-OX) 400 (240 Mg) MG tablet   omeprazole (PRILOSEC) 40 MG capsule   spironolactone  (ALDACTONE ) 25 MG tablet   HISTORY: Past Medical History:  Diagnosis Date   Allergic rhinitis    Anemia    Aortic atherosclerosis    Arthritis    Ascending aorta dilatation    Bilateral carotid artery stenosis    Bilateral cataracts    Breast cancer, left (HCC)    a.) s/p partial mastectomy + adjuvant XRT   CAD (coronary artery disease)    Colon polyps    Complete heart block (HCC)    a.) s/p leadless PPM placement 03/27/2023   Dyspnea    GERD (gastroesophageal reflux disease)    Gout    Heart murmur    History of abnormal Pap smear    Hyperlipidemia    Hypertension    LBBB (left bundle branch block) 01/20/2021   a.) noted on preoperative ECG   Left upper lobe pulmonary nodule    Long-term use of aspirin  therapy    NICM (nonischemic cardiomyopathy) (HCC) 02/15/2021   a.) TTE 02/15/2021: ED 35-40%; mod LV systolic dysfunction with LVH; mild panvalvular regurgitation. b.) Lexiscan 02/15/2021: EF 21%; global LV systolic dysfunction; no significant ischemia.   Nicotine dependence    Nodule of parotid gland (BILATERAL)    Presence of leadless cardiac pacemaker 03/27/2023   a.) MDT Micra-AV2 (SN: [id number])   SOB (shortness of breath) on  exertion    Vitamin D deficiency    Past Surgical History:  Procedure Laterality Date   APPENDECTOMY     BREAST BIOPSY Left 01/07/2021   stereo bx calcs, coil marker, PAPILLARY LESION WITH ATYPIA- CALCIFICATIONS ARE PRESENT. 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, Microinvasive carcinoma   COLONOSCOPY WITH PROPOFOL  N/A 11/23/2015   Procedure: COLONOSCOPY WITH PROPOFOL ;  Surgeon: Lamar ONEIDA Holmes, MD;  Location: [facility] ENDOSCOPY;  Service: Endoscopy;  Laterality: N/A;   COLONOSCOPY WITH PROPOFOL  N/A 02/07/2017   Procedure: COLONOSCOPY WITH PROPOFOL ;  Surgeon: Gaylyn Gladis PENNER, MD;  Location: [facility] ENDOSCOPY;  Service: Endoscopy;  Laterality: N/A;   ESOPHAGOGASTRODUODENOSCOPY (EGD) WITH PROPOFOL  N/A 11/23/2015   Procedure: ESOPHAGOGASTRODUODENOSCOPY (EGD) WITH PROPOFOL ;  Surgeon: Lamar ONEIDA Holmes, MD;  Location: [facility] ENDOSCOPY;  Service: Endoscopy;  Laterality: N/A;   PACEMAKER IMPLANT N/A 03/27/2023   Procedure: PACEMAKER IMPLANT;  Surgeon: Ammon Blunt, MD;  Location: [facility] INVASIVE CV LAB;  Service: Cardiovascular;  Laterality: N/A;  DDD pacer   TEMPORARY PACEMAKER N/A 03/27/2023   Procedure: TEMPORARY PACEMAKER;  Surgeon: Ammon Blunt, MD;  Location: [facility] INVASIVE CV LAB;  Service: Cardiovascular;  Laterality: N/A;   TUBAL LIGATION     WISDOM TOOTH EXTRACTION     x 2   Family History  Problem Relation Age of Onset   Arthritis Mother    Hyperlipidemia Mother    Hypertension Mother    Hyperlipidemia Sister     Hypertension Sister    Hypertension Daughter    Diabetes Sister    Hyperlipidemia Sister    Hypertension Sister    Hypertension Daughter    Hypertension Daughter    Breast cancer Neg Hx    Social History   Tobacco Use   Smoking status: Every Day    Current packs/day: 0.25    Average packs/day: 0.7 packs/day for 41.8 years (30.5 ttl pk-yrs)    Types: Cigarettes    Start date: 02/28/1981    Last attempt to quit: 02/28/2021   Smokeless tobacco: Never  Substance Use Topics   Alcohol use: No   LABS:  Hospital Outpatient Visit on 11/02/2023  Component Date Value Ref Range Status   WBC 11/02/2023 6.1  4.0 - 10.5 K/uL Final   RBC 11/02/2023 4.08  3.87 - 5.11 MIL/uL Final   Hemoglobin 11/02/2023 11.5 (L)  12.0 - 15.0 g/dL Final   HCT 89/97/7974 36.2  36.0 - 46.0 % Final   MCV 11/02/2023 88.7  80.0 - 100.0 fL Final   MCH 11/02/2023 28.2  26.0 - 34.0 pg Final   MCHC 11/02/2023 31.8  30.0 - 36.0 g/dL Final   RDW 89/97/7974 13.7  11.5 - 15.5 % Final   Platelets 11/02/2023 195  150 - 400 K/uL Final   nRBC 11/02/2023 0.0  0.0 - 0.2 % Final   Performed at [facility], [address] [place], [place] [address]  Hospital Outpatient Visit on 10/31/2023  Component Date Value Ref Range Status   Glucose-Capillary 10/31/2023 83  70 - 99 mg/dL Final   Glucose reference range applies only to samples taken after fasting for at least 8 hours.   Lab Results  Component Value Date   NA 140 07/03/2023   CL 107 07/03/2023   K 3.7 07/03/2023   CO2 23 07/03/2023   BUN 12 07/03/2023   CREATININE 1.04 (H) 07/03/2023   GFRNONAA 55 (L) 07/03/2023   CALCIUM  8.6 (L) 07/03/2023   PHOS 2.5 03/26/2023   ALBUMIN 4.0 07/03/2023   GLUCOSE 118 (H) 07/03/2023    ECG: Date: 07/03/2023  Time ECG obtained: 1006 AM Rate: 61 bpm Rhythm: Atrial sensed ventricular paced rhythm Axis (leads I and aVF): normal Intervals: PR 198 ms. QRS 158 ms. QTc 453 ms. ST segment and T wave changes: No evidence of  acute T wave abnormalities or significant ST segment elevation or depression.  Evidence of a possible, age undetermined, prior infarct:  No Comparison: Previous tracing obtained on 10/14/2022 showed sinus bradycardia with LBBB at a rate of 59 bpm.   IMAGING / PROCEDURES: NM PET IMAGE INITIAL (PI) SKULL BASE TO THIGH performed on 10/31/2023 Mildly hypermetabolic left apical mixed-attenuation pulmonary nodule, 11 mm, SUV 2.8. This is most consistent with stage 1 a primary bronchogenic carcinoma. 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 Consider ENT referral. Pulmonary artery enlargement suggests pulmonary arterial hypertension.  CT CHEST LCS NODULE F/U LOW DOSE WO CONTRAST performed on  10/13/2023 Enlarging 12.9 mm spiculated mixed attenuation nodule in the apical left upper lobe. Lung-RADS 4X, highly suspicious. Additional imaging evaluation or consultation with Pulmonology or Thoracic Surgery recommended. These results will be called to the ordering clinician or representative by the Radiologist Assistant, and communication documented in the PACS or [employer]. Enlarged, heterogeneous and nodular thyroid . Previous thyroid  ultrasound 05/24/2013 and biopsy 06/05/2013. Please correlate clinically and consider repeat ultrasound, as clinically indicated. 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 Liver appears cirrhotic Chronic gastric wall thickening. Aortic atherosclerosis  Coronary artery calcification. Enlarged pulmonic trunk, indicative of pulmonary arterial hypertension. Emphysema   TRANSTHORACIC ECHOCARDIOGRAM performed on 03/27/2023 Left ventricular ejection fraction, by estimation, is 55 to 60%. The left ventricle has normal function. The left ventricle has no regional wall motion abnormalities. Left ventricular diastolic function could not be evaluated.  Right ventricular systolic function is normal. The right ventricular size  is normal.  The mitral valve is normal in structure. Trivial mitral valve regurgitation. No evidence of mitral stenosis.  The aortic valve is normal in structure. Aortic valve regurgitation is not visualized. No aortic stenosis is present.  The inferior vena cava is normal in size with greater than 50% respiratory variability, suggesting right atrial pressure of 3 mmHg.   MR LUMBAR SPINE WO CONTRAST performed on 02/11/2022 At L4-5 there is a mild broad-based disc bulge. Severe bilateral facet arthropathy with ligamentum flavum infolding and small bilateral facet effusions. Severe spinal stenosis. Mild bilateral foraminal stenosis, left worse than right. At L5-S1 there is moderate bilateral facet arthropathy, right worse than left. Minimal left foraminal stenosis. No right foraminal stenosis. No acute osseous injury of the lumbar spine.  MYOCARDIAL PERFUSION IMAGING STUDY (LEXISCAN) performed on 02/15/2021 Mildly reduced left ventricular systolic function with an EF of 41% Global hypokinesis Normal left ventricular cavity size SPECT images demonstrate homogenous tracer distribution throughout the myocardium.   IMPRESSION AND PLAN: Cherylanne Combs has been referred for pre-anesthesia review and clearance prior to her undergoing the planned anesthetic and procedural courses. Available labs, pertinent testing, and imaging results were personally reviewed by me in preparation for upcoming operative/procedural course. [facility] medical record has been updated following extensive record review and patient interview with PAT staff.   This patient has been appropriately cleared by cardiology with an overall MODERATE risk of patient experiencing significant perioperative cardiovascular complications. Completed perioperative prescription for cardiac device management documentation completed by primary cardiology team and placed on patient's chart for review by the surgical/anesthetic team on the day of  her procedure. Electrophysiology indicating that procedure should not interfere with the planned surgical procedure. Beyond normal perioperative cardiovascular monitoring, there are no recommendations from her electrophysiology team that would prompt further discussions/recommendations from an industry representative.   Based on clinical review performed today (11/02/23), barring any significant acute changes in the patient's overall condition, it is anticipated that she will be able to proceed with the planned surgical intervention. Any acute changes in clinical condition may necessitate her procedure being postponed and/or cancelled. Patient will meet with anesthesia team (MD and/or CRNA) on the day of her procedure for preoperative evaluation/assessment. Questions regarding anesthetic course will be fielded at that time.   Pre-surgical instructions were reviewed with the patient during his PAT appointment, and questions were fielded to satisfaction by PAT clinical staff. She has been instructed on which medications that she will need to hold prior to surgery, as well as the ones that have been deemed safe/appropriate to take on the day of  her procedure. As part of the general education provided by PAT, patient made aware both verbally and in writing, that she would need to abstain from the use of any illegal substances during her perioperative course. She was advised that failure to follow the provided instructions could necessitate case cancellation or result in serious perioperative complications up to and including death. Patient encouraged to contact PAT and/or her surgeon's office to discuss any questions or concerns that may arise prior to surgery; verbalized understanding.   Dorise Pereyra, MSN, APRN, FNP-C, CEN [facility]  Perioperative Services Nurse Practitioner Phone: [phone] Fax: [phone] 11/02/23 1:55 PM  NOTE: This note has been prepared using Dragon  dictation software. Despite my best ability to proofread, there is always the potential that unintentional transcriptional errors may still occur from this process.

## 2023-11-06 ENCOUNTER — Ambulatory Visit

## 2023-11-06 ENCOUNTER — Ambulatory Visit: Payer: Self-pay | Admitting: Urgent Care

## 2023-11-06 ENCOUNTER — Encounter: Payer: Self-pay | Admitting: Pulmonary Disease

## 2023-11-06 ENCOUNTER — Ambulatory Visit
Admission: RE | Admit: 2023-11-06 | Discharge: 2023-11-06 | Disposition: A | Discharge status: Home / Self Care | Attending: Pulmonary Disease | Admitting: Pulmonary Disease

## 2023-11-06 ENCOUNTER — Encounter
Admission: RE | Disposition: A | Discharge status: Home / Self Care | Payer: Self-pay | Source: Home / Self Care | Attending: Pulmonary Disease

## 2023-11-06 ENCOUNTER — Other Ambulatory Visit: Payer: Self-pay

## 2023-11-06 DIAGNOSIS — Z79899 Other long term (current) drug therapy: Secondary | ICD-10-CM | POA: Insufficient documentation

## 2023-11-06 DIAGNOSIS — I10 Essential (primary) hypertension: Secondary | ICD-10-CM | POA: Insufficient documentation

## 2023-11-06 DIAGNOSIS — E669 Obesity, unspecified: Secondary | ICD-10-CM | POA: Diagnosis not present

## 2023-11-06 DIAGNOSIS — I442 Atrioventricular block, complete: Secondary | ICD-10-CM | POA: Insufficient documentation

## 2023-11-06 DIAGNOSIS — Z853 Personal history of malignant neoplasm of breast: Secondary | ICD-10-CM | POA: Insufficient documentation

## 2023-11-06 DIAGNOSIS — Z7982 Long term (current) use of aspirin: Secondary | ICD-10-CM | POA: Diagnosis not present

## 2023-11-06 DIAGNOSIS — I447 Left bundle-branch block, unspecified: Secondary | ICD-10-CM | POA: Diagnosis not present

## 2023-11-06 DIAGNOSIS — I2510 Atherosclerotic heart disease of native coronary artery without angina pectoris: Secondary | ICD-10-CM | POA: Insufficient documentation

## 2023-11-06 DIAGNOSIS — J449 Chronic obstructive pulmonary disease, unspecified: Secondary | ICD-10-CM | POA: Insufficient documentation

## 2023-11-06 DIAGNOSIS — Z6838 Body mass index (BMI) 38.0-38.9, adult: Secondary | ICD-10-CM | POA: Diagnosis not present

## 2023-11-06 DIAGNOSIS — K219 Gastro-esophageal reflux disease without esophagitis: Secondary | ICD-10-CM | POA: Diagnosis not present

## 2023-11-06 DIAGNOSIS — F17210 Nicotine dependence, cigarettes, uncomplicated: Secondary | ICD-10-CM | POA: Insufficient documentation

## 2023-11-06 DIAGNOSIS — Z950 Presence of cardiac pacemaker: Secondary | ICD-10-CM | POA: Insufficient documentation

## 2023-11-06 DIAGNOSIS — I428 Other cardiomyopathies: Secondary | ICD-10-CM | POA: Diagnosis not present

## 2023-11-06 DIAGNOSIS — R911 Solitary pulmonary nodule: Secondary | ICD-10-CM | POA: Insufficient documentation

## 2023-11-06 HISTORY — DX: Atherosclerosis of aorta: I70.0

## 2023-11-06 HISTORY — PX: ENDOBRONCHIAL ULTRASOUND: SHX5096

## 2023-11-06 HISTORY — DX: Spinal stenosis, lumbar region without neurogenic claudication: M48.061

## 2023-11-06 HISTORY — DX: Nicotine dependence, unspecified, uncomplicated: F17.200

## 2023-11-06 HISTORY — DX: Long term (current) use of aspirin: Z79.82

## 2023-11-06 HISTORY — DX: Other diseases of salivary glands: K11.8

## 2023-11-06 HISTORY — DX: Other forms of dyspnea: R06.09

## 2023-11-06 HISTORY — DX: Unspecified cataract: H26.9

## 2023-11-06 HISTORY — DX: Nontoxic single thyroid nodule: E04.1

## 2023-11-06 HISTORY — DX: Solitary pulmonary nodule: R91.1

## 2023-11-06 HISTORY — DX: Allergic rhinitis, unspecified: J30.9

## 2023-11-06 HISTORY — PX: VIDEO BRONCHOSCOPY WITH ENDOBRONCHIAL NAVIGATION: SHX6175

## 2023-11-06 HISTORY — DX: Atrioventricular block, complete: I44.2

## 2023-11-06 HISTORY — DX: Vitamin D deficiency, unspecified: E55.9

## 2023-11-06 HISTORY — DX: Polyp of colon: K63.5

## 2023-11-06 SURGERY — VIDEO BRONCHOSCOPY WITH ENDOBRONCHIAL NAVIGATION
Anesthesia: General | Laterality: Left

## 2023-11-06 MED ORDER — PHENYLEPHRINE 80 MCG/ML (10ML) SYRINGE FOR IV PUSH (FOR BLOOD PRESSURE SUPPORT)
PREFILLED_SYRINGE | INTRAVENOUS | Status: AC
  Filled 2023-11-06: qty 20

## 2023-11-06 MED ORDER — GLYCOPYRROLATE 0.2 MG/ML IJ SOLN
INTRAMUSCULAR | Status: DC | PRN
  Administered 2023-11-06: .2 mg via INTRAVENOUS

## 2023-11-06 MED ORDER — SEVOFLURANE IN SOLN
RESPIRATORY_TRACT | Status: AC
  Filled 2023-11-06: qty 250

## 2023-11-06 MED ORDER — FENTANYL CITRATE (PF) 100 MCG/2ML IJ SOLN
INTRAMUSCULAR | Status: DC | PRN
  Administered 2023-11-06: 50 ug via INTRAVENOUS

## 2023-11-06 MED ORDER — ROCURONIUM BROMIDE 10 MG/ML (PF) SYRINGE
PREFILLED_SYRINGE | INTRAVENOUS | Status: AC
Start: 2023-11-06 — End: 2023-11-06
  Filled 2023-11-06: qty 20

## 2023-11-06 MED ORDER — ROCURONIUM BROMIDE 100 MG/10ML IV SOLN
INTRAVENOUS | Status: DC | PRN
  Administered 2023-11-06: 50 mg via INTRAVENOUS

## 2023-11-06 MED ORDER — LIDOCAINE HCL (CARDIAC) PF 100 MG/5ML IV SOSY
PREFILLED_SYRINGE | INTRAVENOUS | Status: DC | PRN
  Administered 2023-11-06: 100 mg via INTRAVENOUS

## 2023-11-06 MED ORDER — ONDANSETRON HCL 4 MG/2ML IJ SOLN
INTRAMUSCULAR | Status: DC | PRN
  Administered 2023-11-06: 4 mg via INTRAVENOUS

## 2023-11-06 MED ORDER — PROPOFOL 1000 MG/100ML IV EMUL
INTRAVENOUS | Status: AC
  Filled 2023-11-06: qty 100

## 2023-11-06 MED ORDER — SUCCINYLCHOLINE CHLORIDE 200 MG/10ML IV SOSY
PREFILLED_SYRINGE | INTRAVENOUS | Status: AC
Start: 2023-11-06 — End: 2023-11-06
  Filled 2023-11-06: qty 20

## 2023-11-06 MED ORDER — CHLORHEXIDINE GLUCONATE 0.12 % MT SOLN
OROMUCOSAL | Status: AC
Start: 2023-11-06 — End: 2023-11-06
  Filled 2023-11-06: qty 15

## 2023-11-06 MED ORDER — OXYCODONE HCL 5 MG/5ML PO SOLN: 5.0000 mg | Freq: Once | ORAL | Status: DC | PRN

## 2023-11-06 MED ORDER — SUGAMMADEX SODIUM 200 MG/2ML IV SOLN
INTRAVENOUS | Status: DC | PRN
  Administered 2023-11-06: 200 mg via INTRAVENOUS

## 2023-11-06 MED ORDER — CHLORHEXIDINE GLUCONATE 0.12 % MT SOLN
15.0000 mL | Freq: Once | OROMUCOSAL | Status: AC
  Administered 2023-11-06: 15 mL via OROMUCOSAL

## 2023-11-06 MED ORDER — OXYCODONE HCL 5 MG PO TABS: 5.0000 mg | ORAL_TABLET | Freq: Once | ORAL | Status: DC | PRN

## 2023-11-06 MED ORDER — IPRATROPIUM-ALBUTEROL 0.5-2.5 (3) MG/3ML IN SOLN
RESPIRATORY_TRACT | Status: AC
  Filled 2023-11-06: qty 3

## 2023-11-06 MED ORDER — IPRATROPIUM-ALBUTEROL 0.5-2.5 (3) MG/3ML IN SOLN
3.0000 mL | Freq: Once | RESPIRATORY_TRACT | Status: AC
  Administered 2023-11-06: 3 mL via RESPIRATORY_TRACT

## 2023-11-06 MED ORDER — PROPOFOL 1000 MG/100ML IV EMUL
INTRAVENOUS | Status: AC
  Filled 2023-11-06: qty 200

## 2023-11-06 MED ORDER — LACTATED RINGERS IV SOLN: INTRAVENOUS | Status: DC

## 2023-11-06 MED ORDER — SODIUM CHLORIDE 0.9 % IV SOLN: Freq: Once | INTRAVENOUS | Status: DC

## 2023-11-06 MED ORDER — PROPOFOL 10 MG/ML IV BOLUS
INTRAVENOUS | Status: DC | PRN
  Administered 2023-11-06: 100 ug/kg/min via INTRAVENOUS

## 2023-11-06 MED ORDER — FENTANYL CITRATE (PF) 100 MCG/2ML IJ SOLN: 25.0000 ug | INTRAMUSCULAR | Status: DC | PRN

## 2023-11-06 MED ORDER — FENTANYL CITRATE (PF) 100 MCG/2ML IJ SOLN
INTRAMUSCULAR | Status: AC
  Filled 2023-11-06: qty 2

## 2023-11-06 MED ORDER — PROPOFOL 10 MG/ML IV BOLUS
INTRAVENOUS | Status: AC
  Filled 2023-11-06: qty 20

## 2023-11-06 MED ORDER — DEXAMETHASONE SODIUM PHOSPHATE 10 MG/ML IJ SOLN
INTRAMUSCULAR | Status: DC | PRN
  Administered 2023-11-06: 10 mg via INTRAVENOUS

## 2023-11-06 MED ORDER — ORAL CARE MOUTH RINSE: 15.0000 mL | Freq: Once | OROMUCOSAL | Status: AC

## 2023-11-06 NOTE — Anesthesia Postprocedure Evaluation (Signed)
 Anesthesia Post Note  [name]  Procedure(s) Performed: VIDEO BRONCHOSCOPY WITH ENDOBRONCHIAL NAVIGATION (Left) ENDOBRONCHIAL ULTRASOUND (EBUS) (Left)  Patient location during evaluation: PACU Anesthesia Type: General Level of consciousness: awake and alert Pain management: pain level controlled Vital Signs Assessment: post-procedure vital signs reviewed and stable Respiratory status: spontaneous breathing, nonlabored ventilation and respiratory function stable Cardiovascular status: blood pressure returned to baseline and stable Postop Assessment: no apparent nausea or vomiting Anesthetic complications: no   There were no known notable events for this encounter.   Last Vitals:  Vitals:   11/06/23 1145 11/06/23 1201  BP:  133/66  Pulse:  73  Resp:    Temp: (!) 36.1 C (!) 36.1 C  SpO2:  96%    Last Pain:  Vitals:   11/06/23 1201  TempSrc: Temporal  PainSc: 0-No pain                 Fairy POUR Brenda Combs

## 2023-11-06 NOTE — Op Note (Signed)
 Video Bronchoscopy with Robotic Assisted Bronchoscopic Navigation   Date of Operation: 11/06/2023   Pre-op Diagnosis: Left upper lobe lung nodule in a patient with history of estrogen receptor positive breast cancer rule out metastatic disease versus primary lung CA  Post-op Diagnosis: Same as above  Surgeon: [name], MD  Assistant: Greig Perry, RRT  Endo techs: Asberry Boehringer, Thom Gurney Tzintzun  [employer]ctor[employer]Indust[employer] Rep: Tawni Never, MD/Kyle Kortebein, Intuitive/Ion  Anesthesia: General endotracheal anesthesia  Operation: Flexible video fiberoptic bronchoscopy with robotic assistance and biopsies.  Estimated Blood Loss: Minimal  Complications: None  Indications and History: Brenda Combs is a 78 y.o. 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  Recommendation made to achieve a tissue diagnosis via robotic assisted navigational bronchoscopy.  The risks, benefits, complications, treatment options and expected outcomes were discussed with the patient.  The possibilities of pneumothorax, pneumonia, reaction to medication, pulmonary aspiration, perforation of a viscus, bleeding, failure to diagnose a condition and creating a complication requiring transfusion or operation were discussed with the patient who freely signed the consent.    Description of Procedure: The patient was seen in the Preoperative Area, was examined and was deemed appropriate to proceed.  The patient was taken to [facility] Procedure Room 2 (Bronchoscopy Suite), identified as Tijuana Scheidegger and the procedure verified as Robotic Assisted Video Bronchoscopy.  A Time Out was held and the above information confirmed.   Prior to the date of the procedure a high-resolution CT scan of the chest was performed. Utilizing ION software program a virtual tracheobronchial tree was generated to allow the creation  of distinct navigation pathways to the patient's parenchymal abnormalities. After being taken to the operating room general anesthesia was initiated and the patient  was orally intubated. The video fiberoptic bronchoscope was introduced via the endotracheal tube and a general inspection was performed which showed normal right and left lung anatomy. Aspiration of the bilateral mainstems was completed to remove any remaining secretions. Robotic catheter inserted into patient's endotracheal tube.   Target #1 LUL: The distinct navigation pathways prepared prior to this procedure were then utilized to navigate to patient's lesion identified on CT scan. The robotic catheter was secured into place and the vision probe was withdrawn.  Lesion location was approximated using fluoroscopy.  Local registration and targeting was performed using GE CBCT mobile C-arm three-dimensional imaging. REBUS confirmed target acquisition. Under fluoroscopic guidance transbronchial needle biopsy, transbronchial brushings, and transbronchial forceps biopsies were performed to be sent for cytology and pathology. Tool-in-lesion was confirmed using GE CBCT mobile C-arm.  ROSE confirmed adequate sampling.    After the robotic bronchoscopy portion of the procedure was completed the robotic bronchoscopic catheter was removed and an endobronchial ultrasound scope was introduced into the airway to examine mediastinal lymph node stations.  No significant adenopathy was noted.  At this point to EBUS scope was removed.  At the end of the procedure a general airway inspection was performed and there was no evidence of active bleeding. The bronchoscope was removed.  The patient tolerated the procedure well. There was no significant blood loss and there were no obvious complications. A post-procedural chest x-ray showed no pneumothorax.   Intraoperative images:  Fluoroscopic and target acquisition images:   Rebus image showing target  acquisition:    Samples Target #1: 1. Transbronchial  brushings: LUL x 1 2. Transbronchial needle biopsies: LUL x 4 3.  Transbronchial forceps biopsies: LUL x 6  Impression: Left upper lobe nodule status post technically successful robotic assisted navigational bronchoscopy No evidence of significant mediastinal adenopathy  Plan:  We will review the cytology and pathology results with the patient when they become available. Outpatient followup will be with pulmonary and oncology as scheduled.   [name], MD Advanced Bronchoscopy PCCM [facility]    *This note was generated using voice recognition software/Dragon and/or AI transcription program.  Despite best efforts to proofread, errors can occur which can change the meaning. Any transcriptional errors that result from this process are unintentional and may not be fully corrected at the time of dictation.

## 2023-11-06 NOTE — Discharge Instructions (Signed)
 Flexible Bronchoscopy, Care After This sheet gives you information about how to care for yourself after your test. Your doctor may also give you more specific instructions. If you have problems or questions, contact your doctor. Follow these instructions at home: Eating and drinking When you are wide awake, your numbness is gone and your cough and gag reflexes have come back, you may: Start eating only soft foods. Slowly drink liquids. Six hours after the test, go back to your normal diet. Driving Do not drive for 24 hours if you were given a medicine to help you relax (sedative). Do not drive or use heavy machinery while taking prescription pain medicine. General instructions Take over-the-counter and prescription medicines only as told by your doctor. Return to your normal activities as told. Ask what activities are safe for you. Do not use any products that have nicotine or tobacco in them. This includes cigarettes and e-cigarettes. If you need help quitting, ask your doctor. Keep all follow-up visits as told by your doctor. This is important. It is very important if you had a tissue sample (biopsy) taken. Get help right away if: You have shortness of breath that gets worse. You get light-headed. You feel like you are going to pass out (faint). You have chest pain. You cough up: More than a little blood. More blood than before. Summary Do not use cigarettes. Do not use e-cigarettes. Seek care in the Emergency Department right away if you have chest pain or shortness of breath. Call or MyChart Message our office for any questions or problems at [phone].    This information is not intended to replace advice given to you by your health care provider. Make sure you discuss any questions you have with your health care provider.

## 2023-11-06 NOTE — Interval H&P Note (Signed)
 Brenda Combs has presented today for surgery, with the diagnosis of LEFT UPPER LOBE NODULE.  The various methods of treatment have been discussed with the patient and family. After consideration of risks, benefits and other options for treatment, the patient has consented to  Procedure(s): ROBOTIC ASSISTED NAVIGATIONAL BRONCHOSCOPY AND ENDOBRONCHIAL ULTRASOUND-LEFT as a surgical intervention.  The patient's history has been reviewed, patient examined, no change in status, stable for surgery.  I have reviewed the patient's chart and labs.  Questions were answered to the patient's satisfaction.  Benefits, limitations and potential complications of the procedure were discussed with the patient/family.  Complications from bronchoscopy are rare and most often minor, but if they occur they may include breathing difficulty, vocal cord spasm, hoarseness, slight fever, vomiting, dizziness, bronchospasm, infection, low blood oxygen, bleeding from biopsy site, or an allergic reaction to medications.  It is uncommon for patients to experience other more serious complications for example: Collapsed lung requiring chest tube placement, respiratory failure, heart attack and/or cardiac arrhythmia.  Patient agrees to proceed.  [name], MD Advanced Bronchoscopy PCCM [facility]    *This note was generated using voice recognition software/Dragon and/or AI transcription program.  Despite best efforts to proofread, errors can occur which can change the meaning. Any transcriptional errors that result from this process are unintentional and may not be fully corrected at the time of dictation.

## 2023-11-06 NOTE — Anesthesia Procedure Notes (Signed)
 Procedure Name: Intubation Date/Time: 11/06/2023 10:00 AM  Performed by: Norleen Alberta HERO., CRNAPre-anesthesia Checklist: Patient identified, Patient being monitored, Timeout performed, Emergency Drugs available and Suction available Patient Re-evaluated:Patient Re-evaluated prior to induction Oxygen Delivery Method: Circle system utilized Preoxygenation: Pre-oxygenation with 100% oxygen Induction Type: IV induction Ventilation: Mask ventilation without difficulty Laryngoscope Size: McGrath and 4 Grade View: Grade I Tube type: Oral Tube size: 8.5 mm Number of attempts: 1 Airway Equipment and Method: Stylet Placement Confirmation: ETT inserted through vocal cords under direct vision, positive ETCO2 and breath sounds checked- equal and bilateral Secured at: 21 cm Tube secured with: Tape Dental Injury: Teeth and Oropharynx as per pre-operative assessment

## 2023-11-06 NOTE — Transfer of Care (Signed)
 Immediate Anesthesia Transfer of Care Note  [name]  Procedure(s) Performed: VIDEO BRONCHOSCOPY WITH ENDOBRONCHIAL NAVIGATION (Left) ENDOBRONCHIAL ULTRASOUND (EBUS) (Left)  Patient Location: PACU  Anesthesia Type:General  Level of Consciousness: awake, alert , oriented, and patient cooperative  Airway & Oxygen Therapy: Patient Spontanous Breathing and Patient connected to face mask oxygen  Post-op Assessment: Report given to RN and Post -op Vital signs reviewed and stable  Post vital signs: stable  Last Vitals:  Vitals Value Taken Time  BP 127/53 11/06/23 11:00  Temp 36 C 11/06/23 10:50  Pulse 72 11/06/23 11:08  Resp 16 11/06/23 11:08  SpO2 94 % 11/06/23 11:08  Vitals shown include unfiled device data.  Last Pain:  Vitals:   11/06/23 1050  TempSrc:   PainSc: 0-No pain         Complications: No notable events documented.

## 2023-11-06 NOTE — Anesthesia Preprocedure Evaluation (Signed)
 Anesthesia Evaluation  Patient identified by MRN, date of birth, ID band Patient awake    Reviewed: Allergy & Precautions, NPO status , Patient's Chart, lab work & pertinent test results  Airway Mallampati: III  TM Distance: <3 FB Neck ROM: full    Dental  (+) Chipped, Poor Dentition, Missing   Pulmonary COPD, Current Smoker and Patient abstained from smoking.   + rhonchi  + decreased breath sounds      Cardiovascular Exercise Tolerance: Good hypertension, + CAD  Normal cardiovascular exam+ dysrhythmias + pacemaker + Valvular Problems/Murmurs      Neuro/Psych  PSYCHIATRIC DISORDERS      negative neurological ROS     GI/Hepatic Neg liver ROS,GERD  Controlled,,  Endo/Other  negative endocrine ROS    Renal/GU      Musculoskeletal   Abdominal   Peds  Hematology negative hematology ROS (+)   Anesthesia Other Findings Past Medical History: No date: Allergic rhinitis No date: Anemia No date: Aortic atherosclerosis No date: Arthritis No date: Ascending aorta dilatation No date: Bilateral carotid artery stenosis No date: Bilateral cataracts 06/10/2021: Breast cancer, left (HCC)     Comment:  a.) stage unknown (pT63mi, pNX, cM0, G1, ER+, PR+,               HER2-); s/p partial mastectomy + adjuvant XRT; unable to               tolerate adjuvant endocrine therapy No date: CAD (coronary artery disease) No date: Colon polyps No date: Complete heart block (HCC)     Comment:  a.) s/p leadless PPM placement 03/27/2023 No date: Dyspnea on exertion No date: GERD (gastroesophageal reflux disease) No date: Gout No date: Heart murmur No date: History of abnormal Pap smear No date: Hyperlipidemia No date: Hypertension 01/20/2021: LBBB (left bundle branch block)     Comment:  a.) noted on preoperative ECG No date: Left thyroid  nodule No date: Left upper lobe pulmonary nodule No date: Long-term use of aspirin  therapy No  date: Lumbar spinal stenosis 02/15/2021: NICM (nonischemic cardiomyopathy) (HCC)     Comment:  a.) TTE 02/15/2021: ED 35-40%; mod LV systolic               dysfunction with LVH; mild panvalvular regurgitation. b.)              Lexiscan 02/15/2021: EF 21%; global LV systolic               dysfunction; no significant ischemia. No date: Nicotine dependence No date: Nodule of parotid gland (BILATERAL) 03/27/2023: Presence of leadless cardiac pacemaker     Comment:  a.) MDT Micra-AV2 (SN: [id number]) No date: Vitamin D deficiency  Past Surgical History: No date: APPENDECTOMY 01/07/2021: BREAST BIOPSY; Left     Comment:  stereo bx calcs, coil marker, PAPILLARY LESION WITH               ATYPIA- CALCIFICATIONS ARE PRESENT. 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, Microinvasive carcinoma 11/23/2015: COLONOSCOPY WITH PROPOFOL ; N/A     Comment:  Procedure: COLONOSCOPY WITH PROPOFOL ;  Surgeon: Lamar ONEIDA Holmes, MD;  Location: [facility] ENDOSCOPY;  Service:               Endoscopy;  Laterality: N/A; 02/07/2017: COLONOSCOPY WITH PROPOFOL ; N/A     Comment:  Procedure: COLONOSCOPY WITH PROPOFOL ;  Surgeon:               Gaylyn Gladis PENNER, MD;  Location: [facility] ENDOSCOPY;                Service: Endoscopy;  Laterality: N/A; 11/23/2015: ESOPHAGOGASTRODUODENOSCOPY (EGD) WITH PROPOFOL ; N/A     Comment:  Procedure:  ESOPHAGOGASTRODUODENOSCOPY (EGD) WITH               PROPOFOL ;  Surgeon: Lamar ONEIDA Holmes, MD;  Location: [facility]              ENDOSCOPY;  Service: Endoscopy;  Laterality: N/A; 03/27/2023: PACEMAKER IMPLANT; N/A     Comment:  Procedure: PACEMAKER IMPLANT;  Surgeon: Ammon Blunt, MD;  Location: [facility] INVASIVE CV LAB;  Service:              Cardiovascular;  Laterality: N/A;  DDD pacer 03/27/2023: TEMPORARY PACEMAKER; N/A     Comment:  Procedure: TEMPORARY PACEMAKER;  Surgeon: Ammon Blunt, MD;  Location: [facility] INVASIVE CV LAB;  Service:              Cardiovascular;  Laterality: N/A; No date: TUBAL LIGATION No date: WISDOM TOOTH EXTRACTION     Comment:  x 2     Reproductive/Obstetrics negative OB ROS                              Anesthesia Physical Anesthesia Plan  ASA: 4  Anesthesia Plan: General ETT   Post-op Pain Management:    Induction: Intravenous  PONV Risk Score and Plan: Ondansetron , Dexamethasone , Midazolam  and Treatment may vary due to age or medical condition  Airway Management Planned: Oral ETT  Additional Equipment:   Intra-op Plan:   Post-operative Plan: Extubation in OR  Informed Consent: I have reviewed the patients History and Physical, chart, labs and discussed the procedure including the risks, benefits and alternatives for the proposed anesthesia with the patient or authorized representative who has indicated his/her understanding and acceptance.     Dental Advisory Given  Plan Discussed with: Anesthesiologist, CRNA and Surgeon  Anesthesia Plan Comments: (Patient consented for risks of anesthesia including but not limited to:  - adverse reactions to medications - damage to eyes, teeth, lips or other oral mucosa - nerve damage due to positioning  - sore throat or hoarseness - Damage to heart, brain, nerves, lungs, other parts of body or loss of life  Patient voiced understanding and  assent.)        Anesthesia  Quick Evaluation

## 2023-11-06 NOTE — Procedures (Signed)
 Date of Procedure: 11/06/2023   Pre-op Diagnosis: Left upper lobe nodule, rule out metastatic carcinoma versus primary lung carcinoma  Post-op Diagnosis: Same as above   Surgeon: [name], MD  Assistants: Greig Perry, RRT  Endo techs: Asberry Boehringer, Thom Gurney Tzintzun  Fluoroscopy technician: Lavanda Fischer, RT   Anesthesia: General endotracheal anesthesia, see anesthesia record   Operation: Bronchoscopy with robotic assistance and biopsies  Estimated Blood Loss: Minimal   Complications: None  Procedure Note  Clinical indication Evaluation of a newly detected peripheral pulmonary nodule in the left upper lobe, identified on a recent follow-up FDG-PET/CT. Nodule 11 mm in diameter in a patient with carcinoma of the left breast estrogen receptor positive.  Technique Intraoperative Cone Beam Computed Tomography (CBCT) images were acquired using a GE [employer] OEC 3D system, optimized for thoracic imaging. A high-quality 30-second acquisition protocol was used. The patient was positioned supine on the examination table. Pre-biopsy CBCT scans were performed to confirm the lesion's visibility and to determine the optimal approach. Navigation was performed using an [employer] (Intuitive). Repeat CBCT scans were performed to confirm tool-in-lesion placement and to assess for complications.   Findings Left Upper Lobe Peripheral Pulmonary Nodule: A solid nodule, measuring approximately 11 mm in diameter, is identified in the left upper lobe. The nodule exhibits a subsolid appearance. Bronchial Anatomy: Patent airways are visualized up to the segmental bronchi leading towards the target nodule. Navigation: The Ion bronchoscopic navigation successfully acquired the target and guided biopsy tools towards the left upper lobe nodule. Tool-in-Lesion Confirmation: Intraoperative CBCT confirmed the accurate positioning of the biopsy forceps within the  nodule. Complications: No immediate complications such as pneumothorax or hemorrhage were observed during or immediately following the procedure.   Fluoroscopy time/total dose 5 minutes 37 seconds, 244.8 mGy  Impression CBCT-guided navigation bronchoscopy with biopsy was performed for the peripheral pulmonary nodule in the left upper lobe. The procedure was technically successful with confirmed tool-in-lesion placement. Samples were obtained for histological and cytological examination.   Recommendations Submit biopsy samples for histopathological and cytological analysis. Images were uploaded to PACS. Schedule follow-up appointment with the referring physician to discuss the results and treatment plan. Monitor for delayed complications such as pneumothorax or hemorrhage.   Please see associated operative report for robotic bronchoscopy description.  [name], MD Advanced Bronchoscopy PCCM [facility]    *This note was generated using voice recognition software/Dragon and/or AI transcription program.  Despite best efforts to proofread, errors can occur which can change the meaning. Any transcriptional errors that result from this process are unintentional and may not be fully corrected at the time of dictation.

## 2023-11-07 ENCOUNTER — Encounter: Payer: Self-pay | Admitting: Pulmonary Disease

## 2023-11-08 ENCOUNTER — Other Ambulatory Visit: Payer: Self-pay

## 2023-11-08 ENCOUNTER — Ambulatory Visit: Payer: Self-pay | Admitting: Pulmonary Disease

## 2023-11-08 DIAGNOSIS — Z122 Encounter for screening for malignant neoplasm of respiratory organs: Secondary | ICD-10-CM

## 2023-11-08 DIAGNOSIS — Z87891 Personal history of nicotine dependence: Secondary | ICD-10-CM

## 2023-11-08 DIAGNOSIS — R911 Solitary pulmonary nodule: Secondary | ICD-10-CM

## 2023-11-08 LAB — CYTOLOGY - NON PAP

## 2023-11-08 LAB — SURGICAL PATHOLOGY

## 2023-11-13 ENCOUNTER — Inpatient Hospital Stay: Admitting: Oncology

## 2023-11-13 ENCOUNTER — Encounter: Payer: Self-pay | Admitting: Oncology

## 2023-11-13 ENCOUNTER — Telehealth: Payer: Self-pay

## 2023-11-13 DIAGNOSIS — R911 Solitary pulmonary nodule: Secondary | ICD-10-CM

## 2023-11-13 NOTE — Telephone Encounter (Signed)
-----   Message from Nurse [facility] R sent at 11/10/2023  8:39 AM EDT ----- Regarding: Bronch results Good morning! Pt had her bronch this past week and is scheduled to follow up with you on Monday 10/13 for results. Her results were negative for malignancy and Dr. Tamea recommended a CT scan in 3 months. Do you still want to see pt on Monday 10/13?   [facility]

## 2023-11-13 NOTE — Assessment & Plan Note (Deleted)
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 Additional sentinel lymph node biopsy was omitted due to patient's age. Not able to tolerate Arimidex  and Aromasin .  She is not interested in switching to other AI's or switching to tamoxifen.   Annual bilateral diagnostic mammogram in Nov 2025-

## 2023-11-13 NOTE — Telephone Encounter (Signed)
 Per Dr. Babara, no need to see pt today. She recommends CT chest wo in 3 months and MD 1 week after CT. Hayley has contacted pt and gave her updated follow up plan.   Please schedule and notify pt of appts:   3 months: CT chest wo  MD 1 week after CT

## 2023-12-04 ENCOUNTER — Telehealth: Payer: Self-pay

## 2023-12-04 NOTE — Telephone Encounter (Signed)
 Copied from CRM [id number]. Topic: General - Other >> Dec 04, 2023 10:22 AM Lavanda D wrote: Reason for CRM: Patient has a follow-up scheduled for 11/10 for a bronchoscopy follow-up and would like to know if she should keep this appt for now or reschedule it for after her follow-up scan.

## 2023-12-04 NOTE — Telephone Encounter (Signed)
 Spoke with pt and advised for her to keep her upcoming appointment with Dr. Tamea and she will discuss next steps such as what to do or expect after the follow up CT in January. Pt was agreeable. NFN.

## 2023-12-11 ENCOUNTER — Encounter: Payer: Self-pay | Admitting: Pulmonary Disease

## 2023-12-11 ENCOUNTER — Ambulatory Visit: Admitting: Pulmonary Disease

## 2023-12-11 VITALS — BP 114/80 | HR 71 | Temp 97.9°F | Ht 60.0 in | Wt 199.6 lb

## 2023-12-11 DIAGNOSIS — C50212 Malignant neoplasm of upper-inner quadrant of left female breast: Secondary | ICD-10-CM | POA: Diagnosis not present

## 2023-12-11 DIAGNOSIS — Z170 Estrogen receptor positive status [ER+]: Secondary | ICD-10-CM

## 2023-12-11 DIAGNOSIS — F17210 Nicotine dependence, cigarettes, uncomplicated: Secondary | ICD-10-CM | POA: Diagnosis not present

## 2023-12-11 DIAGNOSIS — R911 Solitary pulmonary nodule: Secondary | ICD-10-CM | POA: Diagnosis not present

## 2023-12-11 DIAGNOSIS — Z87891 Personal history of nicotine dependence: Secondary | ICD-10-CM

## 2023-12-11 NOTE — Progress Notes (Signed)
 Subjective:    Patient ID: Brenda Combs, female    DOB: [birth year], 78 y.o.   MRN: [id number]  Patient Care Team: [name], PA as PCP - General (Physician Assistant) [name], MD as Consulting Physician (Hematology and Oncology) [name], MD as Consulting Physician (Radiation Oncology) Rodolph Romano, MD as Consulting Physician (General Surgery)  Chief Complaint  Patient presents with   Lung Mass    BACKGROUND/INTERVAL:Patient is a 78 year old current smoker with a history as noted below who presents for evaluation of a left lung nodule noted on lung cancer screening CT. She underwent robotic assisted navigational bronchoscopy on 06 November 2023.  HPI Discussed the use of AI scribe software for clinical note transcription with the patient, who gave verbal consent to proceed.  History of Present Illness   Brenda Combs is a 78 year old female who presents for follow-up on a lung nodule.  A lung nodule was previously identified, and a follow-up scan is scheduled for February 06, 2024, to monitor its status. No breathing difficulties or problems have occurred following the procedure related to the nodule.  Nodule is 11 mm in diameter located in the left upper lobe and with mixed attenuation on PET CT.  Biopsy results showed no malignancy.  She continues to smoke approximately four cigarettes per day.   She is up-to-date on flu vaccine.     Review of Systems A 10 point review of systems was performed and it is as noted above otherwise negative.   Patient Active Problem List   Diagnosis Date Noted   Lung nodule seen on imaging study 10/24/2023   Symptomatic sinus bradycardia 03/26/2023   Complete heart block (HCC) 03/26/2023   Hypocalcemia 06/15/2022   Nicotine dependence 03/09/2021   Gastritis 03/09/2021   Malignant neoplasm of upper-inner quadrant of left breast in female, estrogen receptor positive (HCC) 03/09/2021   Goals of care,  counseling/discussion 03/09/2021   Cardiomyopathy, nonischemic (HCC) 02/23/2021   Coronary artery disease involving native coronary artery of native heart 01/26/2021   Bilateral carotid artery stenosis 01/26/2021   Osteopenia 04/13/2015   Hypokalemia 04/13/2015   Allergic rhinitis 04/13/2015   Thyroid  nodule 04/13/2015   Vitamin D deficiency 04/13/2015   GERD (gastroesophageal reflux disease) 04/13/2015   Plantar fasciitis 04/13/2015   Gout 11/20/2013   Hypertension 06/29/2011   Hypercholesteremia 06/29/2011   Obesity 06/29/2011   Former smoker 06/29/2011   Heart murmur    Arthritis     Social History   Tobacco Use   Smoking status: Every Day    Current packs/day: 0.25    Average packs/day: 0.7 packs/day for 41.9 years (30.5 ttl pk-yrs)    Types: Cigarettes    Start date: 02/28/1981    Last attempt to quit: 02/28/2021   Smokeless tobacco: Never  Substance Use Topics   Alcohol use: No    Allergies  Allergen Reactions   Jardiance [Empagliflozin]     Abdominal pain   Augmentin [Amoxicillin-Pot Clavulanate] Diarrhea   Zyrtec [Cetirizine Hcl]     depression    Current Meds  Medication Sig   allopurinol  (ZYLOPRIM ) 100 MG tablet Take 100 mg by mouth daily.   amLODipine (NORVASC) 5 MG tablet Take 7.5 mg by mouth.   aspirin  81 MG tablet Take 81 mg by mouth daily.   atorvastatin  (LIPITOR) 40 MG tablet Take 40 mg by mouth daily.   Calcium  Carbonate-Vit D-Min (CALCIUM  1200 PO) Take by mouth. (Patient taking differently: Take by mouth. 1200 mg in the  morning and 600 mg in the evening.)   Cyanocobalamin (VITAMIN B-12) 500 MCG SUBL Place 1 tablet under the tongue daily at 6 (six) AM.   cyclobenzaprine (FLEXERIL) 5 MG tablet Take 5 mg by mouth 2 (two) times daily as needed for muscle spasms.   ENTRESTO  49-51 MG Take 1 tablet by mouth 2 (two) times daily.   fluticasone (FLONASE) 50 MCG/ACT nasal spray Place 1 spray into both nostrils daily.   furosemide (LASIX) 20 MG tablet Take 20  mg by mouth every other day.   labetalol  (NORMODYNE ) 200 MG tablet Take 200 mg by mouth 2 (two) times daily.   magnesium  oxide (MAG-OX) 400 (240 Mg) MG tablet Take 400 mg by mouth daily.   omeprazole (PRILOSEC) 40 MG capsule Take 40 mg by mouth daily.   spironolactone  (ALDACTONE ) 25 MG tablet Take 25 mg by mouth daily.    Immunization History  Administered Date(s) Administered   PFIZER Comirnaty(Gray Top)Covid-19 Tri-Sucrose Vaccine 05/01/2020   PNEUMOCOCCAL CONJUGATE-20 03/28/2023        Objective:     BP 114/80   Pulse 71   Temp 97.9 F (36.6 C) (Temporal)   Ht 5' (1.524 m)   Wt 199 lb 9.6 oz (90.5 kg)   SpO2 99%   BMI 38.98 kg/m   SpO2: 99 %  GENERAL: Obese, well-developed woman, no acute distress.  No conversational dyspnea.  Fully ambulatory. HEAD: Normocephalic, atraumatic.  EYES: Pupils equal, round, reactive to light.  No scleral icterus.  MOUTH: Dentition intact, oral mucosa moist.  No thrush. NECK: Supple. No thyromegaly. Trachea midline. No JVD.  No adenopathy. PULMONARY: Good air entry bilaterally.  No adventitious sounds. CARDIOVASCULAR: S1 and S2. Regular rate and rhythm.  No rubs, murmurs or gallops heard. ABDOMEN: Obese, otherwise benign. MUSCULOSKELETAL: No joint deformity, no clubbing, no edema.  NEUROLOGIC: No overt focal deficit, no gait disturbance, speech is fluent. SKIN: Intact,warm,dry. PSYCH: Mood and behavior normal.   Assessment & Plan:     ICD-10-CM   1. Lung nodule  R91.1     2. Malignant neoplasm of upper-inner quadrant of left breast in female, estrogen receptor positive (HCC)  C50.212    Z17.0     3. Personal history of tobacco use, presenting hazards to health  Z87.891      Discussion:    Pulmonary nodule under surveillance The pulmonary nodule is very small and located high in the lung. Previous procedure was accurate, but due to its size and location, slight deviations could affect outcomes. Differential diagnosis includes  inflammation. - Scheduled follow-up scan on January 6th to monitor the nodule. - Will schedule follow-up appointment a few days after the scan to review results and determine further action if necessary.  Tobacco use disorder Continues to smoke at least four cigarettes per day. Smoking cessation is crucial for lung health and overall well-being. - Encouraged smoking cessation efforts.     Smoking/Tobacco Cessation Counseling Brenda Combs is a current user of tobacco or nicotine products. She is considering quitting at this time. Counseling provided today addressed the risks of continued use and the benefits of cessation. Discussed tobacco/nicotine use history, readiness to quit, and evidence-based treatment options including behavioral strategies, support resources, and pharmacologic therapies. Provided encouragement and educational materials on steps and resources to quit smoking. Patient questions were addressed, and follow-up recommended for continued support. Total time spent on counseling: 5 minutes.    Advised if symptoms do not improve or worsen, to please contact office for sooner  follow up or seek emergency care.    I spent 30 minutes of dedicated to the care of this patient on the date of this encounter to include pre-visit review of records, face-to-face time with the patient discussing conditions above, post visit ordering of testing, clinical documentation with the electronic health record, making appropriate referrals as documented, and communicating necessary findings to members of the patients care team.     C. Leita Sanders, MD Advanced Bronch[facility]    *This note was generated using voice recognition software/Dragon and/or AI transcription program.  Despite best efforts to proofread, errors can occur which can change the meaning. Any transcriptional errors that result from this process are unintentional and may not be fully corrected at  the time of dictation.

## 2023-12-11 NOTE — Patient Instructions (Signed)
 VISIT SUMMARY:  Today, you came in for a follow-up on your lung nodule. We discussed the current status of the nodule and your smoking habits.  YOUR PLAN:  -PULMONARY NODULE UNDER SURVEILLANCE: A pulmonary nodule is a small, round growth in the lung. Your nodule is very small and located high in the lung. We have scheduled a follow-up scan on January 6th to monitor its status. After the scan, we will have a follow-up appointment to review the results and decide on any further action if needed.  -TOBACCO USE DISORDER: Tobacco use disorder means you are dependent on smoking. You continue to smoke about four cigarettes per day. Quitting smoking is very important for your lung health and overall well-being. We encourage you to make efforts to stop smoking.  INSTRUCTIONS:  Please come in for your follow-up scan on January 6th. We will schedule a follow-up appointment a few days after the scan to review the results and discuss any further steps.

## 2023-12-13 ENCOUNTER — Ambulatory Visit
Admission: RE | Admit: 2023-12-13 | Discharge: 2023-12-13 | Disposition: A | Discharge status: Home / Self Care | Source: Ambulatory Visit | Attending: Oncology | Admitting: Oncology

## 2023-12-13 DIAGNOSIS — Z170 Estrogen receptor positive status [ER+]: Secondary | ICD-10-CM | POA: Diagnosis present

## 2023-12-13 DIAGNOSIS — C50212 Malignant neoplasm of upper-inner quadrant of left female breast: Secondary | ICD-10-CM | POA: Diagnosis present

## 2024-01-03 ENCOUNTER — Ambulatory Visit: Admitting: Oncology

## 2024-01-03 ENCOUNTER — Other Ambulatory Visit

## 2024-01-24 ENCOUNTER — Encounter: Payer: Self-pay | Admitting: Oncology

## 2024-02-06 ENCOUNTER — Ambulatory Visit
Admission: RE | Admit: 2024-02-06 | Discharge: 2024-02-06 | Disposition: A | Discharge status: Home / Self Care | Source: Ambulatory Visit | Attending: Oncology | Admitting: Oncology

## 2024-02-06 DIAGNOSIS — J439 Emphysema, unspecified: Secondary | ICD-10-CM | POA: Diagnosis not present

## 2024-02-06 DIAGNOSIS — I517 Cardiomegaly: Secondary | ICD-10-CM | POA: Diagnosis not present

## 2024-02-06 DIAGNOSIS — R911 Solitary pulmonary nodule: Secondary | ICD-10-CM | POA: Diagnosis present

## 2024-02-06 DIAGNOSIS — I700 Atherosclerosis of aorta: Secondary | ICD-10-CM | POA: Diagnosis not present

## 2024-02-06 DIAGNOSIS — E042 Nontoxic multinodular goiter: Secondary | ICD-10-CM | POA: Insufficient documentation

## 2024-02-13 ENCOUNTER — Ambulatory Visit: Admitting: Pulmonary Disease

## 2024-02-14 ENCOUNTER — Ambulatory Visit

## 2024-02-20 ENCOUNTER — Inpatient Hospital Stay: Attending: Oncology | Admitting: Oncology

## 2024-02-20 ENCOUNTER — Other Ambulatory Visit

## 2024-02-20 ENCOUNTER — Encounter: Payer: Self-pay | Admitting: Oncology

## 2024-02-20 VITALS — BP 127/71 | HR 103 | Temp 96.8°F | Resp 18 | Wt 199.3 lb

## 2024-02-20 DIAGNOSIS — C50212 Malignant neoplasm of upper-inner quadrant of left female breast: Secondary | ICD-10-CM | POA: Diagnosis not present

## 2024-02-20 DIAGNOSIS — R911 Solitary pulmonary nodule: Secondary | ICD-10-CM

## 2024-02-20 DIAGNOSIS — M8580 Other specified disorders of bone density and structure, unspecified site: Secondary | ICD-10-CM | POA: Diagnosis not present

## 2024-02-20 DIAGNOSIS — Z170 Estrogen receptor positive status [ER+]: Secondary | ICD-10-CM | POA: Diagnosis not present

## 2024-02-20 NOTE — Assessment & Plan Note (Addendum)
 Recommend calcium  and vitamin D supplementation.  Repeat DEXA in 2026

## 2024-02-20 NOTE — Assessment & Plan Note (Addendum)
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 Additional sentinel lymph node biopsy was omitted due to patient's age. Not able to tolerate Arimidex  and Aromasin .  She is not interested in switching to other AI's or switching to tamoxifen.   Annual bilateral diagnostic mammogram  Nov 2025 results were reviewed.

## 2024-02-20 NOTE — Progress Notes (Signed)
 " Hematology/Oncology Progress note [phone] [phone]            Patient Care Team: [name], PA as PCP - General (Physician Assistant) [name], MD as Consulting Physician (Hematology and Oncology) [name], MD as Consulting Physician (Radiation Oncology) Rodolph Romano, MD as Consulting Physician (General Surgery)  ASSESSMENT & PLAN:   Cancer Staging  Malignant neoplasm of upper-inner quadrant of left breast in female, estrogen receptor positive (HCC) Staging form: Breast, AJCC 8th Edition - Pathologic: Stage Unknown (pT75mi, pNX, cM0, G1, ER+, PR+, HER2-) - Signed by [name], MD on 06/10/2021   Malignant neoplasm of upper-inner quadrant of left breast in female, estrogen receptor positive (HCC) #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 Additional sentinel lymph node biopsy was omitted due to patient's age. Not able to tolerate Arimidex  and Aromasin .  She is not interested in switching to other AI's or switching to tamoxifen.   Annual bilateral diagnostic mammogram  Nov 2025 results were reviewed.  Osteopenia Recommend calcium  and vitamin D supplementation.  Repeat DEXA in 2026  Lung nodule seen on imaging study Left lung nodule with low metabolic activity on PET scan. Status post bronchoscopy biopsy, negative for malignancy. Recent CT showed stable lung nodule. Recommend continue monitor and repeat CT in 6 months.  Orders Placed This Encounter  Procedures   CT Chest Wo Contrast    Standing Status:   Future    Expected Date:   08/19/2024    Expiration Date:   02/19/2025    Preferred imaging location?:   [facility]   DG Bone Density    Standing Status:   Future    Expected Date:   08/19/2024    Expiration Date:   02/19/2025    Reason for Exam (SYMPTOM  OR DIAGNOSIS REQUIRED):   Breasf cancer    Preferred  imaging location?:   [facility]   Follow up  6 months  lab MD cbc cmp  All questions were answered. The patient knows to call the clinic with any problems, questions or concerns.  Call Babara, MD, PhD [facility] Hematology Oncology 02/20/2024   CHIEF COMPLAINTS/REASON FOR VISIT:  left breast cancer, left lung nodule  HISTORY OF PRESENTING ILLNESS:   Brenda Combs is a  79 y.o.  female presents for follow up of left breast cancer Oncology History  Malignant neoplasm of upper-inner quadrant of left breast in female, estrogen receptor positive (HCC)  12/18/2020 Mammogram   12/18/2020, screening mammogram bilaterally showed possible mass warrants further evaluation.  Right breast has no findings suspicious for malignancy. 12/28/2020, left breast diagnostic mammogram showed 11:00 1.4 cm suspicious mass.  Associated calcifications in linear distribution spanning 4 cm.  There is no evidence of left axillary lymphadenopathy.   01/07/2021 Initial Diagnosis   Malignant neoplasm of upper-inner quadrant of left breast in female, estrogen receptor positive   -Ultrasound-guided left breast 11:00 mass biopsy   Pathology showed detached fragment of papillary lesion with sclerosis and atypia. and stereotactic biopsy of left upper inner quadrant calcification.  The pathology showed papillary lesion with atypia.  Calcifications are present.  No definitive evidence of invasive carcinoma in these biopsies.  -03/01/2021, patient underwent left breast lumpectomy by Dr. Cesar. 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 Margins are negative for both invasive carcinoma and DCIS.  pT39mi pNx ER+ [ER  expression not visualizing the invasive component due to small size and suboptimal tissue sections.]-PR + HER2 negative     03/09/2021 Cancer Staging   Staging form:  Breast, AJCC 8th Edition - Pathologic: Stage Unknown (pT75mi, pNX, cM0, G1, ER+, PR+, HER2-) - Signed by [name], MD on 06/10/2021 Stage prefix: Initial diagnosis Multigene prognostic tests performed: None Histologic grading system: 3 grade system   04/13/2021 - 05/20/2021 Radiation Therapy   adjuvant radiation to the left breast.   07/14/2021 Imaging   DEXA -This patient is considered osteopenic  FRAX* RESULTS  10-year Probability of Fracture1 Major Osteoporotic Fracture2 Hip Fracture 5.7% 1.5%   07/14/2021 - 07/28/2021 Anti-estrogen oral therapy   Arimidex  1mg  daily.self stopped due to arthralgia   09/10/2021 -  Anti-estrogen oral therapy   Aromasin  25mg  daily.  Patient tried and stopped shortly after starting.     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  INTERVAL HISTORY Brenda Combs is a 79 y.o. female who has above history reviewed by me today presents for follow up visit for management of left breast DCIS with microinvasive carcinoma. She can not tolerate Aromasin  and has stopped.  Chronic arthralgia, no new breast concerns  10/31/2023, PET scan showed mildly hypermetabolic left apical mixed attenuation pulmonary nodule, 11 mm, SUV 2.8.  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  11/06/2023, status post bronchoscopy left upper lobe lung nodule biopsy. Pathology showed scant benign bronchial epithelium.  No evidence of malignancy in the submitted material.  02/15/2024, CT chest without contrast showed 1.1 x 1.0 cm left apical nodule, similar to prior.  Enlargement of pulmonary outflow tract/main pulmonary arteries suggesting pulmonary artery hypertension.  Multinodular thyroid  enlargement.  Aortic atherosclerosis.   Review of Systems   Constitutional:  Negative for appetite change, chills, fatigue and fever.  HENT:   Negative for hearing loss and voice change.   Eyes:  Negative for eye problems.  Respiratory:  Negative for chest tightness and cough.   Cardiovascular:  Negative for chest pain.  Gastrointestinal:  Negative for abdominal distention, abdominal pain and blood in stool.  Endocrine: Negative for hot flashes.  Genitourinary:  Negative for difficulty urinating and frequency.   Musculoskeletal:  Positive for arthralgias.  Skin:  Negative for itching and rash.  Neurological:  Negative for extremity weakness.  Hematological:  Negative for adenopathy.  Psychiatric/Behavioral:  Negative for confusion.     MEDICAL HISTORY:  Past Medical History:  Diagnosis Date   Allergic rhinitis    Anemia    Aortic atherosclerosis    Arthritis    Ascending aorta dilatation    Bilateral carotid artery stenosis    Bilateral cataracts    Breast cancer, left (HCC) 06/10/2021   a.) stage unknown (pT68mi, pNX, cM0, G1, ER+, PR+, HER2-); s/p partial mastectomy + adjuvant XRT; unable to tolerate adjuvant endocrine therapy   CAD (coronary artery disease)    Colon polyps    Complete heart block (HCC)    a.) s/p leadless PPM placement 03/27/2023   Dyspnea on exertion    GERD (gastroesophageal reflux disease)    Gout    Heart murmur    History of abnormal Pap smear    Hyperlipidemia    Hypertension    LBBB (left bundle branch block) 01/20/2021   a.) noted on preoperative ECG   Left thyroid  nodule  Left upper lobe pulmonary nodule    Long-term use of aspirin  therapy    Lumbar spinal stenosis    NICM (nonischemic cardiomyopathy) (HCC) 02/15/2021   a.) TTE 02/15/2021: ED 35-40%; mod LV systolic dysfunction with LVH; mild panvalvular regurgitation. b.) Lexiscan 02/15/2021: EF 21%; global LV systolic dysfunction; no significant ischemia.   Nicotine dependence    Nodule of parotid gland (BILATERAL)    Presence of leadless  cardiac pacemaker 03/27/2023   a.) MDT Micra-AV2 (SN: [id number])   Vitamin D deficiency     SURGICAL HISTORY: Past Surgical History:  Procedure Laterality Date   APPENDECTOMY     BREAST BIOPSY Left 01/07/2021   stereo bx calcs, coil marker, PAPILLARY LESION WITH ATYPIA- CALCIFICATIONS ARE PRESENT. 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, Microinvasive carcinoma   COLONOSCOPY WITH PROPOFOL  N/A 11/23/2015   Procedure: COLONOSCOPY WITH PROPOFOL ;  Surgeon: Lamar ONEIDA Holmes, MD;  Location[facility] ENDOSCOPY;  Service: Endoscopy;  Laterality: N/A;   COLONOSCOPY WITH PROPOFOL  N/A 02/07/2017   Procedure: COLONOSCOPY WITH PROPOFOL ;  Surgeon: Gaylyn Gladis PENNER, MD;  Location: [facility] ENDOSCOPY;  Service: Endoscopy;  Laterality: N/A;   ENDOBRONCHIAL ULTRASOUND Left 11/06/2023   Procedure: ENDOBRONCHIAL ULTRASOUND (EBUS);  Surgeon: Tamea Dedra CROME, MD;  Location: [facility] ORS;  Service: Pulmonary;  Laterality: Left;   ESOPHAGOGASTRODUODENOSCOPY (EGD) WITH PROPOFOL  N/A 11/23/2015   Procedure: ESOPHAGOGASTRODUODENOSCOPY (EGD) WITH PROPOFOL ;  Surgeon: Lamar ONEIDA Holmes, MD;  Location: [facility] ENDOSCOPY;  Service: Endoscopy;  Laterality: N/A;   PACEMAKER IMPLANT N/A 03/27/2023   Procedure: PACEMAKER IMPLANT;  Surgeon: Ammon Blunt, MD;  Location: [facility] INVASIVE CV LAB;  Service: Cardiovascular;  Laterality: N/A;  DDD pacer   TEMPORARY PACEMAKER N/A 03/27/2023   Procedure: TEMPORARY PACEMAKER;  Surgeon: Ammon Blunt, MD;  Location: [facility] INVASIVE CV  LAB;  Service: Cardiovascular;  Laterality: N/A;   TUBAL LIGATION     VIDEO BRONCHOSCOPY WITH ENDOBRONCHIAL NAVIGATION Left 11/06/2023   Procedure: VIDEO BRONCHOSCOPY WITH ENDOBRONCHIAL NAVIGATION;  Surgeon: Tamea Dedra CROME, MD;  Location: [facility] ORS;  Service: Pulmonary;  Laterality: Left;   WISDOM TOOTH EXTRACTION     x 2    SOCIAL HISTORY: Social History   Socioeconomic History   Marital status: Widowed    Spouse name: Not on file   Number of children: Not on file   Years of education: Not on file   Highest education level: Not on file  Occupational History   Not on file  Tobacco Use   Smoking status: Every Day    Current packs/day: 0.25    Average packs/day: 0.7 packs/day for 42.1 years (30.5 ttl pk-yrs)    Types: Cigarettes    Start date: 02/28/1981    Last attempt to quit: 02/28/2021   Smokeless tobacco: Never  Vaping Use   Vaping status: Never Used  Substance and Sexual Activity   Alcohol use: No   Drug use: No   Sexual activity: Not Currently    Birth control/protection: Post-menopausal    Comment: widow since 2000  Other Topics Concern   Not on file  Social History Narrative  Lives with daughter   Social Drivers of Health   Tobacco Use: High Risk (02/20/2024)   Patient History    Smoking Tobacco Use: Every Day    Smokeless Tobacco Use: Never    Passive Exposure: Not on file  Financial Resource Strain: Low Risk  (12/22/2023)   Received from [facility]   Overall Financial Resource Strain (CARDIA)    Difficulty of Paying Living Expenses: Not hard at all  Food Insecurity: No Food Insecurity (12/22/2023)   Received from [facility]   Epic    Within the past 12 months, you worried that your food would run out before you got the money to buy more.: Never true    Within the past 12 months, the food you bought just didn't last and you didn't have money to get more.: Never true  Transportation Needs: No Transportation Needs  (12/22/2023)   Received from [facility] - Transportation    In the past 12 months, has lack of transportation kept you from medical appointments or from getting medications?: No    Lack of Transportation (Non-Medical): No  Physical Activity: Not on file  Stress: Not on file  Social Connections: Moderately Integrated (03/27/2023)   Social Connection and Isolation Panel    Frequency of Communication with Friends and Family: Three times a week    Frequency of Social Gatherings with Friends and Family: Three times a week    Attends Religious Services: More than 4 times per year    Active Member of Clubs or Organizations: Yes    Attends Banker Meetings: 1 to 4 times per year    Marital Status: Widowed  Intimate Partner Violence: Not At Risk (03/26/2023)   Humiliation, Afraid, Rape, and Kick questionnaire    Fear of Current or Ex-Partner: No    Emotionally Abused: No    Physically Abused: No    Sexually Abused: No  Depression (PHQ2-9): Not on file  Alcohol Screen: Not on file  Housing: Low Risk  (01/01/2024)   Received from [facility]   Epic    In the last 12 months, was there a time when you were not able to pay the mortgage or rent on time?: No    In the past 12 months, how many times have you moved where you were living?: 0    At any time in the past 12 months, were you homeless or living in a shelter (including now)?: No  Utilities: Not At Risk (12/22/2023)   Received from [facility]   Epic    In the past 12 months has the electric, gas, oil, or water company threatened to shut off services in your home?: No  Health Literacy: Not on file    FAMILY HISTORY: Family History  Problem Relation Age of Onset   Arthritis Mother    Hyperlipidemia Mother    Hypertension Mother    Hyperlipidemia Sister    Hypertension Sister    Hypertension Daughter    Diabetes Sister    Hyperlipidemia Sister     Hypertension Sister    Hypertension Daughter    Hypertension Daughter    Breast cancer Neg Hx     ALLERGIES:  is allergic to jardiance [empagliflozin], augmentin [amoxicillin-pot clavulanate], and zyrtec [cetirizine hcl].  MEDICATIONS:  Current Outpatient Medications  Medication Sig Dispense Refill   allopurinol  (ZYLOPRIM ) 100 MG tablet Take 100 mg by mouth daily.  amLODipine (NORVASC) 5 MG tablet Take 7.5 mg by mouth.     aspirin  81 MG tablet Take 81 mg by mouth daily.     atorvastatin  (LIPITOR) 40 MG tablet Take 40 mg by mouth daily.     Calcium  Carbonate-Vit D-Min (CALCIUM  1200 PO) Take by mouth. (Patient taking differently: Take by mouth. 1200 mg in the morning and 600 mg in the evening.)     Cyanocobalamin (VITAMIN B-12) 500 MCG SUBL Place 1 tablet under the tongue daily at 6 (six) AM.     cyclobenzaprine (FLEXERIL) 5 MG tablet Take 5 mg by mouth 2 (two) times daily as needed for muscle spasms.     ENTRESTO  49-51 MG Take 1 tablet by mouth 2 (two) times daily.     fluticasone (FLONASE) 50 MCG/ACT nasal spray Place 1 spray into both nostrils daily.     furosemide (LASIX) 20 MG tablet Take 20 mg by mouth every other day.     labetalol  (NORMODYNE ) 200 MG tablet Take 200 mg by mouth 2 (two) times daily.     magnesium  oxide (MAG-OX) 400 (240 Mg) MG tablet Take 400 mg by mouth daily.     omeprazole (PRILOSEC) 40 MG capsule Take 40 mg by mouth daily.     spironolactone  (ALDACTONE ) 25 MG tablet Take 25 mg by mouth daily.     No current facility-administered medications for this visit.     PHYSICAL EXAMINATION: ECOG PERFORMANCE STATUS: 1 - Symptomatic but completely ambulatory Vitals:   02/20/24 1013  BP: 127/71  Pulse: (!) 103  Resp: 18  Temp: (!) 96.8 F (36 C)  SpO2: 97%   Filed Weights   02/20/24 1013  Weight: 199 lb 4.8 oz (90.4 kg)    Physical Exam Constitutional:      General: She is not in acute distress. HENT:     Head: Normocephalic and atraumatic.  Eyes:      General: No scleral icterus. Cardiovascular:     Rate and Rhythm: Normal rate and regular rhythm.     Heart sounds: Normal heart sounds.  Pulmonary:     Effort: Pulmonary effort is normal. No respiratory distress.     Breath sounds: No wheezing.  Abdominal:     General: Bowel sounds are normal. There is no distension.     Palpations: Abdomen is soft.  Musculoskeletal:        General: No deformity. Normal range of motion.     Cervical back: Normal range of motion and neck supple.  Skin:    General: Skin is warm and dry.     Findings: No erythema or rash.  Neurological:     Mental Status: She is alert and oriented to person, place, and time. Mental status is at baseline.     Cranial Nerves: No cranial nerve deficit.     Coordination: Coordination normal.  Psychiatric:        Mood and Affect: Mood normal.   Breast examination was performed. No palpable masses in bilateral breast.  No palpable axillary lymphadenopathy.  LABORATORY DATA:  I have reviewed the data as listed Lab Results  Component Value Date   WBC 6.1 11/02/2023   HGB 11.5 (L) 11/02/2023   HCT 36.2 11/02/2023   MCV 88.7 11/02/2023   PLT 195 11/02/2023   Recent Labs    03/26/23 1246 03/27/23 0333 03/28/23 0429 07/03/23 1314 07/03/23 1448  NA 142 144 139 140  --   K 3.5 3.9 3.6 3.7  --  CL 110 107 105 107  --   CO2 22 24 24 23   --   GLUCOSE 157* 106* 144* 118*  --   BUN 12 12 13 12   --   CREATININE 0.96 0.93 1.01* 1.04*  --   CALCIUM  8.0* 9.5 9.4 7.9* 8.6*  GFRNONAA >60 >60 58* 55*  --   PROT 6.1*  --   --  6.9  --   ALBUMIN 3.6  --   --  4.0  --   AST 19  --   --  20  --   ALT 14  --   --  16  --   ALKPHOS 58  --   --  66  --   BILITOT 0.6  --   --  0.6  --    RADIOGRAPHIC STUDIES: I have personally reviewed the radiological images as listed and agreed with the findings in the report. CT Chest Wo Contrast Result Date: 02/15/2024 CLINICAL DATA:  Pulmonary nodule. EXAM: CT CHEST WITHOUT CONTRAST  TECHNIQUE: Multidetector CT imaging of the chest was performed following the standard protocol without IV contrast. RADIATION DOSE REDUCTION: This exam was performed according to the departmental dose-optimization program which includes automated exposure control, adjustment of the mA and/or kV according to patient size and/or use of iterative reconstruction technique. COMPARISON:  PET-CT 10/31/2023.  Chest CT 10/31/2023. FINDINGS: Cardiovascular: The heart is enlarged. No substantial pericardial effusion. Mild atherosclerotic calcification is noted in the wall of the thoracic aorta. Enlargement of the pulmonary outflow tract/main pulmonary arteries suggests pulmonary arterial hypertension. Mediastinum/Nodes: Multinodular thyroid  enlargement evident. This has been evaluated on previous imaging. (ref: J Am Coll Radiol. 2015 Feb;12(2): 143-50).No mediastinal lymphadenopathy. Calcified nodal tissue in the mediastinum suggest prior granulomatous disease. No evidence for gross hilar lymphadenopathy although assessment is limited by the lack of intravenous contrast on the current study. The esophagus has normal imaging features. There is no axillary lymphadenopathy. Lungs/Pleura: Left apical nodule measures 1.1 x 1.0 cm today, similar to prior. Qualitatively the nodule looks more confluent on the current exam. No new suspicious pulmonary nodule or mass. Subtle changes of emphysema noted. No focal airspace consolidation. No pleural effusion. Upper Abdomen: Diverticular changes are seen in the splenic flexure of the colon. Musculoskeletal: No worrisome lytic or sclerotic osseous abnormality. Stable chronic fluid collection medial left breast, potentially postoperative seroma. IMPRESSION: 1. 1.1 x 1.0 cm left apical nodule is similar to prior. Qualitatively the nodule looks subtly more confluent on the current exam. Nodule showed only low level hypermetabolism on previous PET imaging. 2. Enlargement of the pulmonary outflow  tract/main pulmonary arteries suggests pulmonary arterial hypertension. 3. Multinodular thyroid  enlargement. This has been evaluated on previous imaging. 4. Aortic Atherosclerosis (ICD10-I70.0) and Emphysema (ICD10-J43.9). Electronically Signed   By: Camellia Candle M.D.   On: 02/15/2024 09:34   MM DIAG BREAST TOMO BILATERAL Result Date: 12/13/2023 CLINICAL DATA:  History of LEFT breast lumpectomy for invasive mammary carcinoma in January of 2023 status post adjuvant radiation. Patient is not on endocrine therapy. No new issues today. Due for annual. EXAM: DIGITAL DIAGNOSTIC BILATERAL MAMMOGRAM WITH TOMOSYNTHESIS AND CAD TECHNIQUE: Bilateral digital diagnostic mammography and breast tomosynthesis was performed. The images were evaluated with computer-aided detection. COMPARISON:  Previous exam(s). ACR Breast Density Category c: The breasts are heterogeneously dense, which may obscure small masses. FINDINGS: There is density and architectural distortion within the LEFT breast, corresponding to the site of prior lumpectomy. Spot compression magnification view(s) demonstrate no evidence of recurrent  malignancy. Findings are stable compared to prior. There is no evidence of malignancy in EITHER breast. IMPRESSION: No mammographic evidence of malignancy in EITHER breast status post LEFT breast lumpectomy. RECOMMENDATION: As the patient is now over 2 years out from her lumpectomy, she may return to annual screening mammography in 1 year. Given her history of breast cancer, she remains eligible for annual diagnostic mammography, if preferred. I have discussed the findings and recommendations with the patient. If applicable, a reminder letter will be sent to the patient regarding the next appointment. BI-RADS CATEGORY  2: Benign. Electronically Signed   By: Norleen Croak M.D.   On: 12/13/2023 10:18       "

## 2024-02-20 NOTE — Assessment & Plan Note (Addendum)
 Left lung nodule with low metabolic activity on PET scan. Status post bronchoscopy biopsy, negative for malignancy. Recent CT showed stable lung nodule. Recommend continue monitor and repeat CT in 6 months.

## 2024-03-14 ENCOUNTER — Ambulatory Visit: Admitting: Pulmonary Disease

## 2024-08-07 ENCOUNTER — Other Ambulatory Visit

## 2024-08-15 ENCOUNTER — Ambulatory Visit

## 2024-08-22 ENCOUNTER — Inpatient Hospital Stay: Admitting: Oncology
